# Patient Record
Sex: Female | Born: 1990 | Hispanic: No | Marital: Married | Smoking: Current every day smoker
Health system: Southern US, Community
[De-identification: ages and names within clinical notes are randomized; demographics above are authoritative.]

## PROBLEM LIST (undated history)

## (undated) DIAGNOSIS — I10 Essential (primary) hypertension: Secondary | ICD-10-CM

## (undated) DIAGNOSIS — E119 Type 2 diabetes mellitus without complications: Secondary | ICD-10-CM

## (undated) DIAGNOSIS — F988 Other specified behavioral and emotional disorders with onset usually occurring in childhood and adolescence: Secondary | ICD-10-CM

## (undated) DIAGNOSIS — J45909 Unspecified asthma, uncomplicated: Secondary | ICD-10-CM

## (undated) DIAGNOSIS — K769 Liver disease, unspecified: Secondary | ICD-10-CM

## (undated) DIAGNOSIS — F1010 Alcohol abuse, uncomplicated: Secondary | ICD-10-CM

---
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
## 2012-08-31 ENCOUNTER — Other Ambulatory Visit: Payer: Self-pay | Admitting: Family Medicine

## 2012-08-31 ENCOUNTER — Other Ambulatory Visit (HOSPITAL_COMMUNITY)
Admission: RE | Admit: 2012-08-31 | Discharge: 2012-08-31 | Disposition: A | Discharge status: Home / Self Care | Payer: BC Managed Care – PPO | Source: Ambulatory Visit | Attending: Family Medicine | Admitting: Family Medicine

## 2012-08-31 DIAGNOSIS — Z124 Encounter for screening for malignant neoplasm of cervix: Secondary | ICD-10-CM | POA: Insufficient documentation

## 2015-01-08 ENCOUNTER — Encounter (HOSPITAL_COMMUNITY): Payer: Self-pay | Admitting: Emergency Medicine

## 2015-01-08 ENCOUNTER — Emergency Department (HOSPITAL_COMMUNITY)
Admission: EM | Admit: 2015-01-08 | Discharge: 2015-01-09 | Disposition: A | Discharge status: Home / Self Care | Payer: BC Managed Care – PPO | Attending: Emergency Medicine | Admitting: Emergency Medicine

## 2015-01-08 DIAGNOSIS — J45909 Unspecified asthma, uncomplicated: Secondary | ICD-10-CM | POA: Insufficient documentation

## 2015-01-08 DIAGNOSIS — Z720 Tobacco use: Secondary | ICD-10-CM | POA: Insufficient documentation

## 2015-01-08 DIAGNOSIS — T24211A Burn of second degree of right thigh, initial encounter: Secondary | ICD-10-CM | POA: Insufficient documentation

## 2015-01-08 DIAGNOSIS — T24011A Burn of unspecified degree of right thigh, initial encounter: Secondary | ICD-10-CM | POA: Diagnosis present

## 2015-01-08 DIAGNOSIS — T2102XA Burn of unspecified degree of abdominal wall, initial encounter: Secondary | ICD-10-CM | POA: Insufficient documentation

## 2015-01-08 DIAGNOSIS — Y92009 Unspecified place in unspecified non-institutional (private) residence as the place of occurrence of the external cause: Secondary | ICD-10-CM | POA: Insufficient documentation

## 2015-01-08 DIAGNOSIS — F909 Attention-deficit hyperactivity disorder, unspecified type: Secondary | ICD-10-CM | POA: Insufficient documentation

## 2015-01-08 DIAGNOSIS — Y998 Other external cause status: Secondary | ICD-10-CM | POA: Diagnosis not present

## 2015-01-08 DIAGNOSIS — Z23 Encounter for immunization: Secondary | ICD-10-CM | POA: Diagnosis not present

## 2015-01-08 DIAGNOSIS — Z79899 Other long term (current) drug therapy: Secondary | ICD-10-CM | POA: Diagnosis not present

## 2015-01-08 DIAGNOSIS — X12XXXA Contact with other hot fluids, initial encounter: Secondary | ICD-10-CM | POA: Diagnosis not present

## 2015-01-08 DIAGNOSIS — Y9389 Activity, other specified: Secondary | ICD-10-CM | POA: Insufficient documentation

## 2015-01-08 DIAGNOSIS — T300 Burn of unspecified body region, unspecified degree: Secondary | ICD-10-CM

## 2015-01-08 DIAGNOSIS — I10 Essential (primary) hypertension: Secondary | ICD-10-CM | POA: Insufficient documentation

## 2015-01-08 HISTORY — DX: Other specified behavioral and emotional disorders with onset usually occurring in childhood and adolescence: F98.8

## 2015-01-08 HISTORY — DX: Unspecified asthma, uncomplicated: J45.909

## 2015-01-08 HISTORY — DX: Essential (primary) hypertension: I10

## 2015-01-08 MED ORDER — SILVER SULFADIAZINE 1 % EX CREA: 1.0000 "application " | TOPICAL_CREAM | Freq: Every day | CUTANEOUS | Status: DC

## 2015-01-08 MED ORDER — TETANUS-DIPHTH-ACELL PERTUSSIS 5-2.5-18.5 LF-MCG/0.5 IM SUSP
0.5000 mL | Freq: Once | INTRAMUSCULAR | Status: AC
  Administered 2015-01-09: 0.5 mL via INTRAMUSCULAR
  Filled 2015-01-08: qty 0.5

## 2015-01-08 MED ORDER — SILVER SULFADIAZINE 1 % EX CREA
TOPICAL_CREAM | Freq: Two times a day (BID) | CUTANEOUS | Status: DC
  Administered 2015-01-09: 01:00:00 via TOPICAL
  Filled 2015-01-08: qty 85

## 2015-01-08 MED ORDER — OXYCODONE-ACETAMINOPHEN 5-325 MG PO TABS
1.0000 | ORAL_TABLET | Freq: Once | ORAL | Status: AC
  Administered 2015-01-08: 1 via ORAL

## 2015-01-08 MED ORDER — OXYCODONE-ACETAMINOPHEN 5-325 MG PO TABS: 2.0000 | ORAL_TABLET | ORAL | Status: DC | PRN

## 2015-01-08 MED ORDER — OXYCODONE-ACETAMINOPHEN 5-325 MG PO TABS
ORAL_TABLET | ORAL | Status: AC
  Filled 2015-01-08: qty 1

## 2015-01-08 MED ORDER — KETOROLAC TROMETHAMINE 60 MG/2ML IM SOLN
60.0000 mg | Freq: Once | INTRAMUSCULAR | Status: AC
  Administered 2015-01-09: 60 mg via INTRAMUSCULAR
  Filled 2015-01-08: qty 2

## 2015-01-08 NOTE — Discharge Instructions (Signed)
Burn Care Your skin is a natural barrier to infection. It is the largest organ of your body. Burns damage this natural protection. To help prevent infection, it is very important to follow your caregiver's instructions in the care of your burn. Burns are classified as:  First degree. There is only redness of the skin (erythema). No scarring is expected.  Second degree. There is blistering of the skin. Scarring may occur with deeper burns.  Third degree. All layers of the skin are injured, and scarring is expected. HOME CARE INSTRUCTIONS   Wash your hands well before changing your bandage.  Change your bandage as often as directed by your caregiver.  Remove the old bandage. If the bandage sticks, you may soak it off with cool, clean water.  Cleanse the burn thoroughly but gently with mild soap and water.  Pat the area dry with a clean, dry cloth.  Apply a thin layer of antibacterial cream to the burn.  Apply a clean bandage as instructed by your caregiver.  Keep the bandage as clean and dry as possible.  Elevate the affected area for the first 24 hours, then as instructed by your caregiver.  Only take over-the-counter or prescription medicines for pain, discomfort, or fever as directed by your caregiver. SEEK IMMEDIATE MEDICAL CARE IF:   You develop excessive pain.  You develop redness, tenderness, swelling, or red streaks near the burn.  The burned area develops yellowish-white fluid (pus) or a bad smell.  You have a fever. MAKE SURE YOU:   Understand these instructions.  Will watch your condition.  Will get help right away if you are not doing well or get worse. Document Released: 06/29/2005 Document Revised: 09/21/2011 Document Reviewed: 11/19/2010 [employer] Patient Information 2015 [employer]. This information is not intended to replace advice given to you by your health care provider. Make sure you discuss any questions you have with your health care  provider.  
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
## 2015-01-09 NOTE — ED Notes (Signed)
Pt verbalized understanding of silvadene use and pain medication use and has no further questions. Pt in NAD upon d/c.

## 2015-12-05 ENCOUNTER — Emergency Department (HOSPITAL_COMMUNITY): Payer: BC Managed Care – PPO

## 2015-12-05 ENCOUNTER — Inpatient Hospital Stay (HOSPITAL_COMMUNITY)
Admission: EM | Admit: 2015-12-05 | Discharge: 2015-12-13 | DRG: 433 | Disposition: A | Discharge status: Home / Self Care | Payer: BC Managed Care – PPO | Attending: Internal Medicine | Admitting: Internal Medicine

## 2015-12-05 ENCOUNTER — Encounter (HOSPITAL_COMMUNITY): Payer: Self-pay | Admitting: Emergency Medicine

## 2015-12-05 DIAGNOSIS — K7031 Alcoholic cirrhosis of liver with ascites: Secondary | ICD-10-CM | POA: Diagnosis not present

## 2015-12-05 DIAGNOSIS — R1011 Right upper quadrant pain: Secondary | ICD-10-CM | POA: Diagnosis not present

## 2015-12-05 DIAGNOSIS — R188 Other ascites: Secondary | ICD-10-CM | POA: Diagnosis present

## 2015-12-05 DIAGNOSIS — I10 Essential (primary) hypertension: Secondary | ICD-10-CM | POA: Diagnosis present

## 2015-12-05 DIAGNOSIS — F10229 Alcohol dependence with intoxication, unspecified: Secondary | ICD-10-CM | POA: Diagnosis present

## 2015-12-05 DIAGNOSIS — R05 Cough: Secondary | ICD-10-CM

## 2015-12-05 DIAGNOSIS — K7290 Hepatic failure, unspecified without coma: Secondary | ICD-10-CM | POA: Diagnosis present

## 2015-12-05 DIAGNOSIS — K7011 Alcoholic hepatitis with ascites: Secondary | ICD-10-CM | POA: Diagnosis present

## 2015-12-05 DIAGNOSIS — E871 Hypo-osmolality and hyponatremia: Secondary | ICD-10-CM | POA: Diagnosis present

## 2015-12-05 DIAGNOSIS — K766 Portal hypertension: Secondary | ICD-10-CM | POA: Diagnosis present

## 2015-12-05 DIAGNOSIS — Y908 Blood alcohol level of 240 mg/100 ml or more: Secondary | ICD-10-CM | POA: Diagnosis present

## 2015-12-05 DIAGNOSIS — R059 Cough, unspecified: Secondary | ICD-10-CM

## 2015-12-05 DIAGNOSIS — R000 Tachycardia, unspecified: Secondary | ICD-10-CM | POA: Diagnosis present

## 2015-12-05 DIAGNOSIS — F10929 Alcohol use, unspecified with intoxication, unspecified: Secondary | ICD-10-CM

## 2015-12-05 DIAGNOSIS — E876 Hypokalemia: Secondary | ICD-10-CM | POA: Diagnosis present

## 2015-12-05 DIAGNOSIS — F1010 Alcohol abuse, uncomplicated: Secondary | ICD-10-CM | POA: Insufficient documentation

## 2015-12-05 DIAGNOSIS — K7200 Acute and subacute hepatic failure without coma: Secondary | ICD-10-CM

## 2015-12-05 DIAGNOSIS — D689 Coagulation defect, unspecified: Secondary | ICD-10-CM | POA: Diagnosis present

## 2015-12-05 DIAGNOSIS — D684 Acquired coagulation factor deficiency: Secondary | ICD-10-CM | POA: Diagnosis present

## 2015-12-05 DIAGNOSIS — F10239 Alcohol dependence with withdrawal, unspecified: Secondary | ICD-10-CM | POA: Diagnosis present

## 2015-12-05 DIAGNOSIS — F909 Attention-deficit hyperactivity disorder, unspecified type: Secondary | ICD-10-CM | POA: Diagnosis present

## 2015-12-05 DIAGNOSIS — D72829 Elevated white blood cell count, unspecified: Secondary | ICD-10-CM | POA: Diagnosis present

## 2015-12-05 LAB — URINALYSIS, ROUTINE W REFLEX MICROSCOPIC
Glucose, UA: NEGATIVE mg/dL
Hgb urine dipstick: NEGATIVE
KETONES UR: NEGATIVE mg/dL
Leukocytes, UA: NEGATIVE
NITRITE: POSITIVE — AB
PH: 6.5 (ref 5.0–8.0)
Protein, ur: NEGATIVE mg/dL
Specific Gravity, Urine: 1.014 (ref 1.005–1.030)

## 2015-12-05 LAB — URINE MICROSCOPIC-ADD ON

## 2015-12-05 LAB — I-STAT BETA HCG BLOOD, ED (MC, WL, AP ONLY)

## 2015-12-05 LAB — CBC
HCT: 32.7 % — ABNORMAL LOW (ref 36.0–46.0)
Hemoglobin: 10.9 g/dL — ABNORMAL LOW (ref 12.0–15.0)
MCH: 26.8 pg (ref 26.0–34.0)
MCHC: 33.3 g/dL (ref 30.0–36.0)
MCV: 80.5 fL (ref 78.0–100.0)
PLATELETS: 346 10*3/uL (ref 150–400)
RBC: 4.06 MIL/uL (ref 3.87–5.11)
RDW: 17.7 % — AB (ref 11.5–15.5)
WBC: 24.8 10*3/uL — AB (ref 4.0–10.5)

## 2015-12-05 LAB — HEPATIC FUNCTION PANEL
ALK PHOS: 300 U/L — AB (ref 38–126)
ALT: 11 U/L — AB (ref 14–54)
AST: 162 U/L — ABNORMAL HIGH (ref 15–41)
Albumin: 3.4 g/dL — ABNORMAL LOW (ref 3.5–5.0)
BILIRUBIN INDIRECT: 1 mg/dL — AB (ref 0.3–0.9)
Bilirubin, Direct: 1.3 mg/dL — ABNORMAL HIGH (ref 0.1–0.5)
TOTAL PROTEIN: 7.9 g/dL (ref 6.5–8.1)
Total Bilirubin: 2.3 mg/dL — ABNORMAL HIGH (ref 0.3–1.2)

## 2015-12-05 LAB — BASIC METABOLIC PANEL
Anion gap: 14 (ref 5–15)
CO2: 24 mmol/L (ref 22–32)
Calcium: 7.9 mg/dL — ABNORMAL LOW (ref 8.9–10.3)
Chloride: 100 mmol/L — ABNORMAL LOW (ref 101–111)
Creatinine, Ser: 0.39 mg/dL — ABNORMAL LOW (ref 0.44–1.00)
GFR calc Af Amer: >60 mL/min (ref >60)
GLUCOSE: 147 mg/dL — AB (ref 65–99)
POTASSIUM: 3.3 mmol/L — AB (ref 3.5–5.1)
Sodium: 138 mmol/L (ref 135–145)

## 2015-12-05 LAB — ETHANOL: Alcohol, Ethyl (B): 303 mg/dL (ref <5)

## 2015-12-05 LAB — LIPASE, BLOOD: Lipase: 60 U/L — ABNORMAL HIGH (ref 11–51)

## 2015-12-05 LAB — PROTIME-INR
INR: 1.51 — ABNORMAL HIGH (ref 0.00–1.49)
Prothrombin Time: 18.3 seconds — ABNORMAL HIGH (ref 11.6–15.2)

## 2015-12-05 MED ORDER — SODIUM CHLORIDE 0.9 % IV BOLUS (SEPSIS): 500.0000 mL | Freq: Once | INTRAVENOUS | Status: DC

## 2015-12-05 MED ORDER — SODIUM CHLORIDE 0.9 % IV BOLUS (SEPSIS): 1000.0000 mL | Freq: Once | INTRAVENOUS | Status: DC

## 2015-12-05 MED ORDER — IOPAMIDOL (ISOVUE-300) INJECTION 61%
100.0000 mL | Freq: Once | INTRAVENOUS | Status: AC | PRN
  Administered 2015-12-05: 100 mL via INTRAVENOUS

## 2015-12-05 NOTE — ED Notes (Signed)
Pt presents with very distended abdomen x 2.5 weeks. States she is a heavy drinker. SOB. Alert and oriented.

## 2015-12-05 NOTE — ED Notes (Signed)
Pt in CT.

## 2015-12-05 NOTE — ED Notes (Signed)
PA at bedside.

## 2015-12-05 NOTE — ED Provider Notes (Signed)
CSN: [id number]     Arrival date & time 12/05/15  2005 History   First MD Initiated Contact with Patient 12/05/15 2036     Chief Complaint  Patient presents with  . Abdominal Pain    Angela Villegas is a 25 y.o. female with a history of alcohol abuse who presents to the ED complaining of abdominal pain and distention for the past 2.5 weeks. Patient reports she began having abdominal pain and distention worsen her epigastric and right upper quadrant area over the past 2 and half weeks. She reports her abdomen has been getting progressively larger. She's not been evaluated for this complaint previously. She reports she drinks 10 drinks of vodka a day for a long period of time. She reports nausea but no vomiting. She last vomited 4 days ago. No previous abdominal surgeries. She reports having frequent nose bleeds recently.  No history of liver problems. She denies fevers, diarrhea, vomiting, urinary symptoms, chest pain, shortness of breath, hematemesis, hematuria or hemoptysis. or rashes.    Patient is a 25 y.o. female presenting with abdominal pain. The history is provided by the patient. No language interpreter was used.  Abdominal Pain Associated symptoms: nausea   Associated symptoms: no chest pain, no chills, no cough, no diarrhea, no dysuria, no fever, no hematuria, no shortness of breath, no sore throat, no vaginal bleeding, no vaginal discharge and no vomiting     Past Medical History  Diagnosis Date  . ADD (attention deficit disorder)   . Asthma   . Hypertension    History reviewed. No pertinent past surgical history. History reviewed. No pertinent family history. Social History  Substance Use Topics  . Smoking status: Current Every Day Smoker  . Smokeless tobacco: None  . Alcohol Use: Yes   OB History    No data available     Review of Systems  Constitutional: Negative for fever and chills.  HENT: Positive for nosebleeds. Negative for congestion and sore throat.   Eyes:  Negative for visual disturbance.  Respiratory: Negative for cough and shortness of breath.   Cardiovascular: Negative for chest pain.  Gastrointestinal: Positive for nausea, abdominal pain and abdominal distention. Negative for vomiting, diarrhea, blood in stool and anal bleeding.  Genitourinary: Negative for dysuria, urgency, hematuria, vaginal bleeding, vaginal discharge and difficulty urinating.  Musculoskeletal: Negative for back pain and neck pain.  Skin: Negative for rash.  Neurological: Negative for headaches.      Allergies  Vicodin  Home Medications   Prior to Admission medications   Medication Sig Start Date End Date Taking? Authorizing Provider  amLODipine (NORVASC) 5 MG tablet Take 5 mg by mouth daily.   Yes Historical Provider, MD  amphetamine-dextroamphetamine (ADDERALL XR) 15 MG 24 hr capsule Take 15 mg by mouth every morning.   Yes Historical Provider, MD  amphetamine-dextroamphetamine (ADDERALL) 10 MG tablet Take 10 mg by mouth every evening.  12/21/14  Yes Historical Provider, MD  fluticasone (FLONASE) 50 MCG/ACT nasal spray Place 2 sprays into both nostrils daily.   Yes Historical Provider, MD  guaifenesin (ROBITUSSIN) 100 MG/5ML syrup Take 200 mg by mouth 3 (three) times daily as needed for cough.   Yes Historical Provider, MD  propranolol (INDERAL) 10 MG tablet Take 10 mg by mouth 2 (two) times daily.   Yes Historical Provider, MD  traMADol (ULTRAM) 50 MG tablet Take 50 mg by mouth every 6 (six) hours as needed for moderate pain or severe pain.   Yes Historical Provider, MD  ibuprofen (ADVIL,MOTRIN) 200 MG tablet Take 400 mg by mouth every 6 (six) hours as needed for headache or mild pain.    Historical Provider, MD  oxyCODONE-acetaminophen (PERCOCET) 5-325 MG per tablet Take 2 tablets by mouth every 4 (four) hours as needed. Patient not taking: Reported on 12/05/2015 01/08/15   Gilda Crease, MD  silver sulfADIAZINE (SILVADENE) 1 % cream Apply 1 application  topically daily. Patient not taking: Reported on 12/05/2015 01/08/15   Gilda Crease, MD   BP 112/90 mmHg  Pulse 132  Temp(Src) 98.1 F (36.7 C) (Oral)  Resp 25  SpO2 92%  LMP 10/23/2015 (Approximate) Physical Exam  Constitutional: She is oriented to person, place, and time. She appears well-developed and well-nourished. No distress.  HENT:  Head: Normocephalic and atraumatic.  Mouth/Throat: Oropharynx is clear and moist.  Eyes: Conjunctivae are normal. Pupils are equal, round, and reactive to light. Right eye exhibits no discharge. Left eye exhibits no discharge.  Neck: Neck supple.  Cardiovascular: Regular rhythm, normal heart sounds and intact distal pulses.  Exam reveals no gallop and no friction rub.   No murmur heard. Tachycardic at 120  Pulmonary/Chest: Effort normal and breath sounds normal. No respiratory distress. She has no wheezes. She has no rales.  Lungs clear to auscultation bilaterally.  Abdominal: Soft. She exhibits distension. There is tenderness.  Abdomen is distended. Evidence of ascites and tympany to percussion. Mild tenderness to epigastrium and RUQ.   Musculoskeletal: She exhibits no edema.  Lymphadenopathy:    She has no cervical adenopathy.  Neurological: She is alert and oriented to person, place, and time. Coordination normal.  Skin: Skin is warm and dry. No rash noted. She is not diaphoretic. No erythema. No pallor.  Psychiatric: She has a normal mood and affect. Her behavior is normal.  Nursing note and vitals reviewed.   ED Course  Procedures (including critical care time) Labs Review Labs Reviewed  LIPASE, BLOOD - Abnormal; Notable for the following:    Lipase 60 (*)    All other components within normal limits  CBC - Abnormal; Notable for the following:    WBC 24.8 (*)    Hemoglobin 10.9 (*)    HCT 32.7 (*)    RDW 17.7 (*)    All other components within normal limits  URINALYSIS, ROUTINE W REFLEX MICROSCOPIC (NOT AT [facility]) -  Abnormal; Notable for the following:    Color, Urine AMBER (*)    APPearance TURBID (*)    Bilirubin Urine MODERATE (*)    Nitrite POSITIVE (*)    All other components within normal limits  ETHANOL - Abnormal; Notable for the following:    Alcohol, Ethyl (B) 303 (*)    All other components within normal limits  PROTIME-INR - Abnormal; Notable for the following:    Prothrombin Time 18.3 (*)    INR 1.51 (*)    All other components within normal limits  HEPATIC FUNCTION PANEL - Abnormal; Notable for the following:    Albumin 3.4 (*)    AST 162 (*)    ALT 11 (*)    Alkaline Phosphatase 300 (*)    Total Bilirubin 2.3 (*)    Bilirubin, Direct 1.3 (*)    Indirect Bilirubin 1.0 (*)    All other components within normal limits  BASIC METABOLIC PANEL - Abnormal; Notable for the following:    Potassium 3.3 (*)    Chloride 100 (*)    Glucose, Bld 147 (*)    BUN <5 (*)  Creatinine, Ser 0.39 (*)    Calcium 7.9 (*)    All other components within normal limits  URINE MICROSCOPIC-ADD ON - Abnormal; Notable for the following:    Squamous Epithelial / LPF TOO NUMEROUS TO COUNT (*)    Bacteria, UA MANY (*)    All other components within normal limits  CULTURE, BLOOD (ROUTINE X 2)  CULTURE, BLOOD (ROUTINE X 2)  I-STAT BETA HCG BLOOD, ED ([facility], [facility], [facility] ONLY)    Imaging Review Ct Abdomen Pelvis W Contrast  12/05/2015  CLINICAL DATA:  Abdominal distention for 2 weeks. EXAM: CT ABDOMEN AND PELVIS WITH CONTRAST TECHNIQUE: Multidetector CT imaging of the abdomen and pelvis was performed using the standard protocol following bolus administration of intravenous contrast. CONTRAST:  100mL ISOVUE-300 IOPAMIDOL (ISOVUE-300) INJECTION 61% COMPARISON:  None. FINDINGS: Lower chest: Linear atelectasis in the right greater than left lower lobe. No pleural effusion. Liver: Markedly enlarged and heterogeneous. There is diffusely decreased hepatic density. More confluent subcapsular hypodensity in the anterior right  anterior and superior anterior left lobes with mild associated volume loss. More confluent hypodensity in the caudate. Rounded fluid density structures adjacent the gallbladder fossa may be loculated fluid or more confluent hepatic hypodensity. There is blood flow in the portal veins. Hepatobiliary: Sludge in the gallbladder without calcified stone. No biliary dilatation. Pancreas: No ductal dilatation or inflammation. Spleen: Normal in size. Adrenal glands: No nodule. Kidneys: Symmetric renal enhancement.  No hydronephrosis. Stomach/Bowel: Stomach physiologically distended. There is mild small bowel thickening involving left upper quadrant bowel loops. More distal small bowel loops are decompressed. Small volume of stool throughout the colon without colonic wall thickening. The appendix is normal. Vascular/Lymphatic: No retroperitoneal adenopathy. Abdominal aorta is normal in caliber. Reproductive: Uterus and ovaries are normal for age. Bladder: Physiologically distended without wall thickening. Other: Large volume of intra-abdominal ascites.  No free air. Musculoskeletal: There are no acute or suspicious osseous abnormalities. IMPRESSION: 1. Abnormal appearance of the liver. There is hepatomegaly, heterogeneous parenchyma and diffusely decreased density. More confluent regions of hypodensity in the anterior right and superior anterior left lobes with mild associated volume loss. Overall findings consistent with advanced liver disease, and possible hepatic failure. The more confluent regions suspicious for hepatocellular necrosis. 2. Large volume intra-abdominal ascites. These results were called by telephone at the time of interpretation on 12/05/2015 at 11:34 pm to Dr. Estell HarpinZammit , who verbally acknowledged these results. Electronically Signed   By: Rubye OaksMelanie  Ehinger M.D.   On: 12/05/2015 23:36   I have personally reviewed and evaluated these images and lab results as part of my medical decision-making.   EKG  Interpretation None      Filed Vitals:   12/05/15 2232 12/05/15 2234 12/06/15 0012 12/06/15 0036  BP:  133/104 143/104 112/90  Pulse: 132  127 132  Temp:      TempSrc:      Resp:    25  SpO2: 93%   92%     MDM   Meds given in ED:  Medications  iopamidol (ISOVUE-300) 61 % injection 100 mL (100 mLs Intravenous Contrast Given 12/05/15 2305)  piperacillin-tazobactam (ZOSYN) IVPB 3.375 g (0 g Intravenous Stopped 12/06/15 0045)    New Prescriptions   No medications on file    Final diagnoses:  Acute liver failure without hepatic coma  Acute alcohol intoxication, with unspecified complication (HCC)  Ascites   This is a 25 y.o. female with a history of alcohol abuse who presents to the ED complaining of  abdominal pain and distention for the past 2.5 weeks. Patient reports she began having abdominal pain and distention worsen her epigastric and right upper quadrant area over the past 2 and half weeks. She reports her abdomen has been getting progressively larger. She's not been evaluated for this complaint previously. She reports she drinks 10 drinks of vodka a day for a long period of time. She reports nausea but no vomiting. She last vomited 4 days ago. No previous abdominal surgeries. She reports having frequent nose bleeds recently.  No history of liver problems.  On exam the patient is nontoxic. She has massive abdominal distention with ascites. She is acutely intoxicated with an alcohol level of 303. She has a negative pregnancy test. She has a leukocytosis with a white count of 24,000. She has a nitrate positive urine with negative leukocytes. Urine sent for culture. Her INR is elevated at 1.51. Hepatic function panel shows a total bilirubin of 2.3. Alkaline phosphatase is elevated at 300. CT abdomen and pelvis reveals abnormal appearance of the liver. Hepatomegaly. Large volume ascites. Findings consistent with hepatic failure. Will start on Zosyn. Blood cultures were able to be  obtained before antibiotics were started.  I consulted for admission with Dr. Toniann Fail who accepted the patient for admission.   This patient was discussed with Dr. Estell Harpin who agrees with assessment and plan.    Everlene Farrier, PA-C 12/06/15 0157  Bethann Berkshire, MD 12/07/15 [phone]

## 2015-12-06 ENCOUNTER — Inpatient Hospital Stay (HOSPITAL_COMMUNITY): Payer: BC Managed Care – PPO

## 2015-12-06 ENCOUNTER — Encounter (HOSPITAL_COMMUNITY): Payer: Self-pay | Admitting: Internal Medicine

## 2015-12-06 DIAGNOSIS — K7010 Alcoholic hepatitis without ascites: Secondary | ICD-10-CM | POA: Insufficient documentation

## 2015-12-06 DIAGNOSIS — R188 Other ascites: Secondary | ICD-10-CM | POA: Diagnosis not present

## 2015-12-06 DIAGNOSIS — D72829 Elevated white blood cell count, unspecified: Secondary | ICD-10-CM | POA: Diagnosis present

## 2015-12-06 DIAGNOSIS — D689 Coagulation defect, unspecified: Secondary | ICD-10-CM | POA: Diagnosis not present

## 2015-12-06 DIAGNOSIS — R7989 Other specified abnormal findings of blood chemistry: Secondary | ICD-10-CM | POA: Diagnosis not present

## 2015-12-06 DIAGNOSIS — F10929 Alcohol use, unspecified with intoxication, unspecified: Secondary | ICD-10-CM | POA: Insufficient documentation

## 2015-12-06 DIAGNOSIS — F10239 Alcohol dependence with withdrawal, unspecified: Secondary | ICD-10-CM | POA: Diagnosis present

## 2015-12-06 DIAGNOSIS — F10229 Alcohol dependence with intoxication, unspecified: Secondary | ICD-10-CM | POA: Diagnosis present

## 2015-12-06 DIAGNOSIS — D684 Acquired coagulation factor deficiency: Secondary | ICD-10-CM | POA: Diagnosis present

## 2015-12-06 DIAGNOSIS — K7011 Alcoholic hepatitis with ascites: Secondary | ICD-10-CM | POA: Diagnosis not present

## 2015-12-06 DIAGNOSIS — K766 Portal hypertension: Secondary | ICD-10-CM | POA: Diagnosis present

## 2015-12-06 DIAGNOSIS — Y908 Blood alcohol level of 240 mg/100 ml or more: Secondary | ICD-10-CM | POA: Diagnosis present

## 2015-12-06 DIAGNOSIS — K7031 Alcoholic cirrhosis of liver with ascites: Secondary | ICD-10-CM | POA: Diagnosis present

## 2015-12-06 DIAGNOSIS — K7200 Acute and subacute hepatic failure without coma: Secondary | ICD-10-CM | POA: Diagnosis not present

## 2015-12-06 DIAGNOSIS — F10129 Alcohol abuse with intoxication, unspecified: Secondary | ICD-10-CM | POA: Diagnosis not present

## 2015-12-06 DIAGNOSIS — I10 Essential (primary) hypertension: Secondary | ICD-10-CM | POA: Diagnosis present

## 2015-12-06 DIAGNOSIS — R000 Tachycardia, unspecified: Secondary | ICD-10-CM | POA: Diagnosis present

## 2015-12-06 DIAGNOSIS — R1011 Right upper quadrant pain: Secondary | ICD-10-CM | POA: Diagnosis present

## 2015-12-06 DIAGNOSIS — F1010 Alcohol abuse, uncomplicated: Secondary | ICD-10-CM | POA: Insufficient documentation

## 2015-12-06 DIAGNOSIS — K7290 Hepatic failure, unspecified without coma: Secondary | ICD-10-CM | POA: Diagnosis present

## 2015-12-06 DIAGNOSIS — E871 Hypo-osmolality and hyponatremia: Secondary | ICD-10-CM | POA: Diagnosis present

## 2015-12-06 DIAGNOSIS — F909 Attention-deficit hyperactivity disorder, unspecified type: Secondary | ICD-10-CM | POA: Diagnosis present

## 2015-12-06 DIAGNOSIS — E876 Hypokalemia: Secondary | ICD-10-CM | POA: Diagnosis present

## 2015-12-06 LAB — CBC
HCT: 30.5 % — ABNORMAL LOW (ref 36.0–46.0)
Hemoglobin: 10.1 g/dL — ABNORMAL LOW (ref 12.0–15.0)
MCH: 26.9 pg (ref 26.0–34.0)
MCHC: 33.1 g/dL (ref 30.0–36.0)
MCV: 81.1 fL (ref 78.0–100.0)
PLATELETS: 337 10*3/uL (ref 150–400)
RBC: 3.76 MIL/uL — AB (ref 3.87–5.11)
RDW: 18.3 % — AB (ref 11.5–15.5)
WBC: 20.3 10*3/uL — AB (ref 4.0–10.5)

## 2015-12-06 LAB — RAPID URINE DRUG SCREEN, HOSP PERFORMED
AMPHETAMINES: NOT DETECTED
BENZODIAZEPINES: POSITIVE — AB
Barbiturates: NOT DETECTED
Cocaine: NOT DETECTED
OPIATES: POSITIVE — AB
TETRAHYDROCANNABINOL: NOT DETECTED

## 2015-12-06 LAB — BASIC METABOLIC PANEL
Anion gap: 11 (ref 5–15)
CALCIUM: 7.6 mg/dL — AB (ref 8.9–10.3)
CHLORIDE: 101 mmol/L (ref 101–111)
CO2: 25 mmol/L (ref 22–32)
CREATININE: 0.31 mg/dL — AB (ref 0.44–1.00)
GFR calc Af Amer: >60 mL/min (ref >60)
GFR calc non Af Amer: >60 mL/min (ref >60)
Glucose, Bld: 108 mg/dL — ABNORMAL HIGH (ref 65–99)
Potassium: 3.7 mmol/L (ref 3.5–5.1)
SODIUM: 137 mmol/L (ref 135–145)

## 2015-12-06 LAB — HEPATIC FUNCTION PANEL
ALT: 11 U/L — AB (ref 14–54)
AST: 137 U/L — AB (ref 15–41)
Albumin: 3.1 g/dL — ABNORMAL LOW (ref 3.5–5.0)
Alkaline Phosphatase: 256 U/L — ABNORMAL HIGH (ref 38–126)
BILIRUBIN DIRECT: 1.4 mg/dL — AB (ref 0.1–0.5)
BILIRUBIN INDIRECT: 1.2 mg/dL — AB (ref 0.3–0.9)
TOTAL PROTEIN: 7.1 g/dL (ref 6.5–8.1)
Total Bilirubin: 2.6 mg/dL — ABNORMAL HIGH (ref 0.3–1.2)

## 2015-12-06 LAB — GRAM STAIN

## 2015-12-06 LAB — GLUCOSE, SEROUS FLUID: Glucose, Fluid: 113 mg/dL

## 2015-12-06 LAB — BODY FLUID CELL COUNT WITH DIFFERENTIAL
Eos, Fluid: 0 %
Lymphs, Fluid: 55 %
Monocyte-Macrophage-Serous Fluid: 39 % — ABNORMAL LOW (ref 50–90)
NEUTROPHIL FLUID: 6 % (ref 0–25)
WBC FLUID: 236 uL (ref 0–1000)

## 2015-12-06 LAB — ALBUMIN, FLUID (OTHER): Albumin, Fluid: 2.2 g/dL

## 2015-12-06 LAB — PROTEIN, BODY FLUID: Total protein, fluid: 4.6 g/dL

## 2015-12-06 LAB — LACTATE DEHYDROGENASE, PLEURAL OR PERITONEAL FLUID: LD, Fluid: 141 U/L — ABNORMAL HIGH (ref 3–23)

## 2015-12-06 LAB — MRSA PCR SCREENING: MRSA by PCR: POSITIVE — AB

## 2015-12-06 MED ORDER — ONDANSETRON HCL 4 MG PO TABS
4.0000 mg | ORAL_TABLET | Freq: Four times a day (QID) | ORAL | Status: DC | PRN
  Administered 2015-12-10: 4 mg via ORAL
  Filled 2015-12-06: qty 1

## 2015-12-06 MED ORDER — ADULT MULTIVITAMIN W/MINERALS CH
1.0000 | ORAL_TABLET | Freq: Every day | ORAL | Status: DC
  Administered 2015-12-06 – 2015-12-13 (×8): 1 via ORAL
  Filled 2015-12-06 (×8): qty 1

## 2015-12-06 MED ORDER — LORAZEPAM 2 MG/ML IJ SOLN
0.0000 mg | Freq: Four times a day (QID) | INTRAMUSCULAR | Status: AC
  Administered 2015-12-06 – 2015-12-07 (×3): 2 mg via INTRAVENOUS
  Filled 2015-12-06 (×3): qty 1

## 2015-12-06 MED ORDER — MORPHINE SULFATE (PF) 2 MG/ML IV SOLN
2.0000 mg | INTRAVENOUS | Status: DC | PRN
  Administered 2015-12-06 – 2015-12-08 (×6): 2 mg via INTRAVENOUS
  Filled 2015-12-06 (×6): qty 1

## 2015-12-06 MED ORDER — LORAZEPAM 1 MG PO TABS
1.0000 mg | ORAL_TABLET | Freq: Four times a day (QID) | ORAL | Status: AC | PRN
  Administered 2015-12-07: 1 mg via ORAL
  Filled 2015-12-06: qty 1

## 2015-12-06 MED ORDER — PROPRANOLOL HCL 10 MG PO TABS: 10.0000 mg | ORAL_TABLET | Freq: Two times a day (BID) | ORAL | Status: DC

## 2015-12-06 MED ORDER — SODIUM CHLORIDE 0.9% FLUSH
3.0000 mL | Freq: Two times a day (BID) | INTRAVENOUS | Status: DC
  Administered 2015-12-06 – 2015-12-12 (×13): 3 mL via INTRAVENOUS

## 2015-12-06 MED ORDER — FLUTICASONE PROPIONATE 50 MCG/ACT NA SUSP
2.0000 | Freq: Every day | NASAL | Status: DC
Start: 2015-12-06 — End: 2015-12-13
  Filled 2015-12-06: qty 16

## 2015-12-06 MED ORDER — MUPIROCIN 2 % EX OINT
1.0000 "application " | TOPICAL_OINTMENT | Freq: Two times a day (BID) | CUTANEOUS | Status: AC
  Administered 2015-12-06 – 2015-12-10 (×10): 1 via NASAL
  Filled 2015-12-06 (×3): qty 22

## 2015-12-06 MED ORDER — LORAZEPAM 2 MG/ML IJ SOLN
0.0000 mg | Freq: Two times a day (BID) | INTRAMUSCULAR | Status: AC
  Administered 2015-12-08 – 2015-12-10 (×4): 1 mg via INTRAVENOUS
  Filled 2015-12-06 (×4): qty 1

## 2015-12-06 MED ORDER — AMLODIPINE BESYLATE 5 MG PO TABS: 5.0000 mg | ORAL_TABLET | Freq: Every day | ORAL | Status: DC

## 2015-12-06 MED ORDER — DEXTROSE 5 % IV SOLN
2.0000 g | INTRAVENOUS | Status: DC
  Administered 2015-12-06 – 2015-12-08 (×3): 2 g via INTRAVENOUS
  Filled 2015-12-06 (×5): qty 2

## 2015-12-06 MED ORDER — VITAMIN B-1 100 MG PO TABS
100.0000 mg | ORAL_TABLET | Freq: Every day | ORAL | Status: DC
  Administered 2015-12-06 – 2015-12-13 (×8): 100 mg via ORAL
  Filled 2015-12-06 (×8): qty 1

## 2015-12-06 MED ORDER — VITAMINS A & D EX OINT
TOPICAL_OINTMENT | CUTANEOUS | Status: AC
  Administered 2015-12-06: 1
  Filled 2015-12-06: qty 5

## 2015-12-06 MED ORDER — PROPRANOLOL HCL 10 MG PO TABS
10.0000 mg | ORAL_TABLET | Freq: Two times a day (BID) | ORAL | Status: DC
  Administered 2015-12-06 – 2015-12-13 (×14): 10 mg via ORAL
  Filled 2015-12-06 (×16): qty 1

## 2015-12-06 MED ORDER — LORAZEPAM 2 MG/ML IJ SOLN
1.0000 mg | Freq: Four times a day (QID) | INTRAMUSCULAR | Status: AC | PRN
Start: 2015-12-06 — End: 2015-12-09
  Administered 2015-12-06 – 2015-12-08 (×3): 1 mg via INTRAVENOUS
  Filled 2015-12-06 (×3): qty 1

## 2015-12-06 MED ORDER — AMPHETAMINE-DEXTROAMPHETAMINE 10 MG PO TABS: 10.0000 mg | ORAL_TABLET | Freq: Every evening | ORAL | Status: DC

## 2015-12-06 MED ORDER — THIAMINE HCL 100 MG/ML IJ SOLN: 100.0000 mg | Freq: Every day | INTRAMUSCULAR | Status: DC

## 2015-12-06 MED ORDER — FOLIC ACID 1 MG PO TABS
1.0000 mg | ORAL_TABLET | Freq: Every day | ORAL | Status: DC
  Administered 2015-12-06 – 2015-12-13 (×8): 1 mg via ORAL
  Filled 2015-12-06 (×9): qty 1

## 2015-12-06 MED ORDER — AMPHETAMINE-DEXTROAMPHET ER 5 MG PO CP24
15.0000 mg | ORAL_CAPSULE | Freq: Every day | ORAL | Status: DC
  Filled 2015-12-06 (×2): qty 3

## 2015-12-06 MED ORDER — PNEUMOCOCCAL VAC POLYVALENT 25 MCG/0.5ML IJ INJ
0.5000 mL | INJECTION | INTRAMUSCULAR | Status: AC
  Administered 2015-12-07: 0.5 mL via INTRAMUSCULAR
  Filled 2015-12-06 (×2): qty 0.5

## 2015-12-06 MED ORDER — CHLORHEXIDINE GLUCONATE CLOTH 2 % EX PADS
6.0000 | MEDICATED_PAD | Freq: Every day | CUTANEOUS | Status: AC
  Administered 2015-12-07 – 2015-12-08 (×2): 6 via TOPICAL

## 2015-12-06 MED ORDER — PIPERACILLIN-TAZOBACTAM 3.375 G IVPB
3.3750 g | Freq: Three times a day (TID) | INTRAVENOUS | Status: DC
  Administered 2015-12-06: 3.375 g via INTRAVENOUS
  Filled 2015-12-06: qty 50

## 2015-12-06 MED ORDER — LORAZEPAM 2 MG/ML IJ SOLN
2.0000 mg | Freq: Once | INTRAMUSCULAR | Status: AC
  Administered 2015-12-06: 2 mg via INTRAVENOUS
  Filled 2015-12-06: qty 1

## 2015-12-06 MED ORDER — VITAMINS A & D EX OINT
TOPICAL_OINTMENT | CUTANEOUS | Status: DC | PRN
  Administered 2015-12-09: 03:00:00 via TOPICAL
  Filled 2015-12-06: qty 5

## 2015-12-06 MED ORDER — ONDANSETRON HCL 4 MG/2ML IJ SOLN
4.0000 mg | Freq: Four times a day (QID) | INTRAMUSCULAR | Status: DC | PRN
Start: 2015-12-06 — End: 2015-12-13
  Administered 2015-12-07 – 2015-12-11 (×8): 4 mg via INTRAVENOUS
  Filled 2015-12-06 (×8): qty 2

## 2015-12-06 MED ORDER — PIPERACILLIN-TAZOBACTAM 3.375 G IVPB 30 MIN
3.3750 g | Freq: Once | INTRAVENOUS | Status: AC
  Administered 2015-12-06: 3.375 g via INTRAVENOUS
  Filled 2015-12-06: qty 50

## 2015-12-06 NOTE — ED Notes (Signed)
Pt ambulated to restroom. 

## 2015-12-06 NOTE — Progress Notes (Signed)
1. Pt is complaining of a localized pain in her RUQ abdomen. Describes as achy and rates it a 6/10. Also complains of back pain (chronic), aching, and 6/10. No pain med currently ordered. MD notified.  2. Temperature: 100. 8 F. MD notified.

## 2015-12-06 NOTE — Progress Notes (Signed)
[facility] PROGRESS NOTE    Progress Note  Angela Villegas  MVH:[id number] DOB: 16-Jan-1991 DOA: 12/05/2015 PCP: Frederich ChickWEBB, CAROL D, MD     Brief Narrative:   Angela Furnacereeta Mcbrien is an 25 y.o. female past medical history a lot all views that comes in for 2 weeks of increased abdominal pain and distention with right upper quadrant tenderness. She started having nausea and vomiting prior to admission CT scan of the abdomen and pelvis was done that showed possible hepatic necrosis was large volume ascites  Assessment/Plan:   Ascites/leukocytosis: Probably due from portal hypertension due to cirrhosis. She was started empirically on antibiotics for SBP, change abx to rocephin. ultrasound-guided paracentesis was ordered for cell count and Gram stain and cultures. Her MELD score 14. Blood cultures and ascites fluid cultures are pending. Her leukocytosis has improved. DC Lovenox please SCDs.  Hepatic failure (HCC)/ Alcoholic hepatitis/Coagulopathy (HCC) PT and INR is 18.3 and 1.5.  Alcoholic cirrhosis (HCC) Continue to monitor with CIWA. Her last score was 9, her last drink was about 18 hours prior to admission, her alcohol level was 300. Continue thiamine and folate. Acute hepatitis panel, UDS and pregnancy test are pending.  Hypokalemia: It was repleted orally now resolved. ADHD: Continue Adderall. DVT prophylaxis: SCDs Family Communication:none Disposition Plan/Barrier to D/C: home in 3-4 days Code Status:     Code Status Orders        Start     Ordered   12/06/15 0307  Full code   Continuous     12/06/15 0307    Code Status History    Date Active Date Inactive Code Status Order ID Comments User Context   This patient has a current code status but no historical code status.        IV Access:    Peripheral IV   Procedures and diagnostic studies:   Ct Abdomen Pelvis W Contrast  12/05/2015  CLINICAL DATA:  Abdominal distention for 2 weeks. EXAM: CT ABDOMEN AND  PELVIS WITH CONTRAST TECHNIQUE: Multidetector CT imaging of the abdomen and pelvis was performed using the standard protocol following bolus administration of intravenous contrast. CONTRAST:  100mL ISOVUE-300 IOPAMIDOL (ISOVUE-300) INJECTION 61% COMPARISON:  None. FINDINGS: Lower chest: Linear atelectasis in the right greater than left lower lobe. No pleural effusion. Liver: Markedly enlarged and heterogeneous. There is diffusely decreased hepatic density. More confluent subcapsular hypodensity in the anterior right anterior and superior anterior left lobes with mild associated volume loss. More confluent hypodensity in the caudate. Rounded fluid density structures adjacent the gallbladder fossa may be loculated fluid or more confluent hepatic hypodensity. There is blood flow in the portal veins. Hepatobiliary: Sludge in the gallbladder without calcified stone. No biliary dilatation. Pancreas: No ductal dilatation or inflammation. Spleen: Normal in size. Adrenal glands: No nodule. Kidneys: Symmetric renal enhancement.  No hydronephrosis. Stomach/Bowel: Stomach physiologically distended. There is mild small bowel thickening involving left upper quadrant bowel loops. More distal small bowel loops are decompressed. Small volume of stool throughout the colon without colonic wall thickening. The appendix is normal. Vascular/Lymphatic: No retroperitoneal adenopathy. Abdominal aorta is normal in caliber. Reproductive: Uterus and ovaries are normal for age. Bladder: Physiologically distended without wall thickening. Other: Large volume of intra-abdominal ascites.  No free air. Musculoskeletal: There are no acute or suspicious osseous abnormalities. IMPRESSION: 1. Abnormal appearance of the liver. There is hepatomegaly, heterogeneous parenchyma and diffusely decreased density. More confluent regions of hypodensity in the anterior right and superior anterior left lobes with mild associated volume  loss. Overall findings  consistent with advanced liver disease, and possible hepatic failure. The more confluent regions suspicious for hepatocellular necrosis. 2. Large volume intra-abdominal ascites. These results were called by telephone at the time of interpretation on 12/05/2015 at 11:34 pm to Dr. Estell Harpin , who verbally acknowledged these results. Electronically Signed   By: Rubye Oaks M.D.   On: 12/05/2015 23:36   Dg Chest Port 1 View  12/06/2015  CLINICAL DATA:  Cough and shortness of breath. Abdominal distension. EXAM: PORTABLE CHEST 1 VIEW COMPARISON:  None. FINDINGS: Lung volumes are low. The cardiomediastinal contours are normal. Pulmonary vasculature is normal. No consolidation, pleural effusion, or pneumothorax. No acute osseous abnormalities are seen. IMPRESSION: Low lung volumes without acute process. Electronically Signed   By: Rubye Oaks M.D.   On: 12/06/2015 04:53     Medical Consultants:    None.  Anti-Infectives:   Rocephin on 5.26.2017  Subjective:    Angela Villegas she still complaining of abdominal tightness, still mildly nauseated.  Objective:    Filed Vitals:   12/06/15 0350 12/06/15 0400 12/06/15 0500 12/06/15 0600  BP:  114/76 119/79 123/73  Pulse: 133 131 131 135  Temp: 99.9 F (37.7 C)     TempSrc: Oral     Resp: Height:  (1.549 m)     Weight: 69 kg (152 lb 1.9 oz)     SpO2: 96% 97% 97% 89%    Intake/Output Summary (Last 24 hours) at 12/06/15 0746 Last data filed at 12/06/15 1610  Gross per 24 hour  Intake   15.5 ml  Output      0 ml  Net   15.5 ml   Filed Weights   12/06/15 0349 12/06/15 0350  Weight: 69 kg (152 lb 1.9 oz) 69 kg (152 lb 1.9 oz)    Exam: General exam: In no acute distress. Respiratory system: Good air movement and clear to auscultation. Cardiovascular system: S1 & S2 heard, RRR. No JVD, murmurs, rubs, gallops or clicks.  Gastrointestinal system: Abdomen is Significantly distended, soft and diffuse  tenderness Central nervous system: Alert and oriented. No focal neurological deficits. Extremities: No pedal edema. Skin: No rashes, lesions or ulcers Psychiatry: Judgement and insight appear normal. Mood & affect appropriate.    Data Reviewed:    Labs: Basic Metabolic Panel:  Recent Labs Lab 12/05/15 2057 12/06/15 0537  NA 138 137  K 3.3* 3.7  CL 100* 101  CO2 24 25  GLUCOSE 147* 108*  BUN <5* <5*  CREATININE 0.39* 0.31*  CALCIUM 7.9* 7.6*   GFR Estimated Creatinine Clearance: 95.5 mL/min (by C-G formula based on Cr of 0.31). Liver Function Tests:  Recent Labs Lab 12/05/15 2057 12/06/15 0537  AST 162* 137*  ALT 11* 11*  ALKPHOS 300* 256*  BILITOT 2.3* 2.6*  PROT 7.9 7.1  ALBUMIN 3.4* 3.1*    Recent Labs Lab 12/05/15 2057  LIPASE 60*   No results for input(s): AMMONIA in the last 168 hours. Coagulation profile  Recent Labs Lab 12/05/15 2057  INR 1.51*    CBC:  Recent Labs Lab 12/05/15 2057 12/06/15 0537  WBC 24.8* 20.3*  HGB 10.9* 10.1*  HCT 32.7* 30.5*  MCV 80.5 81.1  PLT 346 337   Cardiac Enzymes: No results for input(s): CKTOTAL, CKMB, CKMBINDEX, TROPONINI in the last 168 hours. BNP (last 3 results) No results for input(s): PROBNP in the last 8760 hours. CBG: No results for input(s): GLUCAP in the last  168 hours. D-Dimer: No results for input(s): DDIMER in the last 72 hours. Hgb A1c: No results for input(s): HGBA1C in the last 72 hours. Lipid Profile: No results for input(s): CHOL, HDL, LDLCALC, TRIG, CHOLHDL, LDLDIRECT in the last 72 hours. Thyroid function studies: No results for input(s): TSH, T4TOTAL, T3FREE, THYROIDAB in the last 72 hours.  Invalid input(s): FREET3 Anemia work up: No results for input(s): VITAMINB12, FOLATE, FERRITIN, TIBC, IRON, RETICCTPCT in the last 72 hours. Sepsis Labs:  Recent Labs Lab 12/05/15 2057 12/06/15 0537  WBC 24.8* 20.3*   Microbiology Recent Results (from the past 240 hour(s))   MRSA PCR Screening     Status: Abnormal   Collection Time: 12/06/15  4:06 AM  Result Value Ref Range Status   MRSA by PCR POSITIVE (A) NEGATIVE Final    Comment:        The GeneXpert MRSA Assay (FDA approved for NASAL specimens only), is one component of a comprehensive MRSA colonization surveillance program. It is not intended to diagnose MRSA infection nor to guide or monitor treatment for MRSA infections. RESULT CALLED TO, READ BACK BY AND VERIFIED WITH: Q MBEMENA RN @ [phone] ON 12/06/15 BY C DAVIS      Medications:   . amphetamine-dextroamphetamine  15 mg Oral QAC breakfast  . amphetamine-dextroamphetamine  10 mg Oral QPM  . Chlorhexidine Gluconate Cloth  6 each Topical Q0600  . fluticasone  2 spray Each Nare Daily  . folic acid  1 mg Oral Daily  . LORazepam  0-4 mg Intravenous Q6H   Followed by  . [START ON 12/08/2015] LORazepam  0-4 mg Intravenous Q12H  . multivitamin with minerals  1 tablet Oral Daily  . mupirocin ointment  1 application Nasal BID  . piperacillin-tazobactam (ZOSYN)  IV  3.375 g Intravenous Q8H  . [START ON 12/07/2015] pneumococcal 23 valent vaccine  0.5 mL Intramuscular Tomorrow-1000  . sodium chloride flush  3 mL Intravenous Q12H  . thiamine  100 mg Oral Daily   Or  . thiamine  100 mg Intravenous Daily   Continuous Infusions:   Time spent: 25 min   LOS: 0 days   Marinda Elk  [facility] Pager [phone]  *Please refer to [email or web address], password [email or web address] to get updated schedule on who will round on this patient, as hospitalists switch teams weekly. If 7PM-7AM, please contact night-coverage at [email or web address], password [email or web address] for any overnight needs.  12/06/2015, 7:46 AM

## 2015-12-06 NOTE — Progress Notes (Signed)
Pharmacy Antibiotic Note  Angela Villegas is a 25 y.o. female admitted on 12/05/2015 with Empiric coverage (SBP).  Pharmacy has been consulted for zosyn dosing.  Plan: Zosyn 3.375g IV q8h (4 hour infusion).  Height: 5\' 1"  (154.9 cm) Weight: 152 lb 1.9 oz (69 kg) IBW/kg (Calculated) : 47.8  Temp (24hrs), Avg:99 F (37.2 C), Min:98.1 F (36.7 C), Max:99.9 F (37.7 C)   Recent Labs Lab 12/05/15 2057  WBC 24.8*  CREATININE 0.39*    Estimated Creatinine Clearance: 95.5 mL/min (by C-G formula based on Cr of 0.39).    Allergies  Allergen Reactions  . Vicodin [Hydrocodone-Acetaminophen] Itching and Nausea Only    Antimicrobials this admission: Zosyn 12/06/2015 >>  Dose adjustments this admission: -  Microbiology results: Pending  Thank you for allowing pharmacy to be a part of this patient's care.  Aleene DavidsonGrimsley Jr, Jori Thrall Crowford 12/06/2015 5:25 AM

## 2015-12-06 NOTE — ED Notes (Signed)
PA at bedside.

## 2015-12-06 NOTE — Progress Notes (Signed)
Returned from US s/p paracentesis.  Site looks dry and intact; no blood or drainage noted from site located @ [facility] Abd. bandaid in place.     12/06/15 1500  Vitals  BP 117/74 mmHg  MAP (mmHg) 87  Pulse Rate (!) 110  ECG Heart Rate (!) 111  Resp 18  Oxygen Therapy  SpO2 96 %  O2 Device Nasal Cannula  O2 Flow Rate (L/min) 2 L/min  Pain Assessment  Pain Assessment 0-10  Pain Score 5  Pain Type Chronic pain  Pain Location Back  Pain Intervention(s) Medication (See eMAR)

## 2015-12-06 NOTE — ED Notes (Signed)
4th floor stated that they would not approve bed until hospitalist saw pt

## 2015-12-06 NOTE — Procedures (Signed)
Ultrasound-guided diagnostic and therapeutic paracentesis performed yielding 4.8 liters of golden yellow  fluid. No immediate complications. A portion of the fluid was sent to the lab for preordered studies.

## 2015-12-06 NOTE — ED Notes (Signed)
Antibiotic paused when blood cultures were ordered; about 20mL of saline had entered IV catheter at this time

## 2015-12-06 NOTE — H&P (Signed)
History and Physical    Angela Villegas WUJ:[id number] DOB: 02-28-1991 DOA: 12/05/2015  PCP: Frederich Chick, MD  Patient coming from: Home.  Chief Complaint: Abdominal pain and distention.  HPI: Angela Villegas is a 25 y.o. female with medical history significant of alcoholism, ADHD presents to the ER because of increasing abdominal pain in the right upper quadrant with abdominal distention. Patient states she drinks Vodka every day for last 4 years and notices that over the last 2-3 weeks patient has been having increasing abdominal distention and increasing pain in the right upper quadrant over the last 1 week. Patient has been having diarrhea alternating with constipation and had tried over-the-counter laxatives. Patient had 2 episodes of nausea vomiting yesterday but no blood in it. In the ER patient was found to have started abdomen. CT abdomen and pelvis shows ascites with abnormalities in the liver concerning for focal areas of necrosis. LFTs are mildly elevated. Patient has been admitted for further management of ascites probably from developing cirrhosis. Patient also hasn't had some cough. Denies any shortness of breath or chest pain. Patient's lab work show in addition to the elevated LFTs and mildly elevated INR leukocytosis. Patient before my exam became tachycardic and tremulous and I have to give 2 mg of IV Ativan for alcohol withdrawal.  ED Course: Was started on antibiotics after blood cultures were obtained.  Review of Systems: As per HPI otherwise 10 point review of systems negative.    Past Medical History  Diagnosis Date  . ADD (attention deficit disorder)   . Asthma   . Hypertension     History reviewed. No pertinent past surgical history.   reports that she has been smoking.  She does not have any smokeless tobacco history on file. She reports that she drinks alcohol. She reports that she does not use illicit drugs.  Allergies  Allergen Reactions  . Vicodin  [Hydrocodone-Acetaminophen] Itching and Nausea Only    Family History  Problem Relation Age of Onset  . GER disease Mother     Prior to Admission medications   Medication Sig Start Date End Date Taking? Authorizing Provider  amLODipine (NORVASC) 5 MG tablet Take 5 mg by mouth daily.   Yes Historical Provider, MD  amphetamine-dextroamphetamine (ADDERALL XR) 15 MG 24 hr capsule Take 15 mg by mouth every morning.   Yes Historical Provider, MD  amphetamine-dextroamphetamine (ADDERALL) 10 MG tablet Take 10 mg by mouth every evening.  12/21/14  Yes Historical Provider, MD  fluticasone (FLONASE) 50 MCG/ACT nasal spray Place 2 sprays into both nostrils daily.   Yes Historical Provider, MD  guaifenesin (ROBITUSSIN) 100 MG/5ML syrup Take 200 mg by mouth 3 (three) times daily as needed for cough.   Yes Historical Provider, MD  propranolol (INDERAL) 10 MG tablet Take 10 mg by mouth 2 (two) times daily.   Yes Historical Provider, MD  traMADol (ULTRAM) 50 MG tablet Take 50 mg by mouth every 6 (six) hours as needed for moderate pain or severe pain.   Yes Historical Provider, MD  ibuprofen (ADVIL,MOTRIN) 200 MG tablet Take 400 mg by mouth every 6 (six) hours as needed for headache or mild pain.    Historical Provider, MD  oxyCODONE-acetaminophen (PERCOCET) 5-325 MG per tablet Take 2 tablets by mouth every 4 (four) hours as needed. Patient not taking: Reported on 12/05/2015 01/08/15   Gilda Crease, MD  silver sulfADIAZINE (SILVADENE) 1 % cream Apply 1 application topically daily. Patient not taking: Reported on 12/05/2015 01/08/15  Gilda Creasehristopher J Pollina, MD    Physical Exam: Filed Vitals:   12/06/15 0036 12/06/15 0100 12/06/15 0130 12/06/15 0154  BP: 112/90 116/82 127/89 127/89  Pulse: 132  132 129  Temp:      TempSrc:      Resp: 25  21 21   SpO2: 92%  94% 91%      Constitutional: Not in distress. Filed Vitals:   12/06/15 0036 12/06/15 0100 12/06/15 0130 12/06/15 0154  BP: 112/90 116/82  127/89 127/89  Pulse: 132  132 129  Temp:      TempSrc:      Resp: 25  21 21   SpO2: 92%  94% 91%   Eyes: Anicteric no pallor. ENMT: No discharge from the ears eyes nose and mouth. Neck: No mass felt. No JVD appreciated. Respiratory: No rhonchi or crepitations. Cardiovascular: S1 and S2 heard. Abdomen: Distended nontender bowel sounds present no guarding or rigidity. Musculoskeletal: No edema. Skin: No rash. Neurologic: Alert awake oriented to time place and person. Moves all extremities. Psychiatric: Appears normal.   Labs on Admission: I have personally reviewed following labs and imaging studies  CBC:  Recent Labs Lab 12/05/15 2057  WBC 24.8*  HGB 10.9*  HCT 32.7*  MCV 80.5  PLT 346   Basic Metabolic Panel:  Recent Labs Lab 12/05/15 2057  NA 138  K 3.3*  CL 100*  CO2 24  GLUCOSE 147*  BUN <5*  CREATININE 0.39*  CALCIUM 7.9*   GFR: CrCl cannot be calculated (Unknown ideal weight.). Liver Function Tests:  Recent Labs Lab 12/05/15 2057  AST 162*  ALT 11*  ALKPHOS 300*  BILITOT 2.3*  PROT 7.9  ALBUMIN 3.4*    Recent Labs Lab 12/05/15 2057  LIPASE 60*   No results for input(s): AMMONIA in the last 168 hours. Coagulation Profile:  Recent Labs Lab 12/05/15 2057  INR 1.51*   Cardiac Enzymes: No results for input(s): CKTOTAL, CKMB, CKMBINDEX, TROPONINI in the last 168 hours. BNP (last 3 results) No results for input(s): PROBNP in the last 8760 hours. HbA1C: No results for input(s): HGBA1C in the last 72 hours. CBG: No results for input(s): GLUCAP in the last 168 hours. Lipid Profile: No results for input(s): CHOL, HDL, LDLCALC, TRIG, CHOLHDL, LDLDIRECT in the last 72 hours. Thyroid Function Tests: No results for input(s): TSH, T4TOTAL, FREET4, T3FREE, THYROIDAB in the last 72 hours. Anemia Panel: No results for input(s): VITAMINB12, FOLATE, FERRITIN, TIBC, IRON, RETICCTPCT in the last 72 hours. Urine analysis:    Component Value  Date/Time   COLORURINE AMBER* 12/05/2015 2100   APPEARANCEUR TURBID* 12/05/2015 2100   LABSPEC 1.014 12/05/2015 2100   PHURINE 6.5 12/05/2015 2100   GLUCOSEU NEGATIVE 12/05/2015 2100   HGBUR NEGATIVE 12/05/2015 2100   BILIRUBINUR MODERATE* 12/05/2015 2100   KETONESUR NEGATIVE 12/05/2015 2100   PROTEINUR NEGATIVE 12/05/2015 2100   NITRITE POSITIVE* 12/05/2015 2100   LEUKOCYTESUR NEGATIVE 12/05/2015 2100   Sepsis Labs: @LABRCNTIP (procalcitonin:4,lacticidven:4) )No results found for this or any previous visit (from the past 240 hour(s)).   Radiological Exams on Admission: Ct Abdomen Pelvis W Contrast  12/05/2015  CLINICAL DATA:  Abdominal distention for 2 weeks. EXAM: CT ABDOMEN AND PELVIS WITH CONTRAST TECHNIQUE: Multidetector CT imaging of the abdomen and pelvis was performed using the standard protocol following bolus administration of intravenous contrast. CONTRAST:  100mL ISOVUE-300 IOPAMIDOL (ISOVUE-300) INJECTION 61% COMPARISON:  None. FINDINGS: Lower chest: Linear atelectasis in the right greater than left lower lobe. No pleural effusion. Liver: Markedly enlarged  and heterogeneous. There is diffusely decreased hepatic density. More confluent subcapsular hypodensity in the anterior right anterior and superior anterior left lobes with mild associated volume loss. More confluent hypodensity in the caudate. Rounded fluid density structures adjacent the gallbladder fossa may be loculated fluid or more confluent hepatic hypodensity. There is blood flow in the portal veins. Hepatobiliary: Sludge in the gallbladder without calcified stone. No biliary dilatation. Pancreas: No ductal dilatation or inflammation. Spleen: Normal in size. Adrenal glands: No nodule. Kidneys: Symmetric renal enhancement.  No hydronephrosis. Stomach/Bowel: Stomach physiologically distended. There is mild small bowel thickening involving left upper quadrant bowel loops. More distal small bowel loops are decompressed. Small  volume of stool throughout the colon without colonic wall thickening. The appendix is normal. Vascular/Lymphatic: No retroperitoneal adenopathy. Abdominal aorta is normal in caliber. Reproductive: Uterus and ovaries are normal for age. Bladder: Physiologically distended without wall thickening. Other: Large volume of intra-abdominal ascites.  No free air. Musculoskeletal: There are no acute or suspicious osseous abnormalities. IMPRESSION: 1. Abnormal appearance of the liver. There is hepatomegaly, heterogeneous parenchyma and diffusely decreased density. More confluent regions of hypodensity in the anterior right and superior anterior left lobes with mild associated volume loss. Overall findings consistent with advanced liver disease, and possible hepatic failure. The more confluent regions suspicious for hepatocellular necrosis. 2. Large volume intra-abdominal ascites. These results were called by telephone at the time of interpretation on 12/05/2015 at 11:34 pm to Dr. Estell Harpin , who verbally acknowledged these results. Electronically Signed   By: Rubye Oaks M.D.   On: 12/05/2015 23:36     Assessment/Plan Principal Problem:   Ascites Active Problems:   Hepatic failure (HCC)   Alcoholic hepatitis   Coagulopathy (HCC)    #1. Ascites probably from portal hypertension secondary to cirrhosis - I have placed patient on empiric antibiotics for SBP coverage. I have ordered ultrasound guided paracentesis and check labs including cell count, Gram stain cultures, albumin and cytology. Patient's MELD score is 14. Closely follow LFTs and INR. Check acute hepatitis panel. Will a GI consult in a.m. #2. Alcoholic hepatitis - discriminant factor is 31. Closely follow LFTs and INR.  #3. Alcohol abuse with withdrawal  - patient has been placed on CIWA protocol with IV Ativan. Low threshold to start Precedex. Closely monitor in stepdown. Advised to quit alcohol.  #4. Mildly elevated lipase - repeat lipase in  a.m. #5. History of ADHD on Adderall. #6. Mild coagulopathy secondary to cirrhosis - follow INR.   DVT Prohylaxis - SCDs. Code Status: Full code.  Family Communication: Husband at the bedside.  Disposition Plan: Home.  Consults called: None.  Admission status: Inpatient. Stepdown. Likely stay 2-3 days.    Eduard Clos MD [facility] Pager [phone].  If 7PM-7AM, please contact night-coverage [email or web address] Password [email or web address]  12/06/2015, 3:08 AM

## 2015-12-06 NOTE — Care Management Note (Signed)
Case Management Note  Patient Details  Name: Angela Villegas MRN: [id number] Date of Birth: 1990/09/05  Subjective/Objective:             sepsis       Action/Plan:Date:  Dec 06, 2015 Chart reviewed for concurrent status and case management needs. Will continue to follow patient for changes and needs: Expected discharge date: [id number] Marcelle SmilingRhonda Davis, BSN, [place]3, [place]   [phone]   Expected Discharge Date:                  Expected Discharge Plan:  Home/Self Care  In-House Referral:  NA  Discharge planning Services  CM Consult  Post Acute Care Choice:  NA Choice offered to:  NA  DME Arranged:    DME Agency:     HH Arranged:    HH Agency:     Status of Service:  In process, will continue to follow  Medicare Important Message Given:    Date Medicare IM Given:    Medicare IM give by:    Date Additional Medicare IM Given:    Additional Medicare Important Message give by:     If discussed at Long Length of Stay Meetings, dates discussed:    Additional Comments:  Golda AcreDavis, Rhonda Lynn, RN 12/06/2015, 9:54 AM

## 2015-12-07 DIAGNOSIS — K7031 Alcoholic cirrhosis of liver with ascites: Principal | ICD-10-CM

## 2015-12-07 LAB — URINE CULTURE

## 2015-12-07 LAB — HEPATIC FUNCTION PANEL
ALT: 8 U/L — ABNORMAL LOW (ref 14–54)
AST: 105 U/L — AB (ref 15–41)
Albumin: 2.8 g/dL — ABNORMAL LOW (ref 3.5–5.0)
Alkaline Phosphatase: 232 U/L — ABNORMAL HIGH (ref 38–126)
BILIRUBIN DIRECT: 2.1 mg/dL — AB (ref 0.1–0.5)
BILIRUBIN INDIRECT: 1.3 mg/dL — AB (ref 0.3–0.9)
Total Bilirubin: 3.4 mg/dL — ABNORMAL HIGH (ref 0.3–1.2)
Total Protein: 6.6 g/dL (ref 6.5–8.1)

## 2015-12-07 LAB — CBC
HCT: 29.8 % — ABNORMAL LOW (ref 36.0–46.0)
Hemoglobin: 9.8 g/dL — ABNORMAL LOW (ref 12.0–15.0)
MCH: 26.8 pg (ref 26.0–34.0)
MCHC: 32.9 g/dL (ref 30.0–36.0)
MCV: 81.6 fL (ref 78.0–100.0)
PLATELETS: 274 10*3/uL (ref 150–400)
RBC: 3.65 MIL/uL — ABNORMAL LOW (ref 3.87–5.11)
RDW: 17.5 % — AB (ref 11.5–15.5)
WBC: 22.9 10*3/uL — ABNORMAL HIGH (ref 4.0–10.5)

## 2015-12-07 LAB — BASIC METABOLIC PANEL
Anion gap: 8 (ref 5–15)
BUN: 6 mg/dL (ref 6–20)
CALCIUM: 7.2 mg/dL — AB (ref 8.9–10.3)
CO2: 27 mmol/L (ref 22–32)
CREATININE: 0.34 mg/dL — AB (ref 0.44–1.00)
Chloride: 97 mmol/L — ABNORMAL LOW (ref 101–111)
GFR calc non Af Amer: >60 mL/min (ref >60)
GLUCOSE: 112 mg/dL — AB (ref 65–99)
Potassium: 3.7 mmol/L (ref 3.5–5.1)
Sodium: 132 mmol/L — ABNORMAL LOW (ref 135–145)

## 2015-12-07 LAB — SEDIMENTATION RATE: SED RATE: 17 mm/h (ref 0–22)

## 2015-12-07 LAB — FERRITIN: FERRITIN: 85 ng/mL (ref 11–307)

## 2015-12-07 LAB — HEPATITIS PANEL, ACUTE
HEP A IGM: NEGATIVE
HEP B C IGM: NEGATIVE
Hepatitis B Surface Ag: NEGATIVE

## 2015-12-07 LAB — PROTIME-INR
INR: 1.69 — AB (ref 0.00–1.49)
PROTHROMBIN TIME: 19.9 s — AB (ref 11.6–15.2)

## 2015-12-07 MED ORDER — SPIRONOLACTONE 100 MG PO TABS
100.0000 mg | ORAL_TABLET | Freq: Every day | ORAL | Status: DC
  Administered 2015-12-07 – 2015-12-13 (×7): 100 mg via ORAL
  Filled 2015-12-07 (×7): qty 1

## 2015-12-07 MED ORDER — ALBUMIN HUMAN 25 % IV SOLN: 25.0000 g | Freq: Once | INTRAVENOUS | Status: DC

## 2015-12-07 MED ORDER — FUROSEMIDE 40 MG PO TABS
40.0000 mg | ORAL_TABLET | Freq: Every day | ORAL | Status: DC
  Administered 2015-12-07 – 2015-12-13 (×7): 40 mg via ORAL
  Filled 2015-12-07 (×7): qty 1

## 2015-12-07 MED ORDER — ALBUMIN HUMAN 25 % IV SOLN
25.0000 g | Freq: Four times a day (QID) | INTRAVENOUS | Status: AC
  Administered 2015-12-07 (×2): 25 g via INTRAVENOUS
  Filled 2015-12-07 (×3): qty 100

## 2015-12-07 NOTE — Progress Notes (Signed)
[facility] PROGRESS NOTE    Progress Note  Angela Villegas  VHQ:[id number] DOB: [birth year] DOA: 12/05/2015 PCP: Frederich Chick, MD     Brief Narrative:   Angela Villegas is an 25 y.o. female past medical history a lot all views that comes in for 2 weeks of increased abdominal pain and distention with right upper quadrant tenderness. She started having nausea and vomiting prior to admission CT scan of the abdomen and pelvis was done that showed possible hepatic necrosis was large volume ascites  Assessment/Plan:   Ascites/leukocytosis: Status post ultrasound guided paracentesis with 4.8 L removed, with a total of 236 white blood cells. We'll continue IV Rocephin as she spiked a temperature, and continues to have leukocytosis. Gram stain of ascites fluid showed no organisms. Her MELD score 14. Blood cultures and ascites fluid cultures are pending.   Hepatic failure (HCC)/ Alcoholic hepatitis/Coagulopathy (HCC) He's had a mild elevation in her INR, bilirubin and LFTs. We'll continue to trend. Her hepatitis  was negative she denies any drug use unlikely a vascular event like ischemic hepatitis. Pregnancy test is negative,Will send for ANA, anti-smooth muscle, Ceruloplasmin,  plasmin and ferritin.  Alcoholic cirrhosis (HCC) Continue to monitor with CIWA. Her last score was 9, her last drink was about 18 hours prior to admission, her alcohol level was 300. Continue thiamine and folate. Acute hepatitis panel negative, UDS positive for BZD and opiates and pregnancy test arenehgative  Hypokalemia: It was repleted orally now resolved.  ADHD: Continue Adderall.  DVT prophylaxis: SCDs Family Communication:none Disposition Plan/Barrier to D/C: home in 3-4 days Code Status:     Code Status Orders        Start     Ordered   12/06/15 0307  Full code   Continuous     12/06/15 0307    Code Status History    Date Active Date Inactive Code Status Order ID Comments User Context   This patient has a current code status but no historical code status.        IV Access:    Peripheral IV   Procedures and diagnostic studies:   Ct Abdomen Pelvis W Contrast  12/05/2015  CLINICAL DATA:  Abdominal distention for 2 weeks. EXAM: CT ABDOMEN AND PELVIS WITH CONTRAST TECHNIQUE: Multidetector CT imaging of the abdomen and pelvis was performed using the standard protocol following bolus administration of intravenous contrast. CONTRAST:  ISOVUE-300 IOPAMIDOL (ISOVUE-300) INJECTION 61% COMPARISON:  None. FINDINGS: Lower chest: Linear atelectasis in the right greater than left lower lobe. No pleural effusion. Liver: Markedly enlarged and heterogeneous. There is diffusely decreased hepatic density. More confluent subcapsular hypodensity in the anterior right anterior and superior anterior left lobes with mild associated volume loss. More confluent hypodensity in the caudate. Rounded fluid density structures adjacent the gallbladder fossa may be loculated fluid or more confluent hepatic hypodensity. There is blood flow in the portal veins. Hepatobiliary: Sludge in the gallbladder without calcified stone. No biliary dilatation. Pancreas: No ductal dilatation or inflammation. Spleen: Normal in size. Adrenal glands: No nodule. Kidneys: Symmetric renal enhancement.  No hydronephrosis. Stomach/Bowel: Stomach physiologically distended. There is mild small bowel thickening involving left upper quadrant bowel loops. More distal small bowel loops are decompressed. Small volume of stool throughout the colon without colonic wall thickening. The appendix is normal. Vascular/Lymphatic: No retroperitoneal adenopathy. Abdominal aorta is normal in caliber. Reproductive: Uterus and ovaries are normal for age. Bladder: Physiologically distended without wall thickening. Other: Large volume of intra-abdominal ascites.  No  free air. Musculoskeletal: There are no acute or suspicious osseous abnormalities.  IMPRESSION: 1. Abnormal appearance of the liver. There is hepatomegaly, heterogeneous parenchyma and diffusely decreased density. More confluent regions of hypodensity in the anterior right and superior anterior left lobes with mild associated volume loss. Overall findings consistent with advanced liver disease, and possible hepatic failure. The more confluent regions suspicious for hepatocellular necrosis. 2. Large volume intra-abdominal ascites. These results were called by telephone at the time of interpretation on 12/05/2015 at 11:34 pm to Dr. Estell Harpin , who verbally acknowledged these results. Electronically Signed   By: Rubye Oaks M.D.   On: 12/05/2015 23:36   US Paracentesis  12/06/2015  INDICATION: Abdominal distension, ascites, hepatic failure / alcoholic hepatitis/probable cirrhosis; request made for diagnostic and therapeutic paracentesis. EXAM: ULTRASOUND GUIDED DIAGNOSTIC AND THERAPEUTIC PARACENTESIS MEDICATIONS: None. COMPLICATIONS: None immediate. PROCEDURE: Informed written consent was obtained from the patient after a discussion of the risks, benefits and alternatives to treatment. A timeout was performed prior to the initiation of the procedure. Initial ultrasound scanning demonstrates a large amount of ascites within the left lower abdominal quadrant. The left lower abdomen was prepped and draped in the usual sterile fashion. 1% lidocaine was used for local anesthesia. Following this, a Yueh catheter was introduced. An ultrasound image was saved for documentation purposes. The paracentesis was performed. The catheter was removed and a dressing was applied. The patient tolerated the procedure well without immediate post procedural complication. FINDINGS: A total of approximately 4.8 liters of golden yellow fluid was removed. Samples were sent to the laboratory as requested by the clinical team. IMPRESSION: Successful ultrasound-guided diagnostic and therapeutic paracentesis yielding 4.8  liters of peritoneal fluid. Read by: Jeananne Rama, PA-C Electronically Signed   By: Simonne Come M.D.   On: 12/06/2015 15:21   Dg Chest Port 1 View  12/06/2015  CLINICAL DATA:  Cough and shortness of breath. Abdominal distension. EXAM: PORTABLE CHEST 1 VIEW COMPARISON:  None. FINDINGS: Lung volumes are low. The cardiomediastinal contours are normal. Pulmonary vasculature is normal. No consolidation, pleural effusion, or pneumothorax. No acute osseous abnormalities are seen. IMPRESSION: Low lung volumes without acute process. Electronically Signed   By: Rubye Oaks M.D.   On: 12/06/2015 04:53     Medical Consultants:    None.  Anti-Infectives:   Rocephin on 5.26.2017  Subjective:    Angela Villegas abdominal pain resolved.  Objective:    Filed Vitals:   12/07/15 0437 12/07/15 0500 12/07/15 0605 12/07/15 0700  BP: 100/69 99/73 101/69 104/73  Pulse: 104 102 110 107  Temp:      TempSrc:      Resp: Height:      Weight:  64.2 kg (141 lb 8.6 oz)    SpO2: 98% 98% 97% 96%    Intake/Output Summary (Last 24 hours) at 12/07/15 0751 Last data filed at 12/07/15 0600  Gross per 24 hour  Intake    420 ml  Output    350 ml  Net     70 ml   Filed Weights   12/06/15 0349 12/06/15 0350 12/07/15 0500  Weight: 69 kg (152 lb 1.9 oz) 69 kg (152 lb 1.9 oz) 64.2 kg (141 lb 8.6 oz)    Exam: General exam: In no acute distress. Respiratory system: Good air movement and clear to auscultation. Cardiovascular system: S1 & S2 heard, RRR. No JVD, murmurs, rubs, gallops or clicks.  Gastrointestinal system: Abdomen less distended, non tender. Central nervous  system: Alert and oriented. No focal neurological deficits. Extremities: No pedal edema. Skin: No rashes, lesions or ulcers Psychiatry: Judgement and insight appear normal. Mood & affect appropriate.    Data Reviewed:    Labs: Basic Metabolic Panel:  Recent Labs Lab 12/05/15 2057 12/06/15 0537 12/07/15 0701  NA  138 137 132*  K 3.3* 3.7 3.7  CL 100* 101 97*  CO2 24 25 27   GLUCOSE 147* 108* 112*  BUN <5* <5* 6  CREATININE 0.39* 0.31* 0.34*  CALCIUM 7.9* 7.6* 7.2*   GFR Estimated Creatinine Clearance: 92.3 mL/min (by C-G formula based on Cr of 0.34). Liver Function Tests:  Recent Labs Lab 12/05/15 2057 12/06/15 0537 12/07/15 0333  AST 162* 137* 105*  ALT 11* 11* 8*  ALKPHOS 300* 256* 232*  BILITOT 2.3* 2.6* 3.4*  PROT 7.9 7.1 6.6  ALBUMIN 3.4* 3.1* 2.8*    Recent Labs Lab 12/05/15 2057  LIPASE 60*   No results for input(s): AMMONIA in the last 168 hours. Coagulation profile  Recent Labs Lab 12/05/15 2057 12/07/15 0333  INR 1.51* 1.69*    CBC:  Recent Labs Lab 12/05/15 2057 12/06/15 0537  WBC 24.8* 20.3*  HGB 10.9* 10.1*  HCT 32.7* 30.5*  MCV 80.5 81.1  PLT 346 337   Cardiac Enzymes: No results for input(s): CKTOTAL, CKMB, CKMBINDEX, TROPONINI in the last 168 hours. BNP (last 3 results) No results for input(s): PROBNP in the last 8760 hours. CBG: No results for input(s): GLUCAP in the last 168 hours. D-Dimer: No results for input(s): DDIMER in the last 72 hours. Hgb A1c: No results for input(s): HGBA1C in the last 72 hours. Lipid Profile: No results for input(s): CHOL, HDL, LDLCALC, TRIG, CHOLHDL, LDLDIRECT in the last 72 hours. Thyroid function studies: No results for input(s): TSH, T4TOTAL, T3FREE, THYROIDAB in the last 72 hours.  Invalid input(s): FREET3 Anemia work up: No results for input(s): VITAMINB12, FOLATE, FERRITIN, TIBC, IRON, RETICCTPCT in the last 72 hours. Sepsis Labs:  Recent Labs Lab 12/05/15 2057 12/06/15 0537  WBC 24.8* 20.3*   Microbiology Recent Results (from the past 240 hour(s))  MRSA PCR Screening     Status: Abnormal   Collection Time: 12/06/15  4:06 AM  Result Value Ref Range Status   MRSA by PCR POSITIVE (A) NEGATIVE Final    Comment:        The GeneXpert MRSA Assay (FDA approved for NASAL specimens only), is one  component of a comprehensive MRSA colonization surveillance program. It is not intended to diagnose MRSA infection nor to guide or monitor treatment for MRSA infections. RESULT CALLED TO, READ BACK BY AND VERIFIED WITHJannet Mantis: Q [facility] CenterMBEMENA RN @ [phone] ON 12/06/15 BY C DAVIS   Gram stain     Status: None   Collection Time: 12/06/15  2:20 PM  Result Value Ref Range Status   Specimen Description FLUID PERITONEAL  Final   Special Requests NONE  Final   Gram Stain FEW MONONUCLEAR CELLS NO ORGANISMS SEEN   Final   Report Status 12/06/2015 FINAL  Final     Medications:   . albumin human  25 g Intravenous Q6H  . amphetamine-dextroamphetamine  15 mg Oral QAC breakfast  . amphetamine-dextroamphetamine  10 mg Oral QPM  . cefTRIAXone (ROCEPHIN)  IV  2 g Intravenous Q24H  . Chlorhexidine Gluconate Cloth  6 each Topical Q0600  . fluticasone  2 spray Each Nare Daily  . folic acid  1 mg Oral Daily  . LORazepam  0-4  mg Intravenous Q6H   Followed by  . [START ON 12/08/2015] LORazepam  0-4 mg Intravenous Q12H  . multivitamin with minerals  1 tablet Oral Daily  . mupirocin ointment  1 application Nasal BID  . pneumococcal 23 valent vaccine  0.5 mL Intramuscular Tomorrow-1000  . propranolol  10 mg Oral BID  . sodium chloride flush  3 mL Intravenous Q12H  . thiamine  100 mg Oral Daily   Continuous Infusions:   Time spent: 25 min   LOS: 1 day   Marinda Elk  [facility] Pager [phone]  *Please refer to [email or web address], password [email or web address] to get updated schedule on who will round on this patient, as hospitalists switch teams weekly. If 7PM-7AM, please contact night-coverage at [email or web address], password [email or web address] for any overnight needs.  12/07/2015, 7:51 AM

## 2015-12-07 NOTE — Consult Note (Signed)
Referring Provider:  Dr. Mable Paris Primary Care Physician:  Frederich Chick, MD Primary Gastroenterologist:  None (unassigned)  Reason for Consultation:  Acute liver disease with ascites  HPI: Angela Villegas is a 25 y.o. female  admitted to the hospital yesterday because of abdominal discomfort and right upper quadrant pain, found to have severe ascites and enzymes compatible with alcohol-induced liver injury, and her acute hepatitis panel is negative. Further serologic tests for alternative sources of liver disease are pending.  The patient and her husband, who is at the bedside, were very helpful in providing a history of alcohol consumption. However, the history seems to change with different examiners.   The patient tells me that she drinks vodka, typically about 1 shot per hour, roughly 1-1/2 ounces each time, beginning around 12 noon each day, and totaling perhaps 15 shots on a daily basis, potentially more on the weekends. Because she spreads out the alcohol consumption during the course of the day, she does not typically become intoxicated.   She has been depressed recently and has not worked for a period of time, so she is drinking more than usual. Her husband also drinks, but is typically not home much of the time because of work and going to school, therefore he is unable to really corroborate the history of alcohol consumption provided by his wife.   The patient indicates she has been drinking heavily for about 2 years, prior to that she drank moderately for a couple of years, and prior to that she drank minimally for several years. She therefore has a total of about 7 years of alcohol intake, only the more recent 2 years quite having, by her report.     Past Medical History  Diagnosis Date  . ADD (attention deficit disorder)   . Asthma   . Hypertension     History reviewed. No pertinent past surgical history.  Prior to Admission medications   Medication Sig Start Date End  Date Taking? Authorizing Provider  amLODipine (NORVASC) 5 MG tablet Take 5 mg by mouth daily.   Yes Historical Provider, MD  amphetamine-dextroamphetamine (ADDERALL XR) 15 MG 24 hr capsule Take 15 mg by mouth every morning.   Yes Historical Provider, MD  amphetamine-dextroamphetamine (ADDERALL) 10 MG tablet Take 10 mg by mouth every evening.  12/21/14  Yes Historical Provider, MD  fluticasone (FLONASE) 50 MCG/ACT nasal spray Place 2 sprays into both nostrils daily.   Yes Historical Provider, MD  guaifenesin (ROBITUSSIN) 100 MG/5ML syrup Take 200 mg by mouth 3 (three) times daily as needed for cough.   Yes Historical Provider, MD  propranolol (INDERAL) 10 MG tablet Take 10 mg by mouth 2 (two) times daily.   Yes Historical Provider, MD  traMADol (ULTRAM) 50 MG tablet Take 50 mg by mouth every 6 (six) hours as needed for moderate pain or severe pain.   Yes Historical Provider, MD  ibuprofen (ADVIL,MOTRIN) 200 MG tablet Take 400 mg by mouth every 6 (six) hours as needed for headache or mild pain.    Historical Provider, MD  oxyCODONE-acetaminophen (PERCOCET) 5-325 MG per tablet Take 2 tablets by mouth every 4 (four) hours as needed. Patient not taking: Reported on 12/05/2015 01/08/15   Gilda Crease, MD  silver sulfADIAZINE (SILVADENE) 1 % cream Apply 1 application topically daily. Patient not taking: Reported on 12/05/2015 01/08/15   Gilda Crease, MD    Current Facility-Administered Medications  Medication Dose Route Frequency Provider Last Rate Last Dose  . albumin  human 25 % solution 25 g  25 g Intravenous Q6H Marinda Elk, MD   25 g at 12/07/15 1610  . amphetamine-dextroamphetamine (ADDERALL XR) 24 hr capsule 15 mg  15 mg Oral QAC breakfast Eduard Clos, MD   15 mg at 12/06/15 0906  . amphetamine-dextroamphetamine (ADDERALL) tablet 10 mg  10 mg Oral QPM Eduard Clos, MD   10 mg at 12/06/15 1800  . cefTRIAXone (ROCEPHIN) 2 g in dextrose 5 % 50 mL IVPB  2 g  Intravenous Q24H Marinda Elk, MD   2 g at 12/06/15 1500  . Chlorhexidine Gluconate Cloth 2 % PADS 6 each  6 each Topical Q0600 Eduard Clos, MD   6 each at 12/07/15 1000  . fluticasone (FLONASE) 50 MCG/ACT nasal spray 2 spray  2 spray Each Nare Daily Eduard Clos, MD   2 spray at 12/06/15 1000  . folic acid (FOLVITE) tablet 1 mg  1 mg Oral Daily Eduard Clos, MD   1 mg at 12/07/15 1014  . LORazepam (ATIVAN) injection 0-4 mg  0-4 mg Intravenous Q6H Eduard Clos, MD   2 mg at 12/07/15 9604   Followed by  . [START ON 12/08/2015] LORazepam (ATIVAN) injection 0-4 mg  0-4 mg Intravenous Q12H Eduard Clos, MD      . LORazepam (ATIVAN) tablet 1 mg  1 mg Oral Q6H PRN Eduard Clos, MD       Or  . LORazepam (ATIVAN) injection 1 mg  1 mg Intravenous Q6H PRN Eduard Clos, MD   1 mg at 12/06/15 0434  . morphine 2 MG/ML injection 2 mg  2 mg Intravenous Q4H PRN Marinda Elk, MD   2 mg at 12/07/15 [phone]  . multivitamin with minerals tablet 1 tablet  1 tablet Oral Daily Eduard Clos, MD   1 tablet at 12/07/15 1014  . mupirocin ointment (BACTROBAN) 2 % 1 application  1 application Nasal BID Eduard Clos, MD   1 application at 12/07/15 0957  . ondansetron (ZOFRAN) tablet 4 mg  4 mg Oral Q6H PRN Eduard Clos, MD       Or  . ondansetron [facility]) injection 4 mg  4 mg Intravenous Q6H PRN Eduard Clos, MD   4 mg at 12/07/15 0604  . propranolol (INDERAL) tablet 10 mg  10 mg Oral BID Marinda Elk, MD   10 mg at 12/07/15 1014  . sodium chloride flush (NS) 0.9 % injection 3 mL  3 mL Intravenous Q12H Eduard Clos, MD   3 mL at 12/07/15 0958  . thiamine (VITAMIN B-1) tablet 100 mg  100 mg Oral Daily Eduard Clos, MD   100 mg at 12/07/15 1013  . vitamin A & D ointment   Topical PRN Eduard Clos, MD        Allergies as of 12/05/2015 - Review Complete 12/05/2015  Allergen Reaction Noted  . Vicodin  [hydrocodone-acetaminophen] Itching and Nausea Only 12/05/2015    Family History  Problem Relation Age of Onset  . GER disease Mother     Social History   Social History  . Marital Status: Single    Spouse Name: N/A  . Number of Children: N/A  . Years of Education: N/A   Occupational History  . Not on file.   Social History Main Topics  . Smoking status: Current Every Day Smoker  . Smokeless tobacco: Not on file  . Alcohol Use:  Yes  . Drug Use: No  . Sexual Activity: Not on file   Other Topics Concern  . Not on file   Social History Narrative    Review of Systems: She admits to consume quite a bit of sodium, especially in the form of Gatorade. She has have right upper quadrant pain and pain in her back.  Physical Exam: Vital signs in last 24 hours: Temp:  [98.2 F (36.8 C)-101.6 F (38.7 C)] 98.6 F (37 C) (05/27 1000) Pulse Rate:  [92-120] 112 (05/27 0900) Resp:  [14-25] 20 (05/27 0900) BP: (96-125)/(61-88) 101/72 mmHg (05/27 0900) SpO2:  [92 %-99 %] 96 % (05/27 0900) Weight:  [64.2 kg (141 lb 8.6 oz)] 64.2 kg (141 lb 8.6 oz) (05/27 0500)   General:   Alert,  Well-developed, well-nourished, pleasant and cooperative in NAD. She does not appear tremulous or in acute withdrawal. Head:  Normocephalic and atraumatic. Eyes:  Sclera clear, no overt icterus, perhaps just a hint of icterus.   Conjunctiva pink. Neck:   No masses or thyromegaly. Lungs:  Clear throughout to auscultation.   No wheezes, crackles, or rhonchi. No evident respiratory distress. Heart:   Regular rate and rhythm; no murmurs, clicks, rubs,  or gallops. Abdomen:  Distended compatible with ascites. No tenderness. The liver is substantially enlarged, with a left lobe that is palpable in the epigastric area and the liver span about 5 fingerbreadths below the right costal margin..   Msk:   Symmetrical without gross deformities. Pulses:  Normal radial pulse is noted. Extremities:   Without  edema. Neurologic:  Alert and coherent;  grossly normal neurologically. Skin:  Intact without significant lesions or rashes. Tattoos present on left arm. Cervical Nodes:  No significant cervical adenopathy. Psych:   Alert and cooperative. Normal mood and affect. Does not come across as being overtly depressed.  Intake/Output from previous day: 05/26 0701 - 05/27 0700 In: 420 [P.O.:360; I.V.:10; IV Piggyback:50] Out: 350 [Urine:350] Intake/Output this shift: Total I/O In: 100 [IV Piggyback:100] Out: -   Lab Results:  Recent Labs  12/05/15 2057 12/06/15 0537 12/07/15 0904  WBC 24.8* 20.3* 22.9*  HGB 10.9* 10.1* 9.8*  HCT 32.7* 30.5* 29.8*  PLT 346 337 274   BMET  Recent Labs  12/05/15 2057 12/06/15 0537 12/07/15 0701  NA 138 137 132*  K 3.3* 3.7 3.7  CL 100* 101 97*  CO2 24 25 27   GLUCOSE 147* 108* 112*  BUN <5* <5* 6  CREATININE 0.39* 0.31* 0.34*  CALCIUM 7.9* 7.6* 7.2*   LFT  Recent Labs  12/07/15 0333  PROT 6.6  ALBUMIN 2.8*  AST 105*  ALT 8*  ALKPHOS 232*  BILITOT 3.4*  BILIDIR 2.1*  IBILI 1.3*   PT/INR  Recent Labs  12/05/15 2057 12/07/15 0333  LABPROT 18.3* 19.9*  INR 1.51* 1.69*    Studies/Results: Ct Abdomen Pelvis W Contrast  12/05/2015  CLINICAL DATA:  Abdominal distention for 2 weeks. EXAM: CT ABDOMEN AND PELVIS WITH CONTRAST TECHNIQUE: Multidetector CT imaging of the abdomen and pelvis was performed using the standard protocol following bolus administration of intravenous contrast. CONTRAST:  ISOVUE-300 IOPAMIDOL (ISOVUE-300) INJECTION 61% COMPARISON:  None. FINDINGS: Lower chest: Linear atelectasis in the right greater than left lower lobe. No pleural effusion. Liver: Markedly enlarged and heterogeneous. There is diffusely decreased hepatic density. More confluent subcapsular hypodensity in the anterior right anterior and superior anterior left lobes with mild associated volume loss. More confluent hypodensity in the caudate.  Rounded fluid  density structures adjacent the gallbladder fossa may be loculated fluid or more confluent hepatic hypodensity. There is blood flow in the portal veins. Hepatobiliary: Sludge in the gallbladder without calcified stone. No biliary dilatation. Pancreas: No ductal dilatation or inflammation. Spleen: Normal in size. Adrenal glands: No nodule. Kidneys: Symmetric renal enhancement.  No hydronephrosis. Stomach/Bowel: Stomach physiologically distended. There is mild small bowel thickening involving left upper quadrant bowel loops. More distal small bowel loops are decompressed. Small volume of stool throughout the colon without colonic wall thickening. The appendix is normal. Vascular/Lymphatic: No retroperitoneal adenopathy. Abdominal aorta is normal in caliber. Reproductive: Uterus and ovaries are normal for age. Bladder: Physiologically distended without wall thickening. Other: Large volume of intra-abdominal ascites.  No free air. Musculoskeletal: There are no acute or suspicious osseous abnormalities. IMPRESSION: 1. Abnormal appearance of the liver. There is hepatomegaly, heterogeneous parenchyma and diffusely decreased density. More confluent regions of hypodensity in the anterior right and superior anterior left lobes with mild associated volume loss. Overall findings consistent with advanced liver disease, and possible hepatic failure. The more confluent regions suspicious for hepatocellular necrosis. 2. Large volume intra-abdominal ascites. These results were called by telephone at the time of interpretation on 12/05/2015 at 11:34 pm to Dr. Estell HarpinZammit , who verbally acknowledged these results. Electronically Signed   By: Rubye OaksMelanie  Ehinger M.D.   On: 12/05/2015 23:36   [place] Paracentesis  12/06/2015  INDICATION: Abdominal distension, ascites, hepatic failure / alcoholic hepatitis/probable cirrhosis; request made for diagnostic and therapeutic paracentesis. EXAM: ULTRASOUND GUIDED DIAGNOSTIC AND THERAPEUTIC  PARACENTESIS MEDICATIONS: None. COMPLICATIONS: None immediate. PROCEDURE: Informed written consent was obtained from the patient after a discussion of the risks, benefits and alternatives to treatment. A timeout was performed prior to the initiation of the procedure. Initial ultrasound scanning demonstrates a large amount of ascites within the left lower abdominal quadrant. The left lower abdomen was prepped and draped in the usual sterile fashion. 1% lidocaine was used for local anesthesia. Following this, a Yueh catheter was introduced. An ultrasound image was saved for documentation purposes. The paracentesis was performed. The catheter was removed and a dressing was applied. The patient tolerated the procedure well without immediate post procedural complication. FINDINGS: A total of approximately 4.8 liters of golden yellow fluid was removed. Samples were sent to the laboratory as requested by the clinical team. IMPRESSION: Successful ultrasound-guided diagnostic and therapeutic paracentesis yielding 4.8 liters of peritoneal fluid. Read by: Jeananne RamaKevin Allred, PA-C Electronically Signed   By: Simonne ComeJohn  Watts M.D.   On: 12/06/2015 15:21   Dg Chest Port 1 View  12/06/2015  CLINICAL DATA:  Cough and shortness of breath. Abdominal distension. EXAM: PORTABLE CHEST 1 VIEW COMPARISON:  None. FINDINGS: Lung volumes are low. The cardiomediastinal contours are normal. Pulmonary vasculature is normal. No consolidation, pleural effusion, or pneumothorax. No acute osseous abnormalities are seen. IMPRESSION: Low lung volumes without acute process. Electronically Signed   By: Rubye OaksMelanie  Ehinger M.D.   On: 12/06/2015 04:53    Impression: 1. Elevated liver chemistries with OT/PT split, right upper quadrant pain, hepatomegaly, and ascites--all of which is compatible with alcohol-induced liver disease. Her history of alcohol intake seems to be sufficient to have led to alcoholic hepatitis, especially in a female patient. 2. With  normal platelet count and no radiographic evidence of cirrhosis on CT, it is my assumption that this is primarily acute alcoholic hepatitis rather than chronic liver disease. The questionable "necrosis" on CT is probably heterogeneity related to localize fatty infiltration.  Plan: 1.  Await results of blood tests for alternative sources of liver disease 2. I spent approximately 40 minutes with the patient and her husband instructing them about alcohol related liver disease, including causation, risk factors, prognosis, and management. 3. By far, the largest determinant of this patient's prognosis will be her future avoidance of alcohol. I went over different ways of accomplishing that goal. 4. Agree with sodium restricted diet, avoidance of IV fluids for management of fluid retention. We will start diuretic therapy at this time, since her renal function is normal. 5. Maddrey discriminant function is 40, which is somewhat elevated (greater than 32 is of concern), and meld score is 21, which is mildly elevated. For now, I would hold off on steroids, which are of questionable value but may be helpful in patients with the  most severe alcoholic hepatitis.(Note that the patient is not overtly encephalopathic).    LOS: 1 day   Jakeob Tullis V  12/07/2015, 11:08 AM   Pager [phone] If no answer or after 5 PM call [phone]

## 2015-12-07 NOTE — Progress Notes (Signed)
Temp this morning 101.6. MD notified via text page.    12/07/15 0800  Vitals  Temp (!) 101.6 F (38.7 C)  Temp Source Oral  BP 108/78 mmHg  MAP (mmHg) 87  Pulse Rate (!) 111  ECG Heart Rate (!) 112  Resp (!) 22  Oxygen Therapy  SpO2 95 %  O2 Device Nasal Cannula  O2 Flow Rate (L/min) 2 L/min  Pain Assessment  Pain Assessment No/denies pain  Glasgow Coma Scale  Eye Opening 4  Best Verbal Response (NON-intubated) 5  Best Motor Response 6  Glasgow Coma Scale Score 15

## 2015-12-07 NOTE — Progress Notes (Signed)
Paged and notified on call provider with [facility] of patients albumin level this am.

## 2015-12-07 NOTE — Progress Notes (Signed)
Foley catheter inserted at 11:50 AM. Pt needs a 24 hour urine copper. Receiving RN on 3 [facility] is made aware.

## 2015-12-07 NOTE — Progress Notes (Signed)
Pt transferred via WC, non-tele, RA (92% O2 Sat) to [id number]. Pt remains stable. Pt's husband at bedside.

## 2015-12-08 LAB — HEPATIC FUNCTION PANEL
ALT: 9 U/L — AB (ref 14–54)
AST: 98 U/L — AB (ref 15–41)
Albumin: 3 g/dL — ABNORMAL LOW (ref 3.5–5.0)
Alkaline Phosphatase: 213 U/L — ABNORMAL HIGH (ref 38–126)
Bilirubin, Direct: 2 mg/dL — ABNORMAL HIGH (ref 0.1–0.5)
Indirect Bilirubin: 1.5 mg/dL — ABNORMAL HIGH (ref 0.3–0.9)
TOTAL PROTEIN: 6.5 g/dL (ref 6.5–8.1)
Total Bilirubin: 3.5 mg/dL — ABNORMAL HIGH (ref 0.3–1.2)

## 2015-12-08 LAB — PROTIME-INR
INR: 1.62 — AB (ref 0.00–1.49)
Prothrombin Time: 19.3 seconds — ABNORMAL HIGH (ref 11.6–15.2)

## 2015-12-08 LAB — BASIC METABOLIC PANEL
ANION GAP: 8 (ref 5–15)
BUN: 5 mg/dL — ABNORMAL LOW (ref 6–20)
CO2: 27 mmol/L (ref 22–32)
Calcium: 7.4 mg/dL — ABNORMAL LOW (ref 8.9–10.3)
Chloride: 95 mmol/L — ABNORMAL LOW (ref 101–111)
Creatinine, Ser: 0.5 mg/dL (ref 0.44–1.00)
GLUCOSE: 127 mg/dL — AB (ref 65–99)
POTASSIUM: 3.3 mmol/L — AB (ref 3.5–5.1)
Sodium: 130 mmol/L — ABNORMAL LOW (ref 135–145)

## 2015-12-08 LAB — ALPHA-1-ANTITRYPSIN: A1 ANTITRYPSIN SER: 220 mg/dL — AB (ref 90–200)

## 2015-12-08 MED ORDER — MORPHINE SULFATE 15 MG PO TABS
15.0000 mg | ORAL_TABLET | ORAL | Status: DC | PRN
  Administered 2015-12-08 – 2015-12-13 (×18): 15 mg via ORAL
  Filled 2015-12-08 (×19): qty 1

## 2015-12-08 MED ORDER — POTASSIUM CHLORIDE CRYS ER 20 MEQ PO TBCR
40.0000 meq | EXTENDED_RELEASE_TABLET | Freq: Two times a day (BID) | ORAL | Status: AC
  Administered 2015-12-08 (×2): 40 meq via ORAL
  Filled 2015-12-08 (×2): qty 2

## 2015-12-08 MED ORDER — POTASSIUM CHLORIDE 20 MEQ/15ML (10%) PO SOLN
40.0000 meq | Freq: Once | ORAL | Status: AC
  Administered 2015-12-08: 40 meq via ORAL
  Filled 2015-12-08: qty 30

## 2015-12-08 NOTE — Progress Notes (Signed)
GASTROENTEROLOGY PROGRESS NOTE  Problem:   Acute alcoholic hepatitis  Subjective: Feels about the same. Abdomen seems to be a bit less comfortable today, more swollen.  Objective: No acute distress. Significant residual ascites. Weight from today not yet available.  LFTs show slight improvement, except for slight rise in bilirubin which is expected, initially, following alcohol cessation.  Potassium is slightly low at 3.3.  Patient is coherent, does serial threes promptly, no asterixis.  Assessment: 1. Stable alcoholic hepatitis 2. Main clinical complication is ascites. Patient is on diuretic therapy (started yesterday) and low sodium diet.  Plan: 1. I would continue to hold off on steroids, given absence of encephalopathy and declining transaminases. 2. I do not think that repeat paracentesis is needed at this time; we will monitor the patient's weights and renal function as the spironolactone has a chance to kick in over the next several days. 3. Will order 1 time dose of potassium chloride supplementation.  Florencia Reasonsobert V. Shivaay Stormont, M.D. 12/08/2015 10:02 AM  Pager [phone] If no answer or after 5 PM call [phone]

## 2015-12-08 NOTE — Progress Notes (Signed)
[facility] PROGRESS NOTE    Progress Note  Angela Villegas  EYC:[id number] DOB: 05-15-1991 DOA: 12/05/2015 PCP: Jonathon Bellows, MD     Brief Narrative:   Angela Villegas is an 25 y.o. female past medical history a lot all views that comes in for 2 weeks of increased abdominal pain and distention with right upper quadrant tenderness. She started having nausea and vomiting prior to admission CT scan of the abdomen and pelvis was done that showed possible hepatic necrosis was large volume ascites  Assessment/Plan:   Ascites/leukocytosis: Status post ultrasound guided paracentesis with 4.8 L removed, with a total of 236 white blood cells. We'll continue IV Rocephin as she spiked a temperature, her fever have now resolved. Blood cultures and ascites fluid cultures are negative, probably her leukocytosis is due to inflammatory liver reaction. She was started empirically on spironolactone and Lasix. Continue fluid restriction.  Alcoholic hepatitis/Coagulopathy ([place]) He's had a mild elevation in her INR, bilirubin, her LFTs are trending down.   Her MELD score 14, her MAddery function is 40 which is quite high. Ferritin is 85 alpha-1 antitrypsin is pending, ESR 17 Hepatitis  Panel was negative. ANA, anti-smooth muscle, Ceruloplasmin  plasmin and 24 copper are pending. We have consulted GI and appreciate assistance  Alcoholic abuse ([place]) Continue to monitor with CIWA. Her score was 10 overnight but has improved. Continue thiamine and folate.  Hypokalemia: Started spironolactone, recheck in the morning.  ADHD: Continue Adderall.  DVT prophylaxis: SCDs Family Communication:none Disposition Plan/Barrier to D/C: home in 4 days Code Status:     Code Status Orders        Start     Ordered   12/06/15 0307  Full code   Continuous     12/06/15 0307    Code Status History    Date Active Date Inactive Code Status Order ID Comments User Context   This patient has a current code  status but no historical code status.        IV Access:    Peripheral IV   Procedures and diagnostic studies:   US Paracentesis  12/06/2015  INDICATION: Abdominal distension, ascites, hepatic failure / alcoholic hepatitis/probable cirrhosis; request made for diagnostic and therapeutic paracentesis. EXAM: ULTRASOUND GUIDED DIAGNOSTIC AND THERAPEUTIC PARACENTESIS MEDICATIONS: None. COMPLICATIONS: None immediate. PROCEDURE: Informed written consent was obtained from the patient after a discussion of the risks, benefits and alternatives to treatment. A timeout was performed prior to the initiation of the procedure. Initial ultrasound scanning demonstrates a large amount of ascites within the left lower abdominal quadrant. The left lower abdomen was prepped and draped in the usual sterile fashion. 1% lidocaine was used for local anesthesia. Following this, a Yueh catheter was introduced. An ultrasound image was saved for documentation purposes. The paracentesis was performed. The catheter was removed and a dressing was applied. The patient tolerated the procedure well without immediate post procedural complication. FINDINGS: A total of approximately 4.8 liters of golden yellow fluid was removed. Samples were sent to the laboratory as requested by the clinical team. IMPRESSION: Successful ultrasound-guided diagnostic and therapeutic paracentesis yielding 4.8 liters of peritoneal fluid. Read by: Rowe Robert, PA-C Electronically Signed   By: Sandi Mariscal M.D.   On: 12/06/2015 15:21     Medical Consultants:    None.  Anti-Infectives:   Rocephin on 5.26.2017  Subjective:    Angela Villegas abdominal pain resolved.  Objective:    Filed Vitals:   12/07/15 1200 12/07/15 1329 12/07/15 2107 12/08/15 1497  BP: 95/66 98/68 102/74 102/75  Pulse: 92 98 107 104  Temp: 98.4 F (36.9 C) 98.4 F (36.9 C) 99.4 F (37.4 C) 99.7 F (37.6 C)  TempSrc: Oral Oral Oral Oral  Resp: _0 Height:  _1  (1.549 m)     Weight: 64.411 kg (142 lb)     SpO2: 95% 96% 94% 93%    Intake/Output Summary (Last 24 hours) at 12/08/15 0756 Last data filed at 12/07/15 2110  Gross per 24 hour  Intake    100 ml  Output   1050 ml  Net   -950 ml   Filed Weights   12/06/15 0350 12/07/15 0500 12/07/15 1200  Weight: 69 kg (152 lb 1.9 oz) 64.2 kg (141 lb 8.6 oz) 64.411 kg (142 lb)    Exam: General exam: In no acute distress. Respiratory system: Good air movement and clear to auscultation. Cardiovascular system: S1 & S2 heard, RRR. No JVD, murmurs, rubs, gallops or clicks.  Gastrointestinal system: Abdomen Seems to be more distended today. Central nervous system: Alert and oriented. No focal neurological deficits. Extremities: No pedal edema. Skin: No rashes, lesions or ulcers Psychiatry: Judgement and insight appear normal. Mood & affect appropriate.    Data Reviewed:    Labs: Basic Metabolic Panel:  Recent Labs Lab 12/05/15 2057 12/06/15 0537 12/07/15 0701 12/08/15 0448  NA 138 137 132* 130*  K 3.3* 3.7 3.7 3.3*  CL 100* 101 97* 95*  CO2 _2 GLUCOSE 147* 108* 112* 127*  BUN <5* <5* 6 5*  CREATININE 0.39* 0.31* 0.34* 0.50  CALCIUM 7.9* 7.6* 7.2* 7.4*   GFR Estimated Creatinine Clearance: 92.3 mL/min (by C-G formula based on Cr of 0.5). Liver Function Tests:  Recent Labs Lab 12/05/15 2057 12/06/15 0537 12/07/15 0333 12/08/15 0448  AST 162* 137* 105* 98*  ALT 11* 11* 8* 9*  ALKPHOS 300* 256* 232* 213*  BILITOT 2.3* 2.6* 3.4* 3.5*  PROT 7.9 7.1 6.6 6.5  ALBUMIN 3.4* 3.1* 2.8* 3.0*    Recent Labs Lab 12/05/15 2057  LIPASE 60*   No results for input(s): AMMONIA in the last 168 hours. Coagulation profile  Recent Labs Lab 12/05/15 2057 12/07/15 0333 12/08/15 0448  INR 1.51* 1.69* 1.62*    CBC:  Recent Labs Lab 12/05/15 2057 12/06/15 0537 12/07/15 0904  WBC 24.8* 20.3* 22.9*  HGB 10.9* 10.1* 9.8*  HCT 32.7* 30.5* 29.8*  MCV 80.5 81.1 81.6    PLT 346 337 274   Cardiac Enzymes: No results for input(s): CKTOTAL, CKMB, CKMBINDEX, TROPONINI in the last 168 hours. BNP (last 3 results) No results for input(s): PROBNP in the last 8760 hours. CBG: No results for input(s): GLUCAP in the last 168 hours. D-Dimer: No results for input(s): DDIMER in the last 72 hours. Hgb A1c: No results for input(s): HGBA1C in the last 72 hours. Lipid Profile: No results for input(s): CHOL, HDL, LDLCALC, TRIG, CHOLHDL, LDLDIRECT in the last 72 hours. Thyroid function studies: No results for input(s): TSH, T4TOTAL, T3FREE, THYROIDAB in the last 72 hours.  Invalid input(s): FREET3 Anemia work up:  Recent Labs  12/07/15 0904  FERRITIN 85   Sepsis Labs:  Recent Labs Lab 12/05/15 2057 12/06/15 0537 12/07/15 0904  WBC 24.8* 20.3* 22.9*   Microbiology Recent Results (from the past 240 hour(s))  Urine culture     Status: Abnormal   Collection Time: 12/05/15  9:00 PM  Result Value Ref Range Status   Specimen Description  URINE, CLEAN CATCH  Final   Special Requests NONE  Final   Culture MULTIPLE SPECIES PRESENT, SUGGEST RECOLLECTION (A)  Final   Report Status 12/07/2015 FINAL  Final  Culture, blood (routine x 2)     Status: None (Preliminary result)   Collection Time: 12/06/15 12:30 AM  Result Value Ref Range Status   Specimen Description BLOOD LEFT ANTECUBITAL  Final   Special Requests BOTTLES DRAWN AEROBIC AND ANAEROBIC 5ML  Final   Culture   Final    NO GROWTH 1 DAY Performed at [facility]    Report Status PENDING  Incomplete  MRSA PCR Screening     Status: Abnormal   Collection Time: 12/06/15  4:06 AM  Result Value Ref Range Status   MRSA by PCR POSITIVE (A) NEGATIVE Final    Comment:        The GeneXpert MRSA Assay (FDA approved for NASAL specimens only), is one component of a comprehensive MRSA colonization surveillance program. It is not intended to diagnose MRSA infection nor to guide or monitor treatment  for MRSA infections. RESULT CALLED TO, READ BACK BY AND VERIFIED WITH: Q MBEMENA RN @ [phone] ON 12/06/15 BY C DAVIS   Culture, blood (routine x 2)     Status: None (Preliminary result)   Collection Time: 12/06/15  6:22 AM  Result Value Ref Range Status   Specimen Description BLOOD LEFT ARM  Final   Special Requests BOTTLES DRAWN AEROBIC ONLY 5CC  Final   Culture   Final    NO GROWTH 1 DAY Performed at [facility]    Report Status PENDING  Incomplete  Culture, body fluid-bottle     Status: None (Preliminary result)   Collection Time: 12/06/15  2:20 PM  Result Value Ref Range Status   Specimen Description FLUID PERITONEAL  Final   Special Requests BOTTLES DRAWN AEROBIC AND ANAEROBIC 10CC  Final   Culture   Final    NO GROWTH < 24 HOURS Performed at [facility]    Report Status PENDING  Incomplete  Gram stain     Status: None   Collection Time: 12/06/15  2:20 PM  Result Value Ref Range Status   Specimen Description FLUID PERITONEAL  Final   Special Requests NONE  Final   Gram Stain FEW MONONUCLEAR CELLS NO ORGANISMS SEEN   Final   Report Status 12/06/2015 FINAL  Final     Medications:   . amphetamine-dextroamphetamine  15 mg Oral QAC breakfast  . amphetamine-dextroamphetamine  10 mg Oral QPM  . cefTRIAXone (ROCEPHIN)  IV  2 g Intravenous Q24H  . Chlorhexidine Gluconate Cloth  6 each Topical Q0600  . fluticasone  2 spray Each Nare Daily  . folic acid  1 mg Oral Daily  . furosemide  40 mg Oral Daily  . LORazepam  0-4 mg Intravenous Q12H  . multivitamin with minerals  1 tablet Oral Daily  . mupirocin ointment  1 application Nasal BID  . propranolol  10 mg Oral BID  . sodium chloride flush  3 mL Intravenous Q12H  . spironolactone  100 mg Oral Daily  . thiamine  100 mg Oral Daily   Continuous Infusions:   Time spent: 15 min   LOS: 2 days   Charlynne Cousins  [facility] Pager [phone]  *Please refer to [email or web address], password [email or web address] to get  updated schedule on who will round on this patient, as hospitalists switch teams weekly. If 7PM-7AM, please contact night-coverage at [email or web address],  password [email or web address] for any overnight needs.  12/08/2015, 7:56 AM

## 2015-12-09 DIAGNOSIS — K7011 Alcoholic hepatitis with ascites: Secondary | ICD-10-CM

## 2015-12-09 LAB — HEPATIC FUNCTION PANEL
ALBUMIN: 3.4 g/dL — AB (ref 3.5–5.0)
ALK PHOS: 204 U/L — AB (ref 38–126)
ALT: 13 U/L — AB (ref 14–54)
AST: 133 U/L — ABNORMAL HIGH (ref 15–41)
Bilirubin, Direct: 2.1 mg/dL — ABNORMAL HIGH (ref 0.1–0.5)
Indirect Bilirubin: 1.7 mg/dL — ABNORMAL HIGH (ref 0.3–0.9)
TOTAL PROTEIN: 7.2 g/dL (ref 6.5–8.1)
Total Bilirubin: 3.8 mg/dL — ABNORMAL HIGH (ref 0.3–1.2)

## 2015-12-09 LAB — BASIC METABOLIC PANEL
Anion gap: 7 (ref 5–15)
BUN: 5 mg/dL — AB (ref 6–20)
CHLORIDE: 98 mmol/L — AB (ref 101–111)
CO2: 27 mmol/L (ref 22–32)
Calcium: 7.9 mg/dL — ABNORMAL LOW (ref 8.9–10.3)
Creatinine, Ser: 0.43 mg/dL — ABNORMAL LOW (ref 0.44–1.00)
GFR calc Af Amer: >60 mL/min (ref >60)
GFR calc non Af Amer: >60 mL/min (ref >60)
GLUCOSE: 115 mg/dL — AB (ref 65–99)
POTASSIUM: 4.8 mmol/L (ref 3.5–5.1)
SODIUM: 132 mmol/L — AB (ref 135–145)

## 2015-12-09 LAB — CBC
HEMATOCRIT: 32.6 % — AB (ref 36.0–46.0)
HEMOGLOBIN: 10.5 g/dL — AB (ref 12.0–15.0)
MCH: 26.9 pg (ref 26.0–34.0)
MCHC: 32.2 g/dL (ref 30.0–36.0)
MCV: 83.6 fL (ref 78.0–100.0)
Platelets: 286 10*3/uL (ref 150–400)
RBC: 3.9 MIL/uL (ref 3.87–5.11)
RDW: 18.7 % — AB (ref 11.5–15.5)
WBC: 22.8 10*3/uL — AB (ref 4.0–10.5)

## 2015-12-09 LAB — PROTIME-INR
INR: 1.6 — AB (ref 0.00–1.49)
Prothrombin Time: 19.1 seconds — ABNORMAL HIGH (ref 11.6–15.2)

## 2015-12-09 LAB — CERULOPLASMIN: Ceruloplasmin: 34.1 mg/dL (ref 19.0–39.0)

## 2015-12-09 NOTE — Progress Notes (Signed)
[facility] PROGRESS NOTE    Progress Note  Angela Villegas  ZOX:[id number] DOB: 29-Mar-1991 DOA: 12/05/2015 PCP: Frederich Chick, MD     Brief Narrative:   Angela Villegas is an 25 y.o. female past medical history a lot all views that comes in for 2 weeks of increased abdominal pain and distention with right upper quadrant tenderness. She started having nausea and vomiting prior to admission CT scan of the abdomen and pelvis was done that showed possible hepatic necrosis was large volume ascites  Assessment/Plan:   Ascites/leukocytosis: Status post ultrasound guided paracentesis with 4.8 L removed, with a total of 236 white blood cells. Has remained afebrile we'll discontinue IV Rocephin. Blood cultures and ascites fluid cultures are negative, probably her leukocytosis is due to inflammatory liver reaction. Continue fluid restriction and Aldactone and  Lasix.  Alcoholic hepatitis/Coagulopathy ([facility]) He's had a mild elevation in her INR, bilirubin, her LFTs are trending down.  Her MELD score 14, her MAddery function is 40 which is quite high. ANA, anti-smooth muscle, Ceruloplasmin  plasmin and 24 copper are pending. Appreciate GI assistance.  Alcoholic abuse ([facility]) Continue to monitor with CIWA. Her score was 10 overnight but has improved. Continue thiamine and folate.  Hypokalemia: Continue spironolactone, resolved.  ADHD: Continue Adderall.  DVT prophylaxis: SCDs Family Communication:none Disposition Plan/Barrier to D/C: home in 2 days Code Status:     Code Status Orders        Start     Ordered   12/06/15 0307  Full code   Continuous     12/06/15 0307    Code Status History    Date Active Date Inactive Code Status Order ID Comments User Context   This patient has a current code status but no historical code status.        IV Access:    Peripheral IV   Procedures and diagnostic studies:   No results found.   Medical Consultants:     None.  Anti-Infectives:   Rocephin on 5.26.2017  Subjective:    Angela Villegas abdominal pain resolved.Tolerating diet.  Objective:    Filed Vitals:   12/08/15 0524 12/08/15 1328 12/08/15 2044 12/09/15 0535  BP: 102/75 93/62 99/79  95/69  Pulse: 104 94 102 95  Temp: 99.7 F (37.6 C) 97.5 F (36.4 C) 97.8 F (36.6 C) 98.9 F (37.2 C)  TempSrc: Oral Oral Oral Oral  Resp: 16 16 16 16   Height:      Weight:      SpO2: 93% 94% 100% 98%    Intake/Output Summary (Last 24 hours) at 12/09/15 0818 Last data filed at 12/08/15 1700  Gross per 24 hour  Intake    480 ml  Output      0 ml  Net    480 ml   Filed Weights   12/06/15 0350 12/07/15 0500 12/07/15 1200  Weight: 69 kg (152 lb 1.9 oz) 64.2 kg (141 lb 8.6 oz) 64.411 kg (142 lb)    Exam: General exam: In no acute distress. Respiratory system: Good air movement and clear to auscultation. Cardiovascular system: S1 & S2 heard, RRR. No JVD, murmurs, rubs, gallops or clicks.  Gastrointestinal system: Abdomen Seems to be more distended today. Central nervous system: Alert and oriented. No focal neurological deficits. Extremities: No pedal edema. Skin: No rashes, lesions or ulcers Psychiatry: Judgement and insight appear normal. Mood & affect appropriate.    Data Reviewed:    Labs: Basic Metabolic Panel:  Recent Labs Lab 12/05/15 2057  12/06/15 0537 12/07/15 0701 12/08/15 0448 12/09/15 0322  NA 138 137 132* 130* 132*  K 3.3* 3.7 3.7 3.3* 4.8  CL 100* 101 97* 95* 98*  CO2 24 25 27 27 27   GLUCOSE 147* 108* 112* 127* 115*  BUN <5* <5* 6 5* 5*  CREATININE 0.39* 0.31* 0.34* 0.50 0.43*  CALCIUM 7.9* 7.6* 7.2* 7.4* 7.9*   GFR Estimated Creatinine Clearance: 92.3 mL/min (by C-G formula based on Cr of 0.43). Liver Function Tests:  Recent Labs Lab 12/05/15 2057 12/06/15 0537 12/07/15 0333 12/08/15 0448 12/09/15 0322  AST 162* 137* 105* 98* 133*  ALT 11* 11* 8* 9* 13*  ALKPHOS 300* 256* 232* 213* 204*   BILITOT 2.3* 2.6* 3.4* 3.5* 3.8*  PROT 7.9 7.1 6.6 6.5 7.2  ALBUMIN 3.4* 3.1* 2.8* 3.0* 3.4*    Recent Labs Lab 12/05/15 2057  LIPASE 60*   No results for input(s): AMMONIA in the last 168 hours. Coagulation profile  Recent Labs Lab 12/05/15 2057 12/07/15 0333 12/08/15 0448 12/09/15 0322  INR 1.51* 1.69* 1.62* 1.60*    CBC:  Recent Labs Lab 12/05/15 2057 12/06/15 0537 12/07/15 0904 12/09/15 0322  WBC 24.8* 20.3* 22.9* 22.8*  HGB 10.9* 10.1* 9.8* 10.5*  HCT 32.7* 30.5* 29.8* 32.6*  MCV 80.5 81.1 81.6 83.6  PLT 346 337 274 286   Cardiac Enzymes: No results for input(s): CKTOTAL, CKMB, CKMBINDEX, TROPONINI in the last 168 hours. BNP (last 3 results) No results for input(s): PROBNP in the last 8760 hours. CBG: No results for input(s): GLUCAP in the last 168 hours. D-Dimer: No results for input(s): DDIMER in the last 72 hours. Hgb A1c: No results for input(s): HGBA1C in the last 72 hours. Lipid Profile: No results for input(s): CHOL, HDL, LDLCALC, TRIG, CHOLHDL, LDLDIRECT in the last 72 hours. Thyroid function studies: No results for input(s): TSH, T4TOTAL, T3FREE, THYROIDAB in the last 72 hours.  Invalid input(s): FREET3 Anemia work up:  Recent Labs  12/07/15 0904  FERRITIN 85   Sepsis Labs:  Recent Labs Lab 12/05/15 2057 12/06/15 0537 12/07/15 0904 12/09/15 0322  WBC 24.8* 20.3* 22.9* 22.8*   Microbiology Recent Results (from the past 240 hour(s))  Urine culture     Status: Abnormal   Collection Time: 12/05/15  9:00 PM  Result Value Ref Range Status   Specimen Description URINE, CLEAN CATCH  Final   Special Requests NONE  Final   Culture MULTIPLE SPECIES PRESENT, SUGGEST RECOLLECTION (A)  Final   Report Status 12/07/2015 FINAL  Final  Culture, blood (routine x 2)     Status: None (Preliminary result)   Collection Time: 12/06/15 12:30 AM  Result Value Ref Range Status   Specimen Description BLOOD LEFT ANTECUBITAL  Final   Special  Requests BOTTLES DRAWN AEROBIC AND ANAEROBIC 5ML  Final   Culture   Final    NO GROWTH 2 DAYS [facility]    Report Status PENDING  Incomplete  MRSA PCR Screening     Status: Abnormal   Collection Time: 12/06/15  4:06 AM  Result Value Ref Range Status   MRSA by PCR POSITIVE (A) NEGATIVE Final    Comment:        The GeneXpert MRSA Assay (FDA approved for NASAL specimens only), is one component of a comprehensive MRSA colonization surveillance program. It is not intended to diagnose MRSA infection nor to guide or monitor treatment for MRSA infections. RESULT CALLED TO, READ BACK BY AND VERIFIED WITH: Q MBEMENA  RN @ [phone] ON 12/06/15 BY C DAVIS   Culture, blood (routine x 2)     Status: None (Preliminary result)   Collection Time: 12/06/15  6:22 AM  Result Value Ref Range Status   Specimen Description BLOOD LEFT ARM  Final   Special Requests BOTTLES DRAWN AEROBIC ONLY 5CC  Final   Culture   Final    NO GROWTH 2 DAYS Performed at [facility]    Report Status PENDING  Incomplete  Culture, body fluid-bottle     Status: None (Preliminary result)   Collection Time: 12/06/15  2:20 PM  Result Value Ref Range Status   Specimen Description FLUID PERITONEAL  Final   Special Requests BOTTLES DRAWN AEROBIC AND ANAEROBIC 10CC  Final   Culture   Final    NO GROWTH 2 DAYS Performed at [facility]    Report Status PENDING  Incomplete  Gram stain     Status: None   Collection Time: 12/06/15  2:20 PM  Result Value Ref Range Status   Specimen Description FLUID PERITONEAL  Final   Special Requests NONE  Final   Gram Stain FEW MONONUCLEAR CELLS NO ORGANISMS SEEN   Final   Report Status 12/06/2015 FINAL  Final     Medications:   . amphetamine-dextroamphetamine  15 mg Oral QAC breakfast  . amphetamine-dextroamphetamine  10 mg Oral QPM  . cefTRIAXone (ROCEPHIN)  IV  2 g Intravenous Q24H  . Chlorhexidine Gluconate Cloth  6 each Topical Q0600  .  fluticasone  2 spray Each Nare Daily  . folic acid  1 mg Oral Daily  . furosemide  40 mg Oral Daily  . LORazepam  0-4 mg Intravenous Q12H  . multivitamin with minerals  1 tablet Oral Daily  . mupirocin ointment  1 application Nasal BID  . propranolol  10 mg Oral BID  . sodium chloride flush  3 mL Intravenous Q12H  . spironolactone  100 mg Oral Daily  . thiamine  100 mg Oral Daily   Continuous Infusions:   Time spent: 15 min   LOS: 3 days   Marinda Elk  [facility] Pager [phone]  *Please refer to [email or web address], password [email or web address] to get updated schedule on who will round on this patient, as hospitalists switch teams weekly. If 7PM-7AM, please contact night-coverage at [email or web address], password [email or web address] for any overnight needs.  12/09/2015, 8:18 AM

## 2015-12-09 NOTE — Progress Notes (Signed)
GASTROENTEROLOGY PROGRESS NOTE  Problem:   (Presumed) alcoholic hepatitis with ascites  Subjective: Vomited after pills today, but is feeling better now.  Objective: No recent daily weights recorded (ordered today).  Potassium is now corrected, at 4.8.   Renal function remains normal on diuretic therapy (Lasix 40 mg, Aldactone 100 mg every morning).  LFTs are all a little bit worse today, although that is probably not significant.  Assessment: Alcoholic hepatitis without encephalopathy.  Moderate ascites, status post large-volume paracentesis 2.  Plan: 1. Monitor weight on diuretic therapy 2. Monitor renal function on diuretic therapy 3. Monitor liver chemistries, consider institution of steroids especially if they show progressive worsening 4. Patient desirous of seeing social worker to discuss options for alcohol addiction treatment. 5. The patient will need to have close follow-up following discharge to monitor weight and renal function in response to diuretic therapy, as well as to monitor for continued alcohol abstinence and resolution of acute alcoholic hepatitis. Realistically, she will need to get established with a primary physician for this purpose, because I will not be able to see her in the office with the frequency that is required for her management. However, I would be willing to see her once or twice in the near future until she can become established with a primary physician.  Angela Villegas, M.D. 12/09/2015 10:56 AM  Pager [phone] If no answer or after 5 PM call [phone]

## 2015-12-10 DIAGNOSIS — R7989 Other specified abnormal findings of blood chemistry: Secondary | ICD-10-CM

## 2015-12-10 LAB — PROTIME-INR
INR: 1.65 — ABNORMAL HIGH (ref 0.00–1.49)
Prothrombin Time: 19.5 seconds — ABNORMAL HIGH (ref 11.6–15.2)

## 2015-12-10 LAB — HEPATIC FUNCTION PANEL
ALBUMIN: 3.1 g/dL — AB (ref 3.5–5.0)
ALK PHOS: 184 U/L — AB (ref 38–126)
ALT: 14 U/L (ref 14–54)
AST: 153 U/L — ABNORMAL HIGH (ref 15–41)
BILIRUBIN DIRECT: 2.3 mg/dL — AB (ref 0.1–0.5)
BILIRUBIN TOTAL: 3.9 mg/dL — AB (ref 0.3–1.2)
Indirect Bilirubin: 1.6 mg/dL — ABNORMAL HIGH (ref 0.3–0.9)
Total Protein: 7.1 g/dL (ref 6.5–8.1)

## 2015-12-10 LAB — BASIC METABOLIC PANEL
Anion gap: 7 (ref 5–15)
CALCIUM: 8.1 mg/dL — AB (ref 8.9–10.3)
CHLORIDE: 95 mmol/L — AB (ref 101–111)
CO2: 26 mmol/L (ref 22–32)
CREATININE: 0.51 mg/dL (ref 0.44–1.00)
GFR calc Af Amer: >60 mL/min (ref >60)
GFR calc non Af Amer: >60 mL/min (ref >60)
Glucose, Bld: 104 mg/dL — ABNORMAL HIGH (ref 65–99)
Potassium: 4.5 mmol/L (ref 3.5–5.1)
SODIUM: 128 mmol/L — AB (ref 135–145)

## 2015-12-10 LAB — ANTI-SMOOTH MUSCLE ANTIBODY, IGG: F-ACTIN AB IGG: 27 U — AB (ref 0–19)

## 2015-12-10 LAB — ANTINUCLEAR ANTIBODIES, IFA: ANTINUCLEAR ANTIBODIES, IFA: NEGATIVE

## 2015-12-10 NOTE — Progress Notes (Signed)
[facility] PROGRESS NOTE Subjective Still c/o some nausea no abd pain  Objective: Vital signs in last 24 hours: Temp:  [98.4 F (36.9 C)-99.3 F (37.4 C)] 98.6 F (37 C) (05/30 0620) Pulse Rate:  [96-101] 96 (05/30 0620) Resp:  [16] 16 (05/30 0620) BP: (99-105)/(70-76) 104/70 mmHg (05/30 0620) SpO2:  [97 %-100 %] 97 % (05/30 0620) Weight:  [62.642 kg (138 lb 1.6 oz)] 62.642 kg (138 lb 1.6 oz) (05/30 0620) Last BM Date: 12/07/15  Intake/Output from previous day:   Intake/Output this shift:    PE: General--alert no encephalopathy  Abdomen--slight ascites, nontender  Lab Results:  Recent Labs  12/09/15 0322  WBC 22.8*  HGB 10.5*  HCT 32.6*  PLT 286   BMET  Recent Labs  12/08/15 0448 12/09/15 0322 12/10/15 0351  NA 130* 132* 128*  K 3.3* 4.8 4.5  CL 95* 98* 95*  CO2 27 27 26   CREATININE 0.50 0.43* 0.51   LFT  Recent Labs  12/08/15 0448 12/09/15 0322 12/10/15 0351  PROT 6.5 7.2 7.1  AST 98* 133* 153*  ALT 9* 13* 14  ALKPHOS 213* 204* 184*  BILITOT 3.5* 3.8* 3.9*  BILIDIR 2.0* 2.1* 2.3*  IBILI 1.5* 1.7* 1.6*   PT/INR  Recent Labs  12/08/15 0448 12/09/15 0322 12/10/15 0351  LABPROT 19.3* 19.1* 19.5*  INR 1.62* 1.60* 1.65*   PANCREAS No results for input(s): LIPASE in the last 72 hours.       Studies/Results: No results found.  Medications: I have reviewed the patient's current medications.  Assessment/Plan: 1. Alcoholic Hepatitis. Liver function appears to be stabilizing. Would continue to treat the nausea symptomatically and ? Avoid adderall if possible. Strongly feel she should be considered for IP EtOH rehab Dr Matthias HughsBuccini can follow liver as OP. We will check on labs q 2-3 days please call if we are otherwise needed.   Annamaria Salah JR,Martell Mcfadyen L 12/10/2015, 11:27 AM  This note was created using voice recognition software. Minor errors may Have occurred unintentionally.  Pager: [phone] If no answer or after hours call  [phone]

## 2015-12-10 NOTE — Progress Notes (Addendum)
[facility] PROGRESS NOTE    Progress Note  Angela Villegas  ZOX:[id number] DOB: 1991/05/29 DOA: 12/05/2015 PCP: Frederich Chick, MD     Brief Narrative:   Angela Villegas is an 25 y.o. female past medical history a lot all views that comes in for 2 weeks of increased abdominal pain and distention with right upper quadrant tenderness. She started having nausea and vomiting prior to admission CT scan of the abdomen and pelvis was done that showed possible hepatic necrosis was large volume ascites  Assessment/Plan:   Ascites/leukocytosis: Status post ultrasound guided paracentesis with 4.8 L removed, with a total of 236 white blood cells. Blood cultures and ascites fluid cultures are negative, probably her leukocytosis is due to inflammatory liver reaction. Continue fluid restriction and Aldactone and  Lasix. Abdomen is increase in size today, she is complaining of nausea and vomited once, probably due to increase abdominal pressure. We'll discuss with GI to see if she will benefit from paracentesis  Alcoholic hepatitis/Coagulopathy [facility]) Her MELD score 14, her MAddery function is 40 which is quite high.LFTs have increased, and has plateaued today will probably need to recheck them in the morning. ANA, anti-smooth muscle, Ceruloplasmin  plasmin and 24 copper are pending. Appreciate GI assistance.  Alcoholic abuse (HCC) Resolved completed her Ativan protocol.  Hypokalemia: Continue spironolactone, resolved. Basic metabolic panel is pending.  ADHD: Continue Adderall.  DVT prophylaxis: SCDs Family Communication:none Disposition Plan/Barrier to D/C: home in 2 days Code Status:     Code Status Orders        Start     Ordered   12/06/15 0307  Full code   Continuous     12/06/15 0307    Code Status History    Date Active Date Inactive Code Status Order ID Comments User Context   This patient has a current code status but no historical code status.        IV Access:      Peripheral IV   Procedures and diagnostic studies:   No results found.   Medical Consultants:    None.  Anti-Infectives:   Rocephin on 5.26.2017  Subjective:    Angela Villegas abdominal pain resolved.She relates she did not tolerate her diet yesterday she was nauseated and threw up.  Objective:    Filed Vitals:   12/09/15 0535 12/09/15 1347 12/09/15 2120 12/10/15 0620  BP: 95/69 105/70 99/76 104/70  Pulse: 95 99 101 96  Temp: 98.9 F (37.2 C) 98.4 F (36.9 C) 99.3 F (37.4 C) 98.6 F (37 C)  TempSrc: Oral Oral Oral Oral  Resp: Height:      Weight:    62.642 kg (138 lb 1.6 oz)  SpO2: 98% 100% 97% 97%   No intake or output data in the 24 hours ending 12/10/15 0807 Filed Weights   12/07/15 0500 12/07/15 1200 12/10/15 0620  Weight: 64.2 kg (141 lb 8.6 oz) 64.411 kg (142 lb) 62.642 kg (138 lb 1.6 oz)    Exam: General exam: In no acute distress. Respiratory system: Good air movement and clear to auscultation. Cardiovascular system: S1 & S2 heard, RRR. No JVD, murmurs, rubs, gallops or clicks.  Gastrointestinal system: Abdomen Seems to be more distended today. Central nervous system: Alert and oriented. No focal neurological deficits. Extremities: No pedal edema. Skin: No rashes, lesions or ulcers Psychiatry: Judgement and insight appear normal. Mood & affect appropriate.    Data Reviewed:    Labs: Basic Metabolic Panel:  Recent  Labs Lab 12/05/15 2057 12/06/15 0537 12/07/15 0701 12/08/15 0448 12/09/15 0322  NA 138 137 132* 130* 132*  K 3.3* 3.7 3.7 3.3* 4.8  CL 100* 101 97* 95* 98*  CO2 24 25 27 27 27   GLUCOSE 147* 108* 112* 127* 115*  BUN <5* <5* 6 5* 5*  CREATININE 0.39* 0.31* 0.34* 0.50 0.43*  CALCIUM 7.9* 7.6* 7.2* 7.4* 7.9*   GFR Estimated Creatinine Clearance: 91.1 mL/min (by C-G formula based on Cr of 0.43). Liver Function Tests:  Recent Labs Lab 12/06/15 0537 12/07/15 0333 12/08/15 0448 12/09/15 0322  12/10/15 0351  AST 137* 105* 98* 133* 153*  ALT 11* 8* 9* 13* 14  ALKPHOS 256* 232* 213* 204* 184*  BILITOT 2.6* 3.4* 3.5* 3.8* 3.9*  PROT 7.1 6.6 6.5 7.2 7.1  ALBUMIN 3.1* 2.8* 3.0* 3.4* 3.1*    Recent Labs Lab 12/05/15 2057  LIPASE 60*   No results for input(s): AMMONIA in the last 168 hours. Coagulation profile  Recent Labs Lab 12/05/15 2057 12/07/15 0333 12/08/15 0448 12/09/15 0322 12/10/15 0351  INR 1.51* 1.69* 1.62* 1.60* 1.65*    CBC:  Recent Labs Lab 12/05/15 2057 12/06/15 0537 12/07/15 0904 12/09/15 0322  WBC 24.8* 20.3* 22.9* 22.8*  HGB 10.9* 10.1* 9.8* 10.5*  HCT 32.7* 30.5* 29.8* 32.6*  MCV 80.5 81.1 81.6 83.6  PLT 346 337 274 286   Cardiac Enzymes: No results for input(s): CKTOTAL, CKMB, CKMBINDEX, TROPONINI in the last 168 hours. BNP (last 3 results) No results for input(s): PROBNP in the last 8760 hours. CBG: No results for input(s): GLUCAP in the last 168 hours. D-Dimer: No results for input(s): DDIMER in the last 72 hours. Hgb A1c: No results for input(s): HGBA1C in the last 72 hours. Lipid Profile: No results for input(s): CHOL, HDL, LDLCALC, TRIG, CHOLHDL, LDLDIRECT in the last 72 hours. Thyroid function studies: No results for input(s): TSH, T4TOTAL, T3FREE, THYROIDAB in the last 72 hours.  Invalid input(s): FREET3 Anemia work up:  Recent Labs  12/07/15 0904  FERRITIN 85   Sepsis Labs:  Recent Labs Lab 12/05/15 2057 12/06/15 0537 12/07/15 0904 12/09/15 0322  WBC 24.8* 20.3* 22.9* 22.8*   Microbiology Recent Results (from the past 240 hour(s))  Urine culture     Status: Abnormal   Collection Time: 12/05/15  9:00 PM  Result Value Ref Range Status   Specimen Description URINE, CLEAN CATCH  Final   Special Requests NONE  Final   Culture MULTIPLE SPECIES PRESENT, SUGGEST RECOLLECTION (A)  Final   Report Status 12/07/2015 FINAL  Final  Culture, blood (routine x 2)     Status: None (Preliminary result)   Collection  Time: 12/06/15 12:30 AM  Result Value Ref Range Status   Specimen Description BLOOD LEFT ANTECUBITAL  Final   Special Requests BOTTLES DRAWN AEROBIC AND ANAEROBIC 5ML  Final   Culture   Final    NO GROWTH 3 DAYS [facility]    Report Status PENDING  Incomplete  MRSA PCR Screening     Status: Abnormal   Collection Time: 12/06/15  4:06 AM  Result Value Ref Range Status   MRSA by PCR POSITIVE (A) NEGATIVE Final    Comment:        The GeneXpert MRSA Assay (FDA approved for NASAL specimens only), is one component of a comprehensive MRSA colonization surveillance program. It is not intended to diagnose MRSA infection nor to guide or monitor treatment for MRSA infections. RESULT CALLED TO, READ  BACK BY AND VERIFIED WITH: Q MBEMENA RN @ [phone] ON 12/06/15 BY C DAVIS   Culture, blood (routine x 2)     Status: None (Preliminary result)   Collection Time: 12/06/15  6:22 AM  Result Value Ref Range Status   Specimen Description BLOOD LEFT ARM  Final   Special Requests BOTTLES DRAWN AEROBIC ONLY 5CC  Final   Culture   Final    NO GROWTH 3 DAYS Performed at [facility]    Report Status PENDING  Incomplete  Culture, body fluid-bottle     Status: None (Preliminary result)   Collection Time: 12/06/15  2:20 PM  Result Value Ref Range Status   Specimen Description FLUID PERITONEAL  Final   Special Requests BOTTLES DRAWN AEROBIC AND ANAEROBIC 10CC  Final   Culture   Final    NO GROWTH 3 DAYS Performed at [facility]    Report Status PENDING  Incomplete  Gram stain     Status: None   Collection Time: 12/06/15  2:20 PM  Result Value Ref Range Status   Specimen Description FLUID PERITONEAL  Final   Special Requests NONE  Final   Gram Stain FEW MONONUCLEAR CELLS NO ORGANISMS SEEN   Final   Report Status 12/06/2015 FINAL  Final     Medications:   . amphetamine-dextroamphetamine  15 mg Oral QAC breakfast  . amphetamine-dextroamphetamine  10 mg Oral  QPM  . Chlorhexidine Gluconate Cloth  6 each Topical Q0600  . fluticasone  2 spray Each Nare Daily  . folic acid  1 mg Oral Daily  . furosemide  40 mg Oral Daily  . multivitamin with minerals  1 tablet Oral Daily  . mupirocin ointment  1 application Nasal BID  . propranolol  10 mg Oral BID  . sodium chloride flush  3 mL Intravenous Q12H  . spironolactone  100 mg Oral Daily  . thiamine  100 mg Oral Daily   Continuous Infusions:   Time spent: 15 min   LOS: 4 days   Marinda Elk  [facility] Pager [phone]  *Please refer to [email or web address], password [email or web address] to get updated schedule on who will round on this patient, as hospitalists switch teams weekly. If 7PM-7AM, please contact night-coverage at [email or web address], password [email or web address] for any overnight needs.  12/10/2015, 8:07 AM

## 2015-12-10 NOTE — Clinical Social Work Note (Signed)
Clinical Social Work Assessment  Patient Details  Name: Angela Villegas MRN: [id number] Date of Birth: 07-May-1991  Date of referral:  12/10/15               Reason for consult:  Discharge Planning, Substance Use/ETOH Abuse                Permission sought to share information with:  Case Manager, Customer service manager, Family Supports Permission granted to share information::  Yes, Verbal Permission Granted  Name::        Agency::     Relationship::  Husband in room with patient, she is agreeable for him to be part of assessment  Contact Information:     Housing/Transportation Living arrangements for the past 2 months:  Apartment Source of Information:  Patient, Scientist, water quality, Spouse Patient Interpreter Needed:  None Criminal Activity/Legal Involvement Pertinent to Current Situation/Hospitalization:  No - Comment as needed Significant Relationships:  Other Family Members, Spouse Lives with:  Spouse Do you feel safe going back to the place where you live?  Yes Need for family participation in patient care:  Yes (Comment) (per request, husband in room)  Care giving concerns:  Patient denies any care giving concerns at this time. Patient reports she is independent with ADLs and care.  She reports she needs a new PCP due to not liking her current PCP with [facility].  LCSW will place information on DC summary for patient to call number on back of her insurance card for in-network options for PCP.   Social Worker assessment / plan:  LCSW met with patient and permission obtained for husband to remain in room while assessment complete. Patient reports she is open to resources for SA at this time. She is not interested in residential options due to not wanting to put her life on hold.  SBIRT completed for patient and filed in flowsheets. Patient endorses drinking everyday, shots of Vodka 10-12.  There is suspicion that she is using other drugs recreationally as she tested positive for benzos  and opiates. Patient adamantly denies this and reports she may have taking a pain pill one time because her stomach hurt so much, but reports she did this over 6 months ago.  Story is very inconsistent and patient becomes very uncomfortable and changes subject. Patient etoh levels; 303. She is open to CD IOP.  LCSW explained options in area and information given to patient as she is not sure if she wants a referral at this time or not. Patient encouraged to complete CD IOP as she is not working and has ability to go to sessions. She reports she will think about it. Plan will be for her to return to current living situation when medically stable. All resources given to patient.  PCP information placed in AVS for DC needs.  LCSW will sign off at this time, no other needs unless patient requests appointment for CD IOP in which LCSW can arrange and patient is aware.  Employment status:  Unemployed Forensic scientist:  Managed Care PT Recommendations:  Not assessed at this time Information / Referral to community resources:  Outpatient Substance Abuse Treatment Options  Patient/Family's Response to care:  Agreeable to plan  Patient/Family's Understanding of and Emotional Response to Diagnosis, Current Treatment, and Prognosis:  Patient minimizes her alochol use, but reports she wants to seek treatment due to multiple medical problems.  She is guarded when giving information about her SA use especially when questions about other drugs found  in system.    Emotional Assessment Appearance:  Appears stated age Attitude/Demeanor/Rapport:  Inconsistent, Guarded Affect (typically observed):  Accepting, Guarded Orientation:  Oriented to Self, Oriented to Place, Oriented to  Time, Oriented to Situation Alcohol / Substance use:  Alcohol Use Psych involvement (Current and /or in the community):  No (Comment)  Discharge Needs  Concerns to be addressed:  No discharge needs identified Readmission within  the last 30 days:  No Current discharge risk:  Substance Abuse Barriers to Discharge:  No Barriers Identified, Continued Medical Work up   Lilly C[place], LCSW 12/10/2015, 10:43 AM

## 2015-12-11 DIAGNOSIS — K7290 Hepatic failure, unspecified without coma: Secondary | ICD-10-CM

## 2015-12-11 DIAGNOSIS — F1010 Alcohol abuse, uncomplicated: Secondary | ICD-10-CM

## 2015-12-11 DIAGNOSIS — K7011 Alcoholic hepatitis with ascites: Secondary | ICD-10-CM

## 2015-12-11 DIAGNOSIS — E871 Hypo-osmolality and hyponatremia: Secondary | ICD-10-CM

## 2015-12-11 DIAGNOSIS — R188 Other ascites: Secondary | ICD-10-CM

## 2015-12-11 DIAGNOSIS — D689 Coagulation defect, unspecified: Secondary | ICD-10-CM

## 2015-12-11 LAB — HEPATIC FUNCTION PANEL
ALT: 18 U/L (ref 14–54)
AST: 141 U/L — AB (ref 15–41)
Albumin: 3.3 g/dL — ABNORMAL LOW (ref 3.5–5.0)
Alkaline Phosphatase: 184 U/L — ABNORMAL HIGH (ref 38–126)
BILIRUBIN DIRECT: 2.2 mg/dL — AB (ref 0.1–0.5)
BILIRUBIN TOTAL: 3.7 mg/dL — AB (ref 0.3–1.2)
Indirect Bilirubin: 1.5 mg/dL — ABNORMAL HIGH (ref 0.3–0.9)
Total Protein: 7.3 g/dL (ref 6.5–8.1)

## 2015-12-11 LAB — CULTURE, BLOOD (ROUTINE X 2): CULTURE: NO GROWTH

## 2015-12-11 LAB — BLOOD CULTURE ID PANEL (REFLEXED)
Acinetobacter baumannii: NOT DETECTED
CANDIDA GLABRATA: NOT DETECTED
CANDIDA KRUSEI: NOT DETECTED
CANDIDA TROPICALIS: NOT DETECTED
Candida albicans: NOT DETECTED
Candida parapsilosis: NOT DETECTED
Carbapenem resistance: NOT DETECTED
ESCHERICHIA COLI: NOT DETECTED
Enterobacter cloacae complex: NOT DETECTED
Enterobacteriaceae species: NOT DETECTED
Enterococcus species: NOT DETECTED
Haemophilus influenzae: NOT DETECTED
KLEBSIELLA OXYTOCA: NOT DETECTED
Klebsiella pneumoniae: NOT DETECTED
LISTERIA MONOCYTOGENES: NOT DETECTED
Methicillin resistance: NOT DETECTED
NEISSERIA MENINGITIDIS: NOT DETECTED
PROTEUS SPECIES: NOT DETECTED
Pseudomonas aeruginosa: NOT DETECTED
SERRATIA MARCESCENS: NOT DETECTED
STREPTOCOCCUS SPECIES: NOT DETECTED
Staphylococcus aureus (BCID): NOT DETECTED
Staphylococcus species: NOT DETECTED
Streptococcus agalactiae: NOT DETECTED
Streptococcus pneumoniae: NOT DETECTED
Streptococcus pyogenes: NOT DETECTED
Vancomycin resistance: NOT DETECTED

## 2015-12-11 LAB — CULTURE, BODY FLUID W GRAM STAIN -BOTTLE

## 2015-12-11 LAB — BASIC METABOLIC PANEL
Anion gap: 7 (ref 5–15)
BUN: <5 mg/dL — ABNORMAL LOW (ref 6–20)
CO2: 27 mmol/L (ref 22–32)
CREATININE: 0.5 mg/dL (ref 0.44–1.00)
Calcium: 8.2 mg/dL — ABNORMAL LOW (ref 8.9–10.3)
Chloride: 93 mmol/L — ABNORMAL LOW (ref 101–111)
GFR calc Af Amer: >60 mL/min (ref >60)
GFR calc non Af Amer: >60 mL/min (ref >60)
GLUCOSE: 129 mg/dL — AB (ref 65–99)
Potassium: 4 mmol/L (ref 3.5–5.1)
Sodium: 127 mmol/L — ABNORMAL LOW (ref 135–145)

## 2015-12-11 LAB — CULTURE, BODY FLUID-BOTTLE: CULTURE: NO GROWTH

## 2015-12-11 MED ORDER — SENNOSIDES-DOCUSATE SODIUM 8.6-50 MG PO TABS
2.0000 | ORAL_TABLET | Freq: Two times a day (BID) | ORAL | Status: DC
  Administered 2015-12-11: 2 via ORAL
  Filled 2015-12-11 (×2): qty 2

## 2015-12-11 MED ORDER — POLYETHYLENE GLYCOL 3350 17 G PO PACK
17.0000 g | PACK | Freq: Every day | ORAL | Status: DC
  Administered 2015-12-11: 17 g via ORAL
  Filled 2015-12-11 (×2): qty 1

## 2015-12-11 NOTE — Progress Notes (Signed)
[facility] PROGRESS NOTE    Progress Note  Angela Villegas  WUJ:[id number] DOB: Mar 04, 1991 DOA: 12/05/2015 PCP: Frederich Chick, MD     Brief Narrative:   Angela Villegas is an 25 y.o. female past medical history a lot all views that comes in for 2 weeks of increased abdominal pain and distention with right upper quadrant tenderness. She started having nausea and vomiting prior to admission CT scan of the abdomen and pelvis was done that showed possible hepatic necrosis was large volume ascites  Assessment/Plan:   Ascites/leukocytosis: Status post ultrasound guided paracentesis with 4.8 L removed, with a total of 236 white blood cells. Blood cultures and ascites fluid cultures are negative, probably her leukocytosis is due to inflammatory liver reaction. Continue fluid restriction and Aldactone and  Lasix. Abdomen is increase in size today, she is complaining of nausea and vomited once, probably due to increase abdominal pressure.  proceed with repeat paracentesis  Alcoholic hepatitis/Coagulopathy (HCC) Her MELD score 14, her MAddery function is 40 which is quite high. LFTs has plateaued on 5/30,  ANA negative, anti-smooth muscle weak positive, Ceruloplasmin wnl  and 24 copper, no deficiency of a1 antitrypsin, hepatitis panel negative Appreciate GI assistance.  Alcoholic abuse (HCC)  completed her Ativan protocol. Social worker consulted, CD IOP info provided.  Hypokalemia: Continue spironolactone, resolved. Basic metabolic panel is pending.  Hyponatremia: from hypervolemia? +/- diuretics. Continue monitor  ADHD: Continue Adderall.  DVT prophylaxis: SCDs Family Communication:patient and her husband in room Disposition Plan/Barrier to D/C: home in  A few days, if ab pain better, and able to tolerate oral intake Code Status:     Code Status Orders        Start     Ordered   12/06/15 0307  Full code   Continuous     12/06/15 0307    Code Status History    Date  Active Date Inactive Code Status Order ID Comments User Context   This patient has a current code status but no historical code status.        IV Access:    Peripheral IV   Procedures and diagnostic studies:   No results found.   Medical Consultants:    None.  Anti-Infectives:   Rocephin on 5.26.2017  Subjective:    [name] c/o abdominal pain again, needing pain meds, persistent n/v, not tolerating oral intake, no bm x3 days, no fever, no significant alcohol withdrawal symptoms, husband in room.  Objective:    Filed Vitals:   12/10/15 0620 12/10/15 1255 12/10/15 2210 12/11/15 0615  BP: 104/70 100/77 104/77 105/76  Pulse: 96 97 103 97  Temp: 98.6 F (37 C) 98.4 F (36.9 C) 98.7 F (37.1 C) 99.3 F (37.4 C)  TempSrc: Oral Oral Oral Oral  Resp: 16 16 16 16   Height:      Weight: 62.642 kg (138 lb 1.6 oz)   63.549 kg (140 lb 1.6 oz)  SpO2: 97% 99% 99% 96%    Intake/Output Summary (Last 24 hours) at 12/11/15 1009 Last data filed at 12/10/15 1900  Gross per 24 hour  Intake    360 ml  Output      0 ml  Net    360 ml   Filed Weights   12/07/15 1200 12/10/15 0620 12/11/15 0615  Weight: 64.411 kg (142 lb) 62.642 kg (138 lb 1.6 oz) 63.549 kg (140 lb 1.6 oz)    Exam: General exam: In no acute distress. Respiratory system: Good air movement  and clear to auscultation. Cardiovascular system: S1 & S2 heard, RRR. No JVD, murmurs, rubs, gallops or clicks.  Gastrointestinal system: Abdomen Seems to be more distended today. + tender, Central nervous system: Alert and oriented. No focal neurological deficits. Extremities: No pedal edema. Skin: No rashes, lesions or ulcers Psychiatry: Judgement and insight appear normal. Mood & affect appropriate.    Data Reviewed:    Labs: Basic Metabolic Panel:  Recent Labs Lab 12/07/15 0701 12/08/15 0448 12/09/15 0322 12/10/15 0351 12/11/15 0345  NA 132* 130* 132* 128* 127*  K 3.7 3.3* 4.8 4.5 4.0  CL 97* 95*  98* 95* 93*  CO2 27 27 27 26 27   GLUCOSE 112* 127* 115* 104* 129*  BUN 6 5* 5* <5* <5*  CREATININE 0.34* 0.50 0.43* 0.51 0.50  CALCIUM 7.2* 7.4* 7.9* 8.1* 8.2*   GFR Estimated Creatinine Clearance: 91.8 mL/min (by C-G formula based on Cr of 0.5). Liver Function Tests:  Recent Labs Lab 12/07/15 0333 12/08/15 0448 12/09/15 0322 12/10/15 0351 12/11/15 0345  AST 105* 98* 133* 153* 141*  ALT 8* 9* 13* 14 18  ALKPHOS 232* 213* 204* 184* 184*  BILITOT 3.4* 3.5* 3.8* 3.9* 3.7*  PROT 6.6 6.5 7.2 7.1 7.3  ALBUMIN 2.8* 3.0* 3.4* 3.1* 3.3*    Recent Labs Lab 12/05/15 2057  LIPASE 60*   No results for input(s): AMMONIA in the last 168 hours. Coagulation profile  Recent Labs Lab 12/05/15 2057 12/07/15 0333 12/08/15 0448 12/09/15 0322 12/10/15 0351  INR 1.51* 1.69* 1.62* 1.60* 1.65*    CBC:  Recent Labs Lab 12/05/15 2057 12/06/15 0537 12/07/15 0904 12/09/15 0322  WBC 24.8* 20.3* 22.9* 22.8*  HGB 10.9* 10.1* 9.8* 10.5*  HCT 32.7* 30.5* 29.8* 32.6*  MCV 80.5 81.1 81.6 83.6  PLT 346 337 274 286   Cardiac Enzymes: No results for input(s): CKTOTAL, CKMB, CKMBINDEX, TROPONINI in the last 168 hours. BNP (last 3 results) No results for input(s): PROBNP in the last 8760 hours. CBG: No results for input(s): GLUCAP in the last 168 hours. D-Dimer: No results for input(s): DDIMER in the last 72 hours. Hgb A1c: No results for input(s): HGBA1C in the last 72 hours. Lipid Profile: No results for input(s): CHOL, HDL, LDLCALC, TRIG, CHOLHDL, LDLDIRECT in the last 72 hours. Thyroid function studies: No results for input(s): TSH, T4TOTAL, T3FREE, THYROIDAB in the last 72 hours.  Invalid input(s): FREET3 Anemia work up: No results for input(s): VITAMINB12, FOLATE, FERRITIN, TIBC, IRON, RETICCTPCT in the last 72 hours. Sepsis Labs:  Recent Labs Lab 12/05/15 2057 12/06/15 0537 12/07/15 0904 12/09/15 0322  WBC 24.8* 20.3* 22.9* 22.8*   Microbiology Recent Results (from  the past 240 hour(s))  Urine culture     Status: Abnormal   Collection Time: 12/05/15  9:00 PM  Result Value Ref Range Status   Specimen Description URINE, CLEAN CATCH  Final   Special Requests NONE  Final   Culture MULTIPLE SPECIES PRESENT, SUGGEST RECOLLECTION (A)  Final   Report Status 12/07/2015 FINAL  Final  Culture, blood (routine x 2)     Status: None (Preliminary result)   Collection Time: 12/06/15 12:30 AM  Result Value Ref Range Status   Specimen Description BLOOD LEFT ANTECUBITAL  Final   Special Requests BOTTLES DRAWN AEROBIC AND ANAEROBIC 5ML  Final   Culture  Setup Time   Final    GRAM POSITIVE RODS ANAEROBIC BOTTLE ONLY CRITICAL RESULT CALLED TO, READ BACK BY AND VERIFIED WITH: MICK GLOGOVAC,PHARMD @ [phone] 12/11/15  MKELLY Performed at [facility]    Culture GRAM POSITIVE RODS  Final   Report Status PENDING  Incomplete  Blood Culture ID Panel (Reflexed)     Status: None   Collection Time: 12/06/15 12:30 AM  Result Value Ref Range Status   Enterococcus species NOT DETECTED NOT DETECTED Final   Vancomycin resistance NOT DETECTED NOT DETECTED Final   Listeria monocytogenes NOT DETECTED NOT DETECTED Final   Staphylococcus species NOT DETECTED NOT DETECTED Final   Staphylococcus aureus NOT DETECTED NOT DETECTED Final   Methicillin resistance NOT DETECTED NOT DETECTED Final   Streptococcus species NOT DETECTED NOT DETECTED Final   Streptococcus agalactiae NOT DETECTED NOT DETECTED Final   Streptococcus pneumoniae NOT DETECTED NOT DETECTED Final   Streptococcus pyogenes NOT DETECTED NOT DETECTED Final   Acinetobacter baumannii NOT DETECTED NOT DETECTED Final   Enterobacteriaceae species NOT DETECTED NOT DETECTED Final   Enterobacter cloacae complex NOT DETECTED NOT DETECTED Final   Escherichia coli NOT DETECTED NOT DETECTED Final   Klebsiella oxytoca NOT DETECTED NOT DETECTED Final   Klebsiella pneumoniae NOT DETECTED NOT DETECTED Final   Proteus species NOT  DETECTED NOT DETECTED Final   Serratia marcescens NOT DETECTED NOT DETECTED Final   Carbapenem resistance NOT DETECTED NOT DETECTED Final   Haemophilus influenzae NOT DETECTED NOT DETECTED Final   Neisseria meningitidis NOT DETECTED NOT DETECTED Final   Pseudomonas aeruginosa NOT DETECTED NOT DETECTED Final   Candida albicans NOT DETECTED NOT DETECTED Final   Candida glabrata NOT DETECTED NOT DETECTED Final   Candida krusei NOT DETECTED NOT DETECTED Final   Candida parapsilosis NOT DETECTED NOT DETECTED Final   Candida tropicalis NOT DETECTED NOT DETECTED Final    Comment: Performed at [facility]  MRSA PCR Screening     Status: Abnormal   Collection Time: 12/06/15  4:06 AM  Result Value Ref Range Status   MRSA by PCR POSITIVE (A) NEGATIVE Final    Comment:        The GeneXpert MRSA Assay (FDA approved for NASAL specimens only), is one component of a comprehensive MRSA colonization surveillance program. It is not intended to diagnose MRSA infection nor to guide or monitor treatment for MRSA infections. RESULT CALLED TO, READ BACK BY AND VERIFIED WITH: Q MBEMENA RN @ [phone] ON 12/06/15 BY C DAVIS   Culture, blood (routine x 2)     Status: None (Preliminary result)   Collection Time: 12/06/15  6:22 AM  Result Value Ref Range Status   Specimen Description BLOOD LEFT ARM  Final   Special Requests BOTTLES DRAWN AEROBIC ONLY 5CC  Final   Culture   Final    NO GROWTH 4 DAYS Performed at [facility]    Report Status PENDING  Incomplete  Culture, body fluid-bottle     Status: None (Preliminary result)   Collection Time: 12/06/15  2:20 PM  Result Value Ref Range Status   Specimen Description FLUID PERITONEAL  Final   Special Requests BOTTLES DRAWN AEROBIC AND ANAEROBIC 10CC  Final   Culture   Final    NO GROWTH 4 DAYS Performed at [facility]    Report Status PENDING  Incomplete  Gram stain     Status: None   Collection Time: 12/06/15  2:20 PM  Result  Value Ref Range Status   Specimen Description FLUID PERITONEAL  Final   Special Requests NONE  Final   Gram Stain FEW MONONUCLEAR CELLS NO ORGANISMS SEEN   Final  Report Status 12/06/2015 FINAL  Final     Medications:   . amphetamine-dextroamphetamine  15 mg Oral QAC breakfast  . amphetamine-dextroamphetamine  10 mg Oral QPM  . fluticasone  2 spray Each Nare Daily  . folic acid  1 mg Oral Daily  . furosemide  40 mg Oral Daily  . multivitamin with minerals  1 tablet Oral Daily  . propranolol  10 mg Oral BID  . sodium chloride flush  3 mL Intravenous Q12H  . spironolactone  100 mg Oral Daily  . thiamine  100 mg Oral Daily   Continuous Infusions:   Time spent: 25 min   LOS: 5 days   Undra Trembath MD PhD  [facility] Pager [phone]  *Please refer to [email or web address], password [email or web address] to get updated schedule on who will round on this patient, as hospitalists switch teams weekly. If 7PM-7AM, please contact night-coverage at [email or web address], password [email or web address] for any overnight needs.  12/11/2015, 10:09 AM

## 2015-12-11 NOTE — Plan of Care (Signed)
Problem: Bowel/Gastric: Goal: Will not experience complications related to bowel motility Outcome: Progressing BM today after miralax and senna

## 2015-12-12 ENCOUNTER — Inpatient Hospital Stay (HOSPITAL_COMMUNITY): Payer: BC Managed Care – PPO

## 2015-12-12 LAB — BODY FLUID CELL COUNT WITH DIFFERENTIAL
Lymphs, Fluid: 20 %
MONOCYTE-MACROPHAGE-SEROUS FLUID: 74 % (ref 50–90)
NEUTROPHIL FLUID: 6 % (ref 0–25)
WBC FLUID: 310 uL (ref 0–1000)

## 2015-12-12 LAB — LACTATE DEHYDROGENASE, PLEURAL OR PERITONEAL FLUID: LD FL: 94 U/L — AB (ref 3–23)

## 2015-12-12 LAB — CBC
HEMATOCRIT: 30.1 % — AB (ref 36.0–46.0)
Hemoglobin: 10 g/dL — ABNORMAL LOW (ref 12.0–15.0)
MCH: 27 pg (ref 26.0–34.0)
MCHC: 33.2 g/dL (ref 30.0–36.0)
MCV: 81.4 fL (ref 78.0–100.0)
PLATELETS: 278 10*3/uL (ref 150–400)
RBC: 3.7 MIL/uL — ABNORMAL LOW (ref 3.87–5.11)
RDW: 19.2 % — AB (ref 11.5–15.5)
WBC: 21.1 10*3/uL — AB (ref 4.0–10.5)

## 2015-12-12 LAB — HEPATIC FUNCTION PANEL
ALBUMIN: 3.4 g/dL — AB (ref 3.5–5.0)
ALT: 19 U/L (ref 14–54)
AST: 151 U/L — AB (ref 15–41)
Alkaline Phosphatase: 169 U/L — ABNORMAL HIGH (ref 38–126)
BILIRUBIN DIRECT: 2.2 mg/dL — AB (ref 0.1–0.5)
BILIRUBIN TOTAL: 3.6 mg/dL — AB (ref 0.3–1.2)
Indirect Bilirubin: 1.4 mg/dL — ABNORMAL HIGH (ref 0.3–0.9)
Total Protein: 7.2 g/dL (ref 6.5–8.1)

## 2015-12-12 LAB — BASIC METABOLIC PANEL
Anion gap: 9 (ref 5–15)
CO2: 25 mmol/L (ref 22–32)
CREATININE: 0.41 mg/dL — AB (ref 0.44–1.00)
Calcium: 8.3 mg/dL — ABNORMAL LOW (ref 8.9–10.3)
Chloride: 94 mmol/L — ABNORMAL LOW (ref 101–111)
GFR calc Af Amer: >60 mL/min (ref >60)
GLUCOSE: 128 mg/dL — AB (ref 65–99)
POTASSIUM: 3.6 mmol/L (ref 3.5–5.1)
SODIUM: 128 mmol/L — AB (ref 135–145)

## 2015-12-12 LAB — ALBUMIN, FLUID (OTHER): ALBUMIN FL: 2.1 g/dL

## 2015-12-12 LAB — PROTIME-INR
INR: 1.74 — AB (ref 0.00–1.49)
PROTHROMBIN TIME: 19.7 s — AB (ref 11.6–15.2)

## 2015-12-12 LAB — GRAM STAIN

## 2015-12-12 LAB — PROTEIN, BODY FLUID: Total protein, fluid: 4.2 g/dL

## 2015-12-12 NOTE — Progress Notes (Signed)
[facility] PROGRESS NOTE    Progress Note  Angela Villegas  HQI:[id number] DOB: 1990-08-27 DOA: 12/05/2015 PCP: Frederich Chick, MD     Brief Narrative:   Angela Villegas is an 25 y.o. female past medical history a lot all views that comes in for 2 weeks of increased abdominal pain and distention with right upper quadrant tenderness. She started having nausea and vomiting prior to admission CT scan of the abdomen and pelvis was done that showed possible hepatic necrosis was large volume ascites  Assessment/Plan:   Ascites/leukocytosis: Status post ultrasound guided paracentesis with 4.8 L removed, with a total of 236 white blood cells. Blood cultures and ascites fluid cultures are negative, probably her leukocytosis is due to inflammatory liver reaction. Continue fluid restriction and Aldactone and  Lasix. Abdomen is increase in size today, she is complaining of nausea and vomited once, probably due to increase abdominal pressure.  proceed with repeat paracentesis with 1.7 liter fluids removed, fluid gram stain no organism, wbc 310 with 6% neutrophil. Patient reported feeling better after paracentesis  Alcoholic hepatitis/Coagulopathy [facility]) Her MELD score 14, her MAddery function is 40 which is quite high. LFTs has plateaued on 5/30,  ANA negative, anti-smooth muscle weak positive, Ceruloplasmin wnl  and 24 copper, no deficiency of a1 antitrypsin, hepatitis panel negative Appreciate GI assistance.  Alcoholic abuse (HCC)  completed her Ativan protocol. Social worker consulted, CD IOP info provided.  Hypokalemia: Continue spironolactone, resolved. Basic metabolic panel is pending.  Hyponatremia: from hypervolemia? +/- diuretics. Continue monitor  ADHD: Continue Adderall.  DVT prophylaxis: SCDs Family Communication:patient and her husband in room Disposition Plan/Barrier to D/C: home in  A few days, if ab pain better, and able to tolerate oral intake Code Status:      Code Status Orders        Start     Ordered   12/06/15 0307  Full code   Continuous     12/06/15 0307    Code Status History    Date Active Date Inactive Code Status Order ID Comments User Context   This patient has a current code status but no historical code status.        IV Access:    Peripheral IV   Procedures and diagnostic studies:   US Paracentesis  12/12/2015  INDICATION: 25 year old female with alcoholic cirrhosis with recurrent abdominal ascites. Request has been made for diagnostic and therapeutic paracentesis EXAM: ULTRASOUND GUIDED DIAGNOSTIC AND THERAPEUTIC PARACENTESIS MEDICATIONS: 1% lidocaine COMPLICATIONS: None immediate. PROCEDURE: Informed written consent was obtained from the patient after a discussion of the risks, benefits and alternatives to treatment. A timeout was performed prior to the initiation of the procedure. Initial ultrasound scanning demonstrates a small amount of ascites within the left lower abdominal quadrant. The left lower abdomen was prepped and draped in the usual sterile fashion. 1% lidocaine was used for local anesthesia. Following this, a Safe-T-Centesis catheter was introduced. The paracentesis was performed. The catheter was removed and a dressing was applied. The patient tolerated the procedure well without immediate post procedural complication. FINDINGS: A total of approximately 1.7 L of amber fluid was removed. Samples were sent to the laboratory as requested by the clinical team. IMPRESSION: Successful ultrasound-guided paracentesis yielding 1.7 liters of peritoneal fluid. Read by: Barnetta Chapel, PA-C Electronically Signed   By: Corlis Leak M.D.   On: 12/12/2015 12:54     Medical Consultants:    None.  Anti-Infectives:   Rocephin on 5.26.2017  Subjective:  [name] c/o abdominal pain again, needing pain meds, persistent n/v, not tolerating oral intake, no bm x3 days, no fever, no significant alcohol withdrawal  symptoms, husband in room.  Objective:    Filed Vitals:   12/12/15 1030 12/12/15 1045 12/12/15 1055 12/12/15 1249  BP: 111/83 109/78 106/73 104/74  Pulse:    90  Temp:    98.4 F (36.9 C)  TempSrc:      Resp:    15  Height:      Weight:      SpO2:    99%   No intake or output data in the 24 hours ending 12/12/15 2005 Filed Weights   12/10/15 0620 12/11/15 0615 12/12/15 0615  Weight: 62.642 kg (138 lb 1.6 oz) 63.549 kg (140 lb 1.6 oz) 64.728 kg (142 lb 11.2 oz)    Exam: General exam: In no acute distress. Respiratory system: Good air movement and clear to auscultation. Cardiovascular system: S1 & S2 heard, RRR. No JVD, murmurs, rubs, gallops or clicks.  Gastrointestinal system: Abdomen Seems to be more distended today. + tender, Central nervous system: Alert and oriented. No focal neurological deficits. Extremities: No pedal edema. Skin: No rashes, lesions or ulcers Psychiatry: Judgement and insight appear normal. Mood & affect appropriate.    Data Reviewed:    Labs: Basic Metabolic Panel:  Recent Labs Lab 12/08/15 0448 12/09/15 0322 12/10/15 0351 12/11/15 0345 12/12/15 0353  NA 130* 132* 128* 127* 128*  K 3.3* 4.8 4.5 4.0 3.6  CL 95* 98* 95* 93* 94*  CO2 27 27 26 27 25   GLUCOSE 127* 115* 104* 129* 128*  BUN 5* 5* <5* <5* <5*  CREATININE 0.50 0.43* 0.51 0.50 0.41*  CALCIUM 7.4* 7.9* 8.1* 8.2* 8.3*   GFR Estimated Creatinine Clearance: 92.7 mL/min (by C-G formula based on Cr of 0.41). Liver Function Tests:  Recent Labs Lab 12/08/15 0448 12/09/15 0322 12/10/15 0351 12/11/15 0345 12/12/15 0353  AST 98* 133* 153* 141* 151*  ALT 9* 13* 14 18 19   ALKPHOS 213* 204* 184* 184* 169*  BILITOT 3.5* 3.8* 3.9* 3.7* 3.6*  PROT 6.5 7.2 7.1 7.3 7.2  ALBUMIN 3.0* 3.4* 3.1* 3.3* 3.4*    Recent Labs Lab 12/05/15 2057  LIPASE 60*   No results for input(s): AMMONIA in the last 168 hours. Coagulation profile  Recent Labs Lab 12/07/15 0333 12/08/15 0448  12/09/15 0322 12/10/15 0351 12/12/15 0353  INR 1.69* 1.62* 1.60* 1.65* 1.74*    CBC:  Recent Labs Lab 12/05/15 2057 12/06/15 0537 12/07/15 0904 12/09/15 0322 12/12/15 0353  WBC 24.8* 20.3* 22.9* 22.8* 21.1*  HGB 10.9* 10.1* 9.8* 10.5* 10.0*  HCT 32.7* 30.5* 29.8* 32.6* 30.1*  MCV 80.5 81.1 81.6 83.6 81.4  PLT 346 337 274 286 278   Cardiac Enzymes: No results for input(s): CKTOTAL, CKMB, CKMBINDEX, TROPONINI in the last 168 hours. BNP (last 3 results) No results for input(s): PROBNP in the last 8760 hours. CBG: No results for input(s): GLUCAP in the last 168 hours. D-Dimer: No results for input(s): DDIMER in the last 72 hours. Hgb A1c: No results for input(s): HGBA1C in the last 72 hours. Lipid Profile: No results for input(s): CHOL, HDL, LDLCALC, TRIG, CHOLHDL, LDLDIRECT in the last 72 hours. Thyroid function studies: No results for input(s): TSH, T4TOTAL, T3FREE, THYROIDAB in the last 72 hours.  Invalid input(s): FREET3 Anemia work up: No results for input(s): VITAMINB12, FOLATE, FERRITIN, TIBC, IRON, RETICCTPCT in the last 72 hours. Sepsis Labs:  Recent Labs Lab  12/06/15 0537 12/07/15 0904 12/09/15 0322 12/12/15 0353  WBC 20.3* 22.9* 22.8* 21.1*   Microbiology Recent Results (from the past 240 hour(s))  Urine culture     Status: Abnormal   Collection Time: 12/05/15  9:00 PM  Result Value Ref Range Status   Specimen Description URINE, CLEAN CATCH  Final   Special Requests NONE  Final   Culture MULTIPLE SPECIES PRESENT, SUGGEST RECOLLECTION (A)  Final   Report Status 12/07/2015 FINAL  Final  Culture, blood (routine x 2)     Status: None (Preliminary result)   Collection Time: 12/06/15 12:30 AM  Result Value Ref Range Status   Specimen Description BLOOD LEFT ANTECUBITAL  Final   Special Requests BOTTLES DRAWN AEROBIC AND ANAEROBIC  Final   Culture  Setup Time   Final    GRAM POSITIVE RODS ANAEROBIC BOTTLE ONLY CRITICAL RESULT CALLED TO, READ BACK  BY AND VERIFIED WITH: MICK GLOGOVAC,PHARMD @ 0709 12/11/15 MKELLY    Culture   Final    GRAM POSITIVE RODS CULTURE REINCUBATED FOR BETTER GROWTH Performed at [facility]    Report Status PENDING  Incomplete  Blood Culture ID Panel (Reflexed)     Status: None   Collection Time: 12/06/15 12:30 AM  Result Value Ref Range Status   Enterococcus species NOT DETECTED NOT DETECTED Final   Vancomycin resistance NOT DETECTED NOT DETECTED Final   Listeria monocytogenes NOT DETECTED NOT DETECTED Final   Staphylococcus species NOT DETECTED NOT DETECTED Final   Staphylococcus aureus NOT DETECTED NOT DETECTED Final   Methicillin resistance NOT DETECTED NOT DETECTED Final   Streptococcus species NOT DETECTED NOT DETECTED Final   Streptococcus agalactiae NOT DETECTED NOT DETECTED Final   Streptococcus pneumoniae NOT DETECTED NOT DETECTED Final   Streptococcus pyogenes NOT DETECTED NOT DETECTED Final   Acinetobacter baumannii NOT DETECTED NOT DETECTED Final   Enterobacteriaceae species NOT DETECTED NOT DETECTED Final   Enterobacter cloacae complex NOT DETECTED NOT DETECTED Final   Escherichia coli NOT DETECTED NOT DETECTED Final   Klebsiella oxytoca NOT DETECTED NOT DETECTED Final   Klebsiella pneumoniae NOT DETECTED NOT DETECTED Final   Proteus species NOT DETECTED NOT DETECTED Final   Serratia marcescens NOT DETECTED NOT DETECTED Final   Carbapenem resistance NOT DETECTED NOT DETECTED Final   Haemophilus influenzae NOT DETECTED NOT DETECTED Final   Neisseria meningitidis NOT DETECTED NOT DETECTED Final   Pseudomonas aeruginosa NOT DETECTED NOT DETECTED Final   Candida albicans NOT DETECTED NOT DETECTED Final   Candida glabrata NOT DETECTED NOT DETECTED Final   Candida krusei NOT DETECTED NOT DETECTED Final   Candida parapsilosis NOT DETECTED NOT DETECTED Final   Candida tropicalis NOT DETECTED NOT DETECTED Final    Comment: Performed at [facility]  MRSA PCR Screening      Status: Abnormal   Collection Time: 12/06/15  4:06 AM  Result Value Ref Range Status   MRSA by PCR POSITIVE (A) NEGATIVE Final    Comment:        The GeneXpert MRSA Assay (FDA approved for NASAL specimens only), is one component of a comprehensive MRSA colonization surveillance program. It is not intended to diagnose MRSA infection nor to guide or monitor treatment for MRSA infections. RESULT CALLED TO, READ BACK BY AND VERIFIED WITH: Q MBEMENA RN @ [phone] ON 12/06/15 BY C DAVIS   Culture, blood (routine x 2)     Status: None   Collection Time: 12/06/15  6:22 AM  Result Value Ref  Range Status   Specimen Description BLOOD LEFT ARM  Final   Special Requests BOTTLES DRAWN AEROBIC ONLY 5CC  Final   Culture   Final    NO GROWTH 5 DAYS Performed at [facility]    Report Status 12/11/2015 FINAL  Final  Culture, body fluid-bottle     Status: None   Collection Time: 12/06/15  2:20 PM  Result Value Ref Range Status   Specimen Description FLUID PERITONEAL  Final   Special Requests BOTTLES DRAWN AEROBIC AND ANAEROBIC 10CC  Final   Culture   Final    NO GROWTH 5 DAYS Performed at [facility]    Report Status 12/11/2015 FINAL  Final  Gram stain     Status: None   Collection Time: 12/06/15  2:20 PM  Result Value Ref Range Status   Specimen Description FLUID PERITONEAL  Final   Special Requests NONE  Final   Gram Stain FEW MONONUCLEAR CELLS NO ORGANISMS SEEN   Final   Report Status 12/06/2015 FINAL  Final  Gram stain     Status: None   Collection Time: 12/12/15 11:29 AM  Result Value Ref Range Status   Specimen Description FLUID PERITONEAL  Final   Special Requests NONE  Final   Gram Stain   Final    FEW WBC PRESENT,BOTH PMN AND MONONUCLEAR NO ORGANISMS SEEN    Report Status 12/12/2015 FINAL  Final     Medications:   . amphetamine-dextroamphetamine  15 mg Oral QAC breakfast  . amphetamine-dextroamphetamine  10 mg Oral QPM  . fluticasone  2 spray Each Nare  Daily  . folic acid  1 mg Oral Daily  . furosemide  40 mg Oral Daily  . multivitamin with minerals  1 tablet Oral Daily  . propranolol  10 mg Oral BID  . sodium chloride flush  3 mL Intravenous Q12H  . spironolactone  100 mg Oral Daily  . thiamine  100 mg Oral Daily   Continuous Infusions:   Time spent: 25 min   LOS: 6 days   Brandace Cargle MD PhD  [facility] Pager [phone]  *Please refer to [email or web address], password [email or web address] to get updated schedule on who will round on this patient, as hospitalists switch teams weekly. If 7PM-7AM, please contact night-coverage at [email or web address], password [email or web address] for any overnight needs.  12/12/2015, 8:05 PM

## 2015-12-12 NOTE — Procedures (Signed)
Ultrasound-guided diagnostic and therapeutic paracentesis performed yielding 1.7 liters of amber colored fluid. No immediate complications.  Angela Villegas E 12/12/2015

## 2015-12-13 DIAGNOSIS — K7200 Acute and subacute hepatic failure without coma: Secondary | ICD-10-CM

## 2015-12-13 LAB — COMPREHENSIVE METABOLIC PANEL
ALT: 20 U/L (ref 14–54)
AST: 126 U/L — AB (ref 15–41)
Albumin: 3.2 g/dL — ABNORMAL LOW (ref 3.5–5.0)
Alkaline Phosphatase: 158 U/L — ABNORMAL HIGH (ref 38–126)
Anion gap: 7 (ref 5–15)
BILIRUBIN TOTAL: 3.5 mg/dL — AB (ref 0.3–1.2)
CALCIUM: 8.1 mg/dL — AB (ref 8.9–10.3)
CO2: 26 mmol/L (ref 22–32)
CREATININE: 0.39 mg/dL — AB (ref 0.44–1.00)
Chloride: 94 mmol/L — ABNORMAL LOW (ref 101–111)
GFR calc Af Amer: >60 mL/min (ref >60)
Glucose, Bld: 124 mg/dL — ABNORMAL HIGH (ref 65–99)
Potassium: 3.6 mmol/L (ref 3.5–5.1)
Sodium: 127 mmol/L — ABNORMAL LOW (ref 135–145)
TOTAL PROTEIN: 7.2 g/dL (ref 6.5–8.1)

## 2015-12-13 LAB — PROTIME-INR
INR: 1.82 — ABNORMAL HIGH (ref 0.00–1.49)
Prothrombin Time: 20.4 seconds — ABNORMAL HIGH (ref 11.6–15.2)

## 2015-12-13 LAB — CULTURE, BLOOD (ROUTINE X 2)

## 2015-12-13 LAB — MAGNESIUM: MAGNESIUM: 1.8 mg/dL (ref 1.7–2.4)

## 2015-12-13 MED ORDER — OXYCODONE HCL 5 MG PO CAPS: 5.0000 mg | ORAL_CAPSULE | ORAL | Status: DC | PRN

## 2015-12-13 MED ORDER — SPIRONOLACTONE 100 MG PO TABS: 100.0000 mg | ORAL_TABLET | Freq: Every day | ORAL | Status: DC

## 2015-12-13 MED ORDER — FUROSEMIDE 40 MG PO TABS: 40.0000 mg | ORAL_TABLET | Freq: Every day | ORAL | Status: DC

## 2015-12-13 NOTE — Discharge Summary (Signed)
Discharge Summary  Angela Villegas XBM:[id number] DOB: [birth year]  PCP: Frederich ChickWEBB, CAROL D, MD  Admit date: 12/05/2015 Discharge date: 12/13/2015  Time spent: >4430mins  Recommendations for Outpatient Follow-up:  1. F/u with PMD within a week  for hospital discharge follow up, repeat cbc/bmp at follow up 2. F/u with GI for alcohol hepatitis/ascites 3. F/u with behavioral health hospital CD IOP program  Discharge Diagnoses:  Active Hospital Problems   Diagnosis Date Noted  . Ascites 12/06/2015  . Alcoholic hepatitis with ascites 12/09/2015  . Hepatic failure (HCC) 12/06/2015  . Coagulopathy (HCC) 12/06/2015  . Alcohol abuse 12/06/2015    Resolved Hospital Problems   Diagnosis Date Noted Date Resolved  No resolved problems to display.    Discharge Condition: stable  Diet recommendation: low sodium diet  Filed Weights   12/11/15 0615 12/12/15 0615 12/13/15 0637  Weight: 63.549 kg (140 lb 1.6 oz) 64.728 kg (142 lb 11.2 oz) 63.9 kg (140 lb 14 oz)    History of present illness:  Chief Complaint: Abdominal pain and distention.  HPI: Angela Villegas is a [age] y.o. female with medical history significant of alcoholism, ADHD presents to the ER because of increasing abdominal pain in the right upper quadrant with abdominal distention. Patient states she drinks Vodka every day for last 4 years and notices that over the last 2-3 weeks patient has been having increasing abdominal distention and increasing pain in the right upper quadrant over the last 1 week. Patient has been having diarrhea alternating with constipation and had tried over-the-counter laxatives. Patient had 2 episodes of nausea vomiting yesterday but no blood in it. In the ER patient was found to have started abdomen. CT abdomen and pelvis shows ascites with abnormalities in the liver concerning for focal areas of necrosis. LFTs are mildly elevated. Patient has been admitted for further management of ascites probably from developing  cirrhosis. Patient also hasn't had some cough. Denies any shortness of breath or chest pain. Patient's lab work show in addition to the elevated LFTs and mildly elevated INR leukocytosis. Patient before my exam became tachycardic and tremulous and I have to give 2 mg of IV Ativan for alcohol withdrawal.  ED Course: Was started on antibiotics after blood cultures were obtained.  Hospital Course:  Principal Problem:   Ascites Active Problems:   Hepatic failure (HCC)   Coagulopathy (HCC)   Alcohol abuse   Alcoholic hepatitis with ascites  Ascites/leukocytosis: ultrasound guided paracentesis on 5/26 with 4.8 L removed, with a total of 236 white blood cells. Blood cultures and ascites fluid cultures are negative, probably her leukocytosis is due to inflammatory liver reaction. epeat paracentesis on 6/1 with 1.7 liter fluids removed, fluid gram stain no organism, wbc 310 with 6% neutrophil. Patient reported feeling better after paracentesis S/p zosyn x2 dose and rocephin x 3 days initially on presentation She is discharged on propranolol, lasix/spironolactone  Alcoholic hepatitis/Coagulopathy (HCC) Her MELD score 14, her MAddery function is 40 which is quite high on admission LFTs has plateaued on 5/30,  ANA negative, anti-smooth muscle weak positive, Ceruloplasmin wnl and 24 copper pending, no deficiency of a1 antitrypsin, hepatitis panel negative Better, she did not need steroids treatment. Appreciate GI assistance.  Alcoholic abuse (HCC) completed her Ativan protocol. Social worker consulted, CD IOP info provided.  Hypokalemia: resolved.   Hyponatremia: from hypervolemia? +/- diuretics. Asymptomatic, repeat bmp at hospital discharge follow up  ADHD: Continue Adderall.reported she has been on this since she was 14.   Family Communication:patient and  her husband in room Disposition Plan/Barrier to D/C: home    Procedures:  Paracentesis on 5/26 and  6/1  Consultations:  GI  Discharge Exam: BP 108/69 mmHg  Pulse 90  Temp(Src) 99.4 F (37.4 C) (Oral)  Resp 18  Ht 5\' 1"  (1.549 m)  Wt 63.9 kg (140 lb 14 oz)  BMI 26.63 kg/m2  SpO2 98%  LMP 10/23/2015 (Approximate)  General: aaox3 Cardiovascular: RRR Respiratory: CTABL Ab: less distension, nontender, + bowel sounds Extremity: no edema  Discharge Instructions You were cared for by a hospitalist during your hospital stay. If you have any questions about your discharge medications or the care you received while you were in the hospital after you are discharged, you can call the unit and asked to speak with the hospitalist on call if the hospitalist that took care of you is not available. Once you are discharged, your primary care physician will handle any further medical issues. Please note that NO REFILLS for any discharge medications will be authorized once you are discharged, as it is imperative that you return to your primary care physician (or establish a relationship with a primary care physician if you do not have one) for your aftercare needs so that they can reassess your need for medications and monitor your lab values.      Discharge Instructions    Diet - low sodium heart healthy    Complete by:  As directed      Increase activity slowly    Complete by:  As directed             Medication List    STOP taking these medications        amLODipine 5 MG tablet  Commonly known as:  NORVASC     oxyCODONE-acetaminophen 5-325 MG tablet  Commonly known as:  PERCOCET     silver sulfADIAZINE 1 % cream  Commonly known as:  SILVADENE     traMADol 50 MG tablet  Commonly known as:  ULTRAM      TAKE these medications        amphetamine-dextroamphetamine 15 MG 24 hr capsule  Commonly known as:  ADDERALL XR  Take 15 mg by mouth every morning.     amphetamine-dextroamphetamine 10 MG tablet  Commonly known as:  ADDERALL  Take 10 mg by mouth every evening.      fluticasone 50 MCG/ACT nasal spray  Commonly known as:  FLONASE  Place 2 sprays into both nostrils daily.     furosemide 40 MG tablet  Commonly known as:  LASIX  Take 1 tablet (40 mg total) by mouth daily.     guaifenesin 100 MG/5ML syrup  Commonly known as:  ROBITUSSIN  Take 200 mg by mouth 3 (three) times daily as needed for cough.     ibuprofen 200 MG tablet  Commonly known as:  ADVIL,MOTRIN  Take 400 mg by mouth every 6 (six) hours as needed for headache or mild pain.     oxycodone 5 MG capsule  Commonly known as:  OXY-IR  Take 1 capsule (5 mg total) by mouth every 4 (four) hours as needed.     propranolol 10 MG tablet  Commonly known as:  INDERAL  Take 10 mg by mouth 2 (two) times daily.     spironolactone 100 MG tablet  Commonly known as:  ALDACTONE  Take 1 tablet (100 mg total) by mouth daily.       Allergies  Allergen Reactions  . Vicodin [Hydrocodone-Acetaminophen]  Itching and Nausea Only   Follow-up Information    Follow up with IF You want a new PCP.   Why:  Call number on back of your insurance ([employer] card) you will be given names of PCP in network for primiary care.      Follow up with [facility] CD IOP program On 12/12/2015.   Why:  Call and schedule inital appointment with Charmian Muff for CD IOP program.   Contact information:   [address] [place], [place] [address] [phone]      Follow up with Frederich Chick, MD In 1 week.   Specialty:  Family Medicine   Why:  hospital discharge follow up   Contact information:   [address] [place] [address] [phone]       Follow up with Florencia Reasons, MD In 2 weeks.   Specialty:  Gastroenterology   Why:  alcohol hepatitis/ascites   Contact information:   [address] [place] [address] [phone]        The results of significant diagnostics from this hospitalization (including imaging, microbiology, ancillary and laboratory) are  listed below for reference.    Significant Diagnostic Studies: Ct Abdomen Pelvis W Contrast  12/05/2015  CLINICAL DATA:  Abdominal distention for 2 weeks. EXAM: CT ABDOMEN AND PELVIS WITH CONTRAST TECHNIQUE: Multidetector CT imaging of the abdomen and pelvis was performed using the standard protocol following bolus administration of intravenous contrast. CONTRAST:  ISOVUE-300 IOPAMIDOL (ISOVUE-300) INJECTION 61% COMPARISON:  None. FINDINGS: Lower chest: Linear atelectasis in the right greater than left lower lobe. No pleural effusion. Liver: Markedly enlarged and heterogeneous. There is diffusely decreased hepatic density. More confluent subcapsular hypodensity in the anterior right anterior and superior anterior left lobes with mild associated volume loss. More confluent hypodensity in the caudate. Rounded fluid density structures adjacent the gallbladder fossa may be loculated fluid or more confluent hepatic hypodensity. There is blood flow in the portal veins. Hepatobiliary: Sludge in the gallbladder without calcified stone. No biliary dilatation. Pancreas: No ductal dilatation or inflammation. Spleen: Normal in size. Adrenal glands: No nodule. Kidneys: Symmetric renal enhancement.  No hydronephrosis. Stomach/Bowel: Stomach physiologically distended. There is mild small bowel thickening involving left upper quadrant bowel loops. More distal small bowel loops are decompressed. Small volume of stool throughout the colon without colonic wall thickening. The appendix is normal. Vascular/Lymphatic: No retroperitoneal adenopathy. Abdominal aorta is normal in caliber. Reproductive: Uterus and ovaries are normal for age. Bladder: Physiologically distended without wall thickening. Other: Large volume of intra-abdominal ascites.  No free air. Musculoskeletal: There are no acute or suspicious osseous abnormalities. IMPRESSION: 1. Abnormal appearance of the liver. There is hepatomegaly, heterogeneous parenchyma and  diffusely decreased density. More confluent regions of hypodensity in the anterior right and superior anterior left lobes with mild associated volume loss. Overall findings consistent with advanced liver disease, and possible hepatic failure. The more confluent regions suspicious for hepatocellular necrosis. 2. Large volume intra-abdominal ascites. These results were called by telephone at the time of interpretation on 12/05/2015 at 11:34 pm to Dr. Estell Harpin , who verbally acknowledged these results. Electronically Signed   By: Rubye Oaks M.D.   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 Request has been made for diagnostic and therapeutic paracentesis EXAM: ULTRASOUND GUIDED DIAGNOSTIC AND THERAPEUTIC PARACENTESIS MEDICATIONS: 1% lidocaine COMPLICATIONS: None immediate. PROCEDURE: Informed written consent was obtained  from the patient after a discussion of the risks, benefits and alternatives to treatment. A timeout was performed prior to the initiation of the procedure. Initial ultrasound scanning demonstrates a small amount of ascites within the left lower abdominal quadrant. The left lower abdomen was prepped and draped in the usual sterile fashion. 1% lidocaine was used for local anesthesia. Following this, a Safe-T-Centesis catheter was introduced. The paracentesis was performed. The catheter was removed and a dressing was applied. The patient tolerated the procedure well without immediate post procedural complication. FINDINGS: A total of approximately 1.7 L of amber fluid was removed. Samples were sent to the laboratory as requested by the clinical team. IMPRESSION: Successful ultrasound-guided paracentesis yielding 1.7 liters of peritoneal fluid. Read by: Barnetta Chapel, PA-C Electronically Signed   By: Corlis Leak M.D.   On: 12/12/2015 12:54   US Paracentesis  12/06/2015  INDICATION: Abdominal  distension, ascites, hepatic failure / alcoholic hepatitis/probable cirrhosis; request made for diagnostic and therapeutic paracentesis. EXAM: ULTRASOUND GUIDED DIAGNOSTIC AND THERAPEUTIC PARACENTESIS MEDICATIONS: None. COMPLICATIONS: None immediate. PROCEDURE: Informed written consent was obtained from the patient after a discussion of the risks, benefits and alternatives to treatment. A timeout was performed prior to the initiation of the procedure. Initial ultrasound scanning demonstrates a large amount of ascites within the left lower abdominal quadrant. The left lower abdomen was prepped and draped in the usual sterile fashion. 1% lidocaine was used for local anesthesia. Following this, a Yueh catheter was introduced. An ultrasound image was saved for documentation purposes. The paracentesis was performed. The catheter was removed and a dressing was applied. The patient tolerated the procedure well without immediate post procedural complication. FINDINGS: A total of approximately 4.8 liters of golden yellow fluid was removed. Samples were sent to the laboratory as requested by the clinical team. IMPRESSION: Successful ultrasound-guided diagnostic and therapeutic paracentesis yielding 4.8 liters of peritoneal fluid. Read by: Jeananne Rama, PA-C Electronically Signed   By: Simonne Come M.D.   On: 12/06/2015 15:21   Dg Chest Port 1 View  12/06/2015  CLINICAL DATA:  Cough and shortness of breath. Abdominal distension. EXAM: PORTABLE CHEST 1 VIEW COMPARISON:  None. FINDINGS: Lung volumes are low. The cardiomediastinal contours are normal. Pulmonary vasculature is normal. No consolidation, pleural effusion, or pneumothorax. No acute osseous abnormalities are seen. IMPRESSION: Low lung volumes without acute process. Electronically Signed   By: Rubye Oaks M.D.   On: 12/06/2015 04:53    Microbiology: Recent Results (from the past 240 hour(s))  Urine culture     Status: Abnormal   Collection Time: 12/05/15   9:00 PM  Result Value Ref Range Status   Specimen Description URINE, CLEAN CATCH  Final   Special Requests NONE  Final   Culture MULTIPLE SPECIES PRESENT, SUGGEST RECOLLECTION (A)  Final   Report Status 12/07/2015 FINAL  Final  Culture, blood (routine x 2)     Status: Abnormal   Collection Time: 12/06/15 12:30 AM  Result Value Ref Range Status   Specimen Description BLOOD LEFT ANTECUBITAL  Final   Special Requests BOTTLES DRAWN AEROBIC AND ANAEROBIC  Final   Culture  Setup Time   Final    GRAM POSITIVE RODS ANAEROBIC BOTTLE ONLY CRITICAL RESULT CALLED TO, READ BACK BY AND VERIFIED WITH: MICK GLOGOVAC,PHARMD @ 1610 12/11/15 MKELLY    Culture (A)  Final    DIPHTHEROIDS(CORYNEBACTERIUM SPECIES) Standardized susceptibility testing for this organism is not available. Performed at [facility]    Report Status 12/13/2015  FINAL  Final  Blood Culture ID Panel (Reflexed)     Status: None   Collection Time: 12/06/15 12:30 AM  Result Value Ref Range Status   Enterococcus species NOT DETECTED NOT DETECTED Final   Vancomycin resistance NOT DETECTED NOT DETECTED Final   Listeria monocytogenes NOT DETECTED NOT DETECTED Final   Staphylococcus species NOT DETECTED NOT DETECTED Final   Staphylococcus aureus NOT DETECTED NOT DETECTED Final   Methicillin resistance NOT DETECTED NOT DETECTED Final   Streptococcus species NOT DETECTED NOT DETECTED Final   Streptococcus agalactiae NOT DETECTED NOT DETECTED Final   Streptococcus pneumoniae NOT DETECTED NOT DETECTED Final   Streptococcus pyogenes NOT DETECTED NOT DETECTED Final   Acinetobacter baumannii NOT DETECTED NOT DETECTED Final   Enterobacteriaceae species NOT DETECTED NOT DETECTED Final   Enterobacter cloacae complex NOT DETECTED NOT DETECTED Final   Escherichia coli NOT DETECTED NOT DETECTED Final   Klebsiella oxytoca NOT DETECTED NOT DETECTED Final   Klebsiella pneumoniae NOT DETECTED NOT DETECTED Final   Proteus species NOT  DETECTED NOT DETECTED Final   Serratia marcescens NOT DETECTED NOT DETECTED Final   Carbapenem resistance NOT DETECTED NOT DETECTED Final   Haemophilus influenzae NOT DETECTED NOT DETECTED Final   Neisseria meningitidis NOT DETECTED NOT DETECTED Final   Pseudomonas aeruginosa NOT DETECTED NOT DETECTED Final   Candida albicans NOT DETECTED NOT DETECTED Final   Candida glabrata NOT DETECTED NOT DETECTED Final   Candida krusei NOT DETECTED NOT DETECTED Final   Candida parapsilosis NOT DETECTED NOT DETECTED Final   Candida tropicalis NOT DETECTED NOT DETECTED Final    Comment: Performed at [facility]  MRSA PCR Screening     Status: Abnormal   Collection Time: 12/06/15  4:06 AM  Result Value Ref Range Status   MRSA by PCR POSITIVE (A) NEGATIVE Final    Comment:        The GeneXpert MRSA Assay (FDA approved for NASAL specimens only), is one component of a comprehensive MRSA colonization surveillance program. It is not intended to diagnose MRSA infection nor to guide or monitor treatment for MRSA infections. RESULT CALLED TO, READ BACK BY AND VERIFIED WITH: Q MBEMENA RN @ [phone] ON 12/06/15 BY C DAVIS   Culture, blood (routine x 2)     Status: None   Collection Time: 12/06/15  6:22 AM  Result Value Ref Range Status   Specimen Description BLOOD LEFT ARM  Final   Special Requests BOTTLES DRAWN AEROBIC ONLY 5CC  Final   Culture   Final    NO GROWTH 5 DAYS Performed at [facility]    Report Status 12/11/2015 FINAL  Final  Culture, body fluid-bottle     Status: None   Collection Time: 12/06/15  2:20 PM  Result Value Ref Range Status   Specimen Description FLUID PERITONEAL  Final   Special Requests BOTTLES DRAWN AEROBIC AND ANAEROBIC 10CC  Final   Culture   Final    NO GROWTH 5 DAYS Performed at [facility]    Report Status 12/11/2015 FINAL  Final  Gram stain     Status: None   Collection Time: 12/06/15  2:20 PM  Result Value Ref Range Status   Specimen  Description FLUID PERITONEAL  Final   Special Requests NONE  Final   Gram Stain FEW MONONUCLEAR CELLS NO ORGANISMS SEEN   Final   Report Status 12/06/2015 FINAL  Final  Culture, body fluid-bottle     Status: None (Preliminary result)  Collection Time: 12/12/15 11:29 AM  Result Value Ref Range Status   Specimen Description FLUID PERITONEAL  Final   Special Requests NONE  Final   Culture   Final    NO GROWTH 1 DAY Performed at [facility]    Report Status PENDING  Incomplete  Gram stain     Status: None   Collection Time: 12/12/15 11:29 AM  Result Value Ref Range Status   Specimen Description FLUID PERITONEAL  Final   Special Requests NONE  Final   Gram Stain   Final    FEW WBC PRESENT,BOTH PMN AND MONONUCLEAR NO ORGANISMS SEEN    Report Status 12/12/2015 FINAL  Final     Labs: Basic Metabolic Panel:  Recent Labs Lab 12/09/15 0322 12/10/15 0351 12/11/15 0345 12/12/15 0353 12/13/15 0345  NA 132* 128* 127* 128* 127*  K 4.8 4.5 4.0 3.6 3.6  CL 98* 95* 93* 94* 94*  CO2 GLUCOSE 115* 104* 129* 128* 124*  BUN 5* <5* <5* <5* <5*  CREATININE 0.43* 0.51 0.50 0.41* 0.39*  CALCIUM 7.9* 8.1* 8.2* 8.3* 8.1*  MG  --   --   --   --  1.8   Liver Function Tests:  Recent Labs Lab 12/09/15 0322 12/10/15 0351 12/11/15 0345 12/12/15 0353 12/13/15 0345  AST 133* 153* 141* 151* 126*  ALT 13* ALKPHOS 204* 184* 184* 169* 158*  BILITOT 3.8* 3.9* 3.7* 3.6* 3.5*  PROT 7.2 7.1 7.3 7.2 7.2  ALBUMIN 3.4* 3.1* 3.3* 3.4* 3.2*   No results for input(s): LIPASE, AMYLASE in the last 168 hours. No results for input(s): AMMONIA in the last 168 hours. CBC:  Recent Labs Lab 12/07/15 0904 12/09/15 0322 12/12/15 0353  WBC 22.9* 22.8* 21.1*  HGB 9.8* 10.5* 10.0*  HCT 29.8* 32.6* 30.1*  MCV 81.6 83.6 81.4  PLT 274 286 278   Cardiac Enzymes: No results for input(s): CKTOTAL, CKMB, CKMBINDEX, TROPONINI in the last 168 hours. BNP: BNP (last 3  results) No results for input(s): BNP in the last 8760 hours.  ProBNP (last 3 results) No results for input(s): PROBNP in the last 8760 hours.  CBG: No results for input(s): GLUCAP in the last 168 hours.     SignedAlbertine Grates MD, PhD  [facility] 12/13/2015, 8:00 PM

## 2015-12-13 NOTE — Progress Notes (Signed)
Discharge instructions reviewed with patient. Patient verbalized understanding. Patient to be discharged via wheelchair.

## 2015-12-16 LAB — PH, BODY FLUID: pH, Body Fluid: 7.9

## 2015-12-17 LAB — CULTURE, BODY FLUID-BOTTLE: CULTURE: NO GROWTH

## 2015-12-17 LAB — CULTURE, BODY FLUID W GRAM STAIN -BOTTLE

## 2015-12-23 LAB — COPPER, URINE, 24 HOUR
COPPER, URINE (24 HR): 16 ug/L (ref 2–30)
CREATININE, URINE MG/DAY-CUURI: 0.7 g/(24.h) (ref 0.63–2.50)

## 2017-07-26 ENCOUNTER — Emergency Department (HOSPITAL_BASED_OUTPATIENT_CLINIC_OR_DEPARTMENT_OTHER): Payer: BC Managed Care – PPO

## 2017-07-26 ENCOUNTER — Encounter (HOSPITAL_BASED_OUTPATIENT_CLINIC_OR_DEPARTMENT_OTHER): Payer: Self-pay | Admitting: Emergency Medicine

## 2017-07-26 ENCOUNTER — Inpatient Hospital Stay (HOSPITAL_BASED_OUTPATIENT_CLINIC_OR_DEPARTMENT_OTHER)
Admission: EM | Admit: 2017-07-26 | Discharge: 2017-08-01 | DRG: 982 | Disposition: A | Discharge status: Home / Self Care | Payer: BC Managed Care – PPO | Attending: Internal Medicine | Admitting: Internal Medicine

## 2017-07-26 ENCOUNTER — Other Ambulatory Visit: Payer: Self-pay

## 2017-07-26 DIAGNOSIS — Y92230 Patient room in hospital as the place of occurrence of the external cause: Secondary | ICD-10-CM | POA: Diagnosis not present

## 2017-07-26 DIAGNOSIS — M6008 Infective myositis, other site: Secondary | ICD-10-CM | POA: Diagnosis present

## 2017-07-26 DIAGNOSIS — F1010 Alcohol abuse, uncomplicated: Secondary | ICD-10-CM

## 2017-07-26 DIAGNOSIS — L03221 Cellulitis of neck: Secondary | ICD-10-CM | POA: Diagnosis present

## 2017-07-26 DIAGNOSIS — I10 Essential (primary) hypertension: Secondary | ICD-10-CM | POA: Diagnosis present

## 2017-07-26 DIAGNOSIS — E873 Alkalosis: Secondary | ICD-10-CM | POA: Diagnosis present

## 2017-07-26 DIAGNOSIS — Y900 Blood alcohol level of less than 20 mg/100 ml: Secondary | ICD-10-CM | POA: Diagnosis present

## 2017-07-26 DIAGNOSIS — R740 Nonspecific elevation of levels of transaminase and lactic acid dehydrogenase [LDH]: Secondary | ICD-10-CM

## 2017-07-26 DIAGNOSIS — E669 Obesity, unspecified: Secondary | ICD-10-CM | POA: Diagnosis present

## 2017-07-26 DIAGNOSIS — J45909 Unspecified asthma, uncomplicated: Secondary | ICD-10-CM | POA: Diagnosis present

## 2017-07-26 DIAGNOSIS — E871 Hypo-osmolality and hyponatremia: Secondary | ICD-10-CM

## 2017-07-26 DIAGNOSIS — Z6830 Body mass index (BMI) 30.0-30.9, adult: Secondary | ICD-10-CM | POA: Diagnosis not present

## 2017-07-26 DIAGNOSIS — S15301A Unspecified injury of right internal jugular vein, initial encounter: Secondary | ICD-10-CM | POA: Diagnosis present

## 2017-07-26 DIAGNOSIS — L0201 Cutaneous abscess of face: Secondary | ICD-10-CM

## 2017-07-26 DIAGNOSIS — Y838 Other surgical procedures as the cause of abnormal reaction of the patient, or of later complication, without mention of misadventure at the time of the procedure: Secondary | ICD-10-CM | POA: Diagnosis not present

## 2017-07-26 DIAGNOSIS — D62 Acute posthemorrhagic anemia: Secondary | ICD-10-CM | POA: Diagnosis not present

## 2017-07-26 DIAGNOSIS — F10239 Alcohol dependence with withdrawal, unspecified: Secondary | ICD-10-CM | POA: Diagnosis present

## 2017-07-26 DIAGNOSIS — Z794 Long term (current) use of insulin: Secondary | ICD-10-CM | POA: Diagnosis not present

## 2017-07-26 DIAGNOSIS — R7401 Elevation of levels of liver transaminase levels: Secondary | ICD-10-CM

## 2017-07-26 DIAGNOSIS — D689 Coagulation defect, unspecified: Secondary | ICD-10-CM | POA: Diagnosis present

## 2017-07-26 DIAGNOSIS — I9762 Postprocedural hemorrhage of a circulatory system organ or structure following other procedure: Secondary | ICD-10-CM | POA: Diagnosis not present

## 2017-07-26 DIAGNOSIS — E1165 Type 2 diabetes mellitus with hyperglycemia: Secondary | ICD-10-CM | POA: Diagnosis present

## 2017-07-26 DIAGNOSIS — E119 Type 2 diabetes mellitus without complications: Secondary | ICD-10-CM

## 2017-07-26 DIAGNOSIS — F988 Other specified behavioral and emotional disorders with onset usually occurring in childhood and adolescence: Secondary | ICD-10-CM | POA: Diagnosis present

## 2017-07-26 DIAGNOSIS — L0211 Cutaneous abscess of neck: Secondary | ICD-10-CM | POA: Diagnosis present

## 2017-07-26 DIAGNOSIS — F17200 Nicotine dependence, unspecified, uncomplicated: Secondary | ICD-10-CM | POA: Diagnosis present

## 2017-07-26 DIAGNOSIS — E876 Hypokalemia: Secondary | ICD-10-CM | POA: Diagnosis present

## 2017-07-26 DIAGNOSIS — K7030 Alcoholic cirrhosis of liver without ascites: Secondary | ICD-10-CM | POA: Diagnosis present

## 2017-07-26 DIAGNOSIS — F909 Attention-deficit hyperactivity disorder, unspecified type: Secondary | ICD-10-CM | POA: Diagnosis present

## 2017-07-26 DIAGNOSIS — E65 Localized adiposity: Secondary | ICD-10-CM | POA: Diagnosis present

## 2017-07-26 DIAGNOSIS — D696 Thrombocytopenia, unspecified: Secondary | ICD-10-CM | POA: Diagnosis present

## 2017-07-26 DIAGNOSIS — M60009 Infective myositis, unspecified site: Secondary | ICD-10-CM | POA: Diagnosis present

## 2017-07-26 DIAGNOSIS — R739 Hyperglycemia, unspecified: Secondary | ICD-10-CM

## 2017-07-26 HISTORY — DX: Alcohol abuse, uncomplicated: F10.10

## 2017-07-26 HISTORY — DX: Liver disease, unspecified: K76.9

## 2017-07-26 LAB — COMPREHENSIVE METABOLIC PANEL
ALBUMIN: 2.8 g/dL — AB (ref 3.5–5.0)
ALT: 12 U/L — ABNORMAL LOW (ref 14–54)
ANION GAP: 18 — AB (ref 5–15)
AST: 96 U/L — AB (ref 15–41)
Alkaline Phosphatase: 243 U/L — ABNORMAL HIGH (ref 38–126)
BILIRUBIN TOTAL: 5.4 mg/dL — AB (ref 0.3–1.2)
CHLORIDE: 81 mmol/L — AB (ref 101–111)
CO2: 22 mmol/L (ref 22–32)
Calcium: 7.6 mg/dL — ABNORMAL LOW (ref 8.9–10.3)
Creatinine, Ser: 0.42 mg/dL — ABNORMAL LOW (ref 0.44–1.00)
GFR calc Af Amer: >60 mL/min (ref >60)
GFR calc non Af Amer: >60 mL/min (ref >60)
GLUCOSE: 410 mg/dL — AB (ref 65–99)
Potassium: 3.4 mmol/L — ABNORMAL LOW (ref 3.5–5.1)
SODIUM: 121 mmol/L — AB (ref 135–145)
TOTAL PROTEIN: 8.7 g/dL — AB (ref 6.5–8.1)

## 2017-07-26 LAB — URINALYSIS, ROUTINE W REFLEX MICROSCOPIC
Glucose, UA: >=500 mg/dL — AB
Ketones, ur: 15 mg/dL — AB
Leukocytes, UA: NEGATIVE
Nitrite: NEGATIVE
PH: 8.5 — AB (ref 5.0–8.0)
Protein, ur: 100 mg/dL — AB

## 2017-07-26 LAB — CBC WITH DIFFERENTIAL/PLATELET
BASOS PCT: 0 %
Basophils Absolute: 0 10*3/uL (ref 0.0–0.1)
EOS PCT: 0 %
Eosinophils Absolute: 0 10*3/uL (ref 0.0–0.7)
HEMATOCRIT: 33.6 % — AB (ref 36.0–46.0)
Hemoglobin: 12 g/dL (ref 12.0–15.0)
LYMPHS ABS: 1 10*3/uL (ref 0.7–4.0)
Lymphocytes Relative: 8 %
MCH: 28.5 pg (ref 26.0–34.0)
MCHC: 35.7 g/dL (ref 30.0–36.0)
MCV: 79.8 fL (ref 78.0–100.0)
MONOS PCT: 9 %
Monocytes Absolute: 1.1 10*3/uL — ABNORMAL HIGH (ref 0.1–1.0)
NEUTROS ABS: 10.4 10*3/uL — AB (ref 1.7–7.7)
Neutrophils Relative %: 83 %
Platelets: 101 10*3/uL — ABNORMAL LOW (ref 150–400)
RBC: 4.21 MIL/uL (ref 3.87–5.11)
RDW: 14.9 % (ref 11.5–15.5)
WBC: 12.5 10*3/uL — ABNORMAL HIGH (ref 4.0–10.5)

## 2017-07-26 LAB — I-STAT VENOUS BLOOD GAS, ED
ACID-BASE EXCESS: 3 mmol/L — AB (ref 0.0–2.0)
BICARBONATE: 25.8 mmol/L (ref 20.0–28.0)
O2 Saturation: 79 %
PH VEN: 7.504 — AB (ref 7.250–7.430)
TCO2: 27 mmol/L (ref 22–32)
pCO2, Ven: 32.9 mmHg — ABNORMAL LOW (ref 44.0–60.0)
pO2, Ven: 39 mmHg (ref 32.0–45.0)

## 2017-07-26 LAB — URINALYSIS, MICROSCOPIC (REFLEX)

## 2017-07-26 LAB — PREGNANCY, URINE: Preg Test, Ur: NEGATIVE

## 2017-07-26 MED ORDER — SODIUM CHLORIDE 0.9 % IV SOLN
INTRAVENOUS | Status: DC
  Filled 2017-07-26: qty 1

## 2017-07-26 MED ORDER — VANCOMYCIN HCL IN DEXTROSE 1-5 GM/200ML-% IV SOLN
1000.0000 mg | Freq: Two times a day (BID) | INTRAVENOUS | Status: DC
  Administered 2017-07-26 – 2017-07-29 (×6): 1000 mg via INTRAVENOUS
  Filled 2017-07-26 (×6): qty 200

## 2017-07-26 MED ORDER — PIPERACILLIN-TAZOBACTAM 3.375 G IVPB 30 MIN
3.3750 g | Freq: Once | INTRAVENOUS | Status: AC
  Administered 2017-07-26: 3.375 g via INTRAVENOUS
  Filled 2017-07-26 (×2): qty 50

## 2017-07-26 MED ORDER — SODIUM CHLORIDE 0.9 % IV SOLN
Freq: Once | INTRAVENOUS | Status: AC
  Administered 2017-07-26: 22:00:00 via INTRAVENOUS

## 2017-07-26 MED ORDER — PIPERACILLIN-TAZOBACTAM 3.375 G IVPB
3.3750 g | Freq: Three times a day (TID) | INTRAVENOUS | Status: DC
  Administered 2017-07-27 – 2017-07-29 (×6): 3.375 g via INTRAVENOUS
  Filled 2017-07-26 (×7): qty 50

## 2017-07-26 MED ORDER — SODIUM CHLORIDE 0.9 % IV SOLN: Freq: Once | INTRAVENOUS | Status: DC

## 2017-07-26 MED ORDER — IOPAMIDOL (ISOVUE-300) INJECTION 61%
100.0000 mL | Freq: Once | INTRAVENOUS | Status: AC | PRN
  Administered 2017-07-26: 75 mL via INTRAVENOUS

## 2017-07-26 MED ORDER — ONDANSETRON HCL 4 MG/2ML IJ SOLN
4.0000 mg | Freq: Once | INTRAMUSCULAR | Status: AC
  Administered 2017-07-26 – 2017-07-27 (×2): 4 mg via INTRAVENOUS
  Filled 2017-07-26: qty 2

## 2017-07-26 MED ORDER — CLINDAMYCIN PHOSPHATE 600 MG/50ML IV SOLN: 600.0000 mg | Freq: Once | INTRAVENOUS | Status: DC

## 2017-07-26 MED ORDER — MORPHINE SULFATE (PF) 4 MG/ML IV SOLN
4.0000 mg | Freq: Once | INTRAVENOUS | Status: AC
  Administered 2017-07-26: 4 mg via INTRAVENOUS
  Filled 2017-07-26: qty 1

## 2017-07-26 MED ORDER — POTASSIUM CHLORIDE 10 MEQ/100ML IV SOLN
10.0000 meq | INTRAVENOUS | Status: DC
  Filled 2017-07-26: qty 100

## 2017-07-26 MED ORDER — SODIUM CHLORIDE 0.9 % IV BOLUS (SEPSIS)
1000.0000 mL | Freq: Once | INTRAVENOUS | Status: AC
  Administered 2017-07-26: 1000 mL via INTRAVENOUS

## 2017-07-26 MED ORDER — HYDROMORPHONE HCL 1 MG/ML IJ SOLN
1.0000 mg | Freq: Once | INTRAMUSCULAR | Status: AC
  Administered 2017-07-26: 1 mg via INTRAVENOUS
  Filled 2017-07-26: qty 1

## 2017-07-26 MED ORDER — POTASSIUM CHLORIDE 10 MEQ/100ML IV SOLN
10.0000 meq | Freq: Once | INTRAVENOUS | Status: AC
  Administered 2017-07-26: 10 meq via INTRAVENOUS
  Filled 2017-07-26: qty 100

## 2017-07-26 MED ORDER — POTASSIUM CHLORIDE 10 MEQ/100ML IV SOLN
10.0000 meq | Freq: Once | INTRAVENOUS | Status: AC
  Administered 2017-07-26: 10 meq via INTRAVENOUS

## 2017-07-26 NOTE — ED Notes (Signed)
Admits to drinking a fifth of alcohol a day. Last drink was 1800 tonight. Denies drug use . ED MD informed

## 2017-07-26 NOTE — ED Provider Notes (Signed)
[facility] EMERGENCY DEPARTMENT Provider Note   CSN: [id number] Arrival date & time: 07/26/17  1807     History   Chief Complaint Chief Complaint  Patient presents with  . Abscess    HPI Angela Villegas is a 27 y.o. female who presents with right sided facial/neck swelling. PMH significant for alcohol abuse and dependence, hx of acute liver failure, anxiety/depression. She has been sick for approximately a month per her husband at bedside. She first saw her PCP due to a sinus infection and cough and was diagnosed with a sinus infection/bronchitis. She was put on a course of Levaquin and finished this three days ago. Over the past several weeks she has also had a gradually worsening area of swelling and pain over the right side of her face and neck. She thinks she has eczema and she compulsively scratches behind her ears. She saw her PCP again today for the swelling but were told to come to the ED. Her congestion and cough have improved but she still has fever, chills, generalized weakness, N/V. She continues to drink alcohol daily and heavily. Last drink was at [facility].  HPI  Past Medical History:  Diagnosis Date  . ADD (attention deficit disorder)   . Asthma   . Hypertension     Patient Active Problem List   Diagnosis Date Noted  . Alcoholic hepatitis with ascites 12/09/2015  . Hepatic failure (HCC) 12/06/2015  . Ascites 12/06/2015  . Alcoholic hepatitis 12/06/2015  . Coagulopathy (HCC) 12/06/2015  . Alcohol abuse 12/06/2015  . Acute alcohol intoxication (HCC)     History reviewed. No pertinent surgical history.  OB History    No data available       Home Medications    Prior to Admission medications   Medication Sig Start Date End Date Taking? Authorizing Provider  amphetamine-dextroamphetamine (ADDERALL XR) 15 MG 24 hr capsule Take 15 mg by mouth every morning.    [provider]  amphetamine-dextroamphetamine (ADDERALL) 10 MG tablet Take  10 mg by mouth every evening.  12/21/14   [provider]  fluticasone (FLONASE) 50 MCG/ACT nasal spray Place 2 sprays into both nostrils daily.    [provider]  furosemide (LASIX) 40 MG tablet Take 1 tablet (40 mg total) by mouth daily. 12/13/15   Albertine Grates, MD  guaifenesin (ROBITUSSIN) 100 MG/5ML syrup Take 200 mg by mouth 3 (three) times daily as needed for cough.    [provider]  ibuprofen (ADVIL,MOTRIN) 200 MG tablet Take 400 mg by mouth every 6 (six) hours as needed for headache or mild pain.    [provider]  oxycodone (OXY-IR) 5 MG capsule Take 1 capsule (5 mg total) by mouth every 4 (four) hours as needed. 12/13/15   Albertine Grates, MD  propranolol (INDERAL) 10 MG tablet Take 10 mg by mouth 2 (two) times daily.    [provider]  spironolactone (ALDACTONE) 100 MG tablet Take 1 tablet (100 mg total) by mouth daily. 12/13/15   Albertine Grates, MD    Family History Family History  Problem Relation Age of Onset  . GER disease Mother     Social History Social History   Tobacco Use  . Smoking status: Current Every Day Smoker  . Smokeless tobacco: Never Used  Substance Use Topics  . Alcohol use: Yes  . Drug use: No     Allergies   Vicodin [hydrocodone-acetaminophen]   Review of Systems Review of Systems  Constitutional: Positive for  chills, fatigue and fever.  HENT: Positive for congestion (resolved) and facial swelling.   Respiratory: Positive for cough (resolved). Negative for shortness of breath.   Gastrointestinal: Positive for nausea and vomiting.  All other systems reviewed and are negative.    Physical Exam Updated Vital Signs BP (!) 150/103 (BP Location: Left Arm)   Pulse 93   Temp 98.3 F (36.8 C) (Oral)   Resp 20   Ht 5\' 1"  (1.549 m)   Wt 72.1 kg (159 lb)   LMP 07/20/2017   SpO2 100%   BMI 30.04 kg/m   Physical Exam  Constitutional: She is oriented to person, place, and time. She appears well-developed and  well-nourished. No distress.  HENT:  Head: Normocephalic and atraumatic.  Large area of swelling and induration extending from posterior right ear to right lower jaw. Associated redness and warmth. There are excoriations and bleeding behind bilateral ears. See photos for details  Eyes: Conjunctivae are normal. Pupils are equal, round, and reactive to light. Right eye exhibits no discharge. Left eye exhibits no discharge. No scleral icterus.  Neck: Normal range of motion.  Cardiovascular: Normal rate and regular rhythm. Exam reveals no gallop and no friction rub.  No murmur heard. Pulmonary/Chest: Effort normal and breath sounds normal. No stridor. No respiratory distress. She has no wheezes. She has no rales. She exhibits no tenderness.  Abdominal: She exhibits no distension.  Neurological: She is alert and oriented to person, place, and time.  Skin: Skin is warm and dry.  Psychiatric: She has a normal mood and affect. Her behavior is normal.  Nursing note and vitals reviewed.           ED Treatments / Results  Labs (all labs ordered are listed, but only abnormal results are displayed) Labs Reviewed  COMPREHENSIVE METABOLIC PANEL - Abnormal; Notable for the following components:      Result Value   Sodium 121 (*)    Potassium 3.4 (*)    Chloride 81 (*)    Glucose, Bld 410 (*)    BUN <5 (*)    Creatinine, Ser 0.42 (*)    Calcium 7.6 (*)    Total Protein 8.7 (*)    Albumin 2.8 (*)    AST 96 (*)    ALT 12 (*)    Alkaline Phosphatase 243 (*)    Total Bilirubin 5.4 (*)    Anion gap 18 (*)    All other components within normal limits  CBC WITH DIFFERENTIAL/PLATELET - Abnormal; Notable for the following components:   WBC 12.5 (*)    HCT 33.6 (*)    Platelets 101 (*)    Neutro Abs 10.4 (*)    Monocytes Absolute 1.1 (*)    All other components within normal limits  URINALYSIS, ROUTINE W REFLEX MICROSCOPIC - Abnormal; Notable for the following components:   Specific  Gravity, Urine <1.005 (*)    pH 8.5 (*)    Glucose, UA >=500 (*)    Hgb urine dipstick LARGE (*)    Bilirubin Urine MODERATE (*)    Ketones, ur 15 (*)    Protein, ur 100 (*)    All other components within normal limits  URINALYSIS, MICROSCOPIC (REFLEX) - Abnormal; Notable for the following components:   Bacteria, UA FEW (*)    Squamous Epithelial / LPF 0-5 (*)    All other components within normal limits  PREGNANCY, URINE  BLOOD GAS, VENOUS    EKG  EKG Interpretation None  Radiology Ct Soft Tissue Neck W Contrast  Result Date: 07/26/2017 CLINICAL DATA:  27 y/o F; large boil on right-sided neck just below mandible for 2 weeks with fever, nausea, and vomiting. EXAM: CT NECK WITH CONTRAST TECHNIQUE: Multidetector CT imaging of the neck was performed using the standard protocol following the bolus administration of intravenous contrast. CONTRAST:  75mL ISOVUE-300 IOPAMIDOL (ISOVUE-300) INJECTION 61% COMPARISON:  None. FINDINGS: Pharynx and larynx: Normal. No mass or swelling. Salivary glands: Mild edema of the parotid gland. Other salivary glands are normal. Thyroid: Normal. Lymph nodes: Right anterior and posterior cervical chain, right suboccipital, and right submandibular lymphadenopathy. No lymph node necrosis. No enlarged left-sided cervical lymph nodes. 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 Limited intracranial: Negative. Visualized orbits: Negative. Mastoids and visualized paranasal sinuses: Clear. Skeleton: No acute or aggressive process. Upper chest: Negative. Other: Massive swelling of the right proximal sternocleidomastoid muscle with multiloculated areas of fluid attenuation spanning 6.2 x 4.7 x 5.3 cm (volume = 81 cm^3). Extensive surrounding inflammation throughout the subcutaneous fat of the right face, right anterior neck, extending across the midline below the chin. Edema in  right masticator space, right parotid space, right parapharyngeal space, and right carotid space. Thickening of right platysma muscle and right superficial parotid fascia. IMPRESSION: 1. 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 Total volume approximately 81 cc. 2. Extensive inflammation throughout right masticator space, right parotid space, right parapharyngeal space, right carotid space, and throughout subcutaneous fat of right face, anterior neck, and chin. 3. 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 4. Right cervical, submandibular, and suboccipital lymphadenopathy, likely reactive. No lymph node necrosis. Electronically Signed   By: Mitzi Hansen M.D.   On: 07/26/2017 20:02    Procedures Procedures (including critical care time)  CRITICAL CARE Performed by: Bethel Born   Total critical care time: 40 minutes  Critical care time was exclusive of separately billable procedures and treating other patients.  Critical care was necessary to treat or prevent imminent or life-threatening deterioration.  Critical care was time spent personally by me on the following activities: development of treatment plan with patient and/or surrogate as well as nursing, discussions with consultants, evaluation of patient's response to treatment, examination of patient, obtaining history from patient or surrogate, ordering and performing treatments and interventions, ordering and review of laboratory studies, ordering and review of radiographic studies, pulse oximetry and re-evaluation of patient's condition.   Medications Ordered in ED Medications  vancomycin (VANCOCIN) IVPB 1000 mg/200 mL premix (not administered)  piperacillin-tazobactam (ZOSYN) IVPB 3.375 g (3.375 g Intravenous New Bag/Given 07/26/17 2133)  piperacillin-tazobactam (ZOSYN) IVPB 3.375 g (not  administered)  potassium chloride 10 mEq in 100 mL IVPB (not administered)  potassium chloride 10 mEq in 100 mL IVPB (10 mEq Intravenous New Bag/Given 07/26/17 2138)  0.9 %  sodium chloride infusion (not administered)  sodium chloride 0.9 % bolus 1,000 mL (0 mLs Intravenous Stopped 07/26/17 2116)  morphine 4 MG/ML injection 4 mg (4 mg Intravenous Given 07/26/17 1932)  ondansetron (ZOFRAN) injection 4 mg (4 mg Intravenous Given 07/26/17 1932)  iopamidol (ISOVUE-300) 61 % injection 100 mL (75 mLs Intravenous Contrast Given 07/26/17 1936)  morphine 4 MG/ML injection 4 mg (4 mg Intravenous Given 07/26/17 2123)  0.9 %  sodium chloride infusion ( Intravenous New Bag/Given 07/26/17 2133)  Initial Impression / Assessment and Plan / ED Course  I have reviewed the triage vital signs and the nursing notes.  Pertinent labs & imaging results that were available during my care of the patient were reviewed by me and considered in my medical decision making (see chart for details).  27 year old female who presents with large facial abscess likely from scratching. Vitals are normal. Abscess is large and will need surgical drainage. Labs are remarkable for multiple derangements. Blood glucose is 410, anion gap is 18, and she has ketones in the urine, however, pH is 7.5. Liver enzymes are still elevated. CBC is remarkable for leukocytosis of 12.5. CT confirms large loculated abscess in the facial muscles. Antibiotics, fluids, pain medicine given. I discussed with the patient and her husband regarding concerning findings. They are agreeable to admission. I spoke with Dr. Jenne PaneBates with ENT who would [facility]. Dr. Mikeal HawthorneGarba with [facility] will admit.   Final Clinical Impressions(s) / ED Diagnoses   Final diagnoses:  Facial abscess  Hyperglycemia  Hypokalemia  Elevated transaminase level  Alcohol abuse    ED Discharge Orders    None       Bethel BornGekas, Emerson Barretto Marie, PA-C 07/26/17 2148    Bethel BornGekas, Rahmon Heigl  Marie, PA-C 07/26/17 2152

## 2017-07-26 NOTE — ED Notes (Signed)
Given diet coke  

## 2017-07-26 NOTE — ED Notes (Signed)
Called [facility] ([facility]) and was advised that hospitalist had not put in request.

## 2017-07-26 NOTE — ED Notes (Signed)
Family at bedside. 

## 2017-07-26 NOTE — ED Notes (Signed)
Per rachel at [facility] insulin drip and kcl runs can run together

## 2017-07-26 NOTE — Plan of Care (Signed)
Patient stressed 27 year old female with history of alcohol abuse of multiple medical problems including previous acute hepatic failure from alcoholism. She is being admitted from [facility] with extensive facial cellulitis which has failed outpatient treatment with Levaquin. She's been on that for at least 3 days. She suspected to also have alcohol intoxication with danger for alcohol withdrawal.

## 2017-07-26 NOTE — ED Triage Notes (Signed)
Patient states that she has had an large boil to her right neck for the last 2 weeks. The patient reports that it is getting worse  - fevers, N/V

## 2017-07-26 NOTE — Progress Notes (Signed)
Pharmacy Antibiotic Note  Angela Villegas is a 27 y.o. female admitted on 07/26/2017 with cellulitis. Pharmacy has been consulted for Vancomycin and Zosyn dosing. Pt is afebrile. Elevated WBC 12.5. Scr WNL.  Plan: Vancomycin 1000 mg IV every 12 hours.  Goal trough 10-15 mcg/mL. Zosyn 3.375g IV x1 (30 minutes) Zosyn 3.375g IV q8h (4 hour infusion) F/u renal fxn, trough @ SS, clinical resolution  Height: 5\' 1"  (154.9 cm) Weight: 159 lb (72.1 kg) IBW/kg (Calculated) : 47.8  Temp (24hrs), Avg:99.1 F (37.3 C), Min:98.3 F (36.8 C), Max:99.9 F (37.7 C)  Recent Labs  Lab 07/26/17 1920  WBC 12.5*  CREATININE 0.42*    Estimated Creatinine Clearance: 95.9 mL/min (A) (by C-G formula based on SCr of 0.42 mg/dL (L)).    Allergies  Allergen Reactions  . Vicodin [Hydrocodone-Acetaminophen] Itching and Nausea Only    Antimicrobials this admission: Vanc 1/14 >>  Zosyn 1/14 >>   Microbiology results: Pending  Thank you for allowing pharmacy to be a part of this patient's care.  Kerrilynn Derenzo 07/26/2017 9:14 PM

## 2017-07-26 NOTE — ED Notes (Signed)
Given ice chips, per ED provider

## 2017-07-26 NOTE — ED Notes (Signed)
Patient transported to X-ray 

## 2017-07-26 NOTE — ED Notes (Signed)
Patient is resting comfortably. 

## 2017-07-27 ENCOUNTER — Inpatient Hospital Stay (HOSPITAL_COMMUNITY): Payer: BC Managed Care – PPO | Admitting: Certified Registered Nurse Anesthetist

## 2017-07-27 ENCOUNTER — Encounter (HOSPITAL_COMMUNITY)
Admission: EM | Disposition: A | Discharge status: Home / Self Care | Payer: Self-pay | Source: Home / Self Care | Attending: Internal Medicine

## 2017-07-27 DIAGNOSIS — K7030 Alcoholic cirrhosis of liver without ascites: Secondary | ICD-10-CM | POA: Diagnosis present

## 2017-07-27 DIAGNOSIS — E65 Localized adiposity: Secondary | ICD-10-CM | POA: Diagnosis present

## 2017-07-27 DIAGNOSIS — I10 Essential (primary) hypertension: Secondary | ICD-10-CM | POA: Diagnosis present

## 2017-07-27 DIAGNOSIS — S15301A Unspecified injury of right internal jugular vein, initial encounter: Secondary | ICD-10-CM | POA: Diagnosis present

## 2017-07-27 DIAGNOSIS — E876 Hypokalemia: Secondary | ICD-10-CM | POA: Diagnosis present

## 2017-07-27 DIAGNOSIS — J45909 Unspecified asthma, uncomplicated: Secondary | ICD-10-CM | POA: Diagnosis present

## 2017-07-27 DIAGNOSIS — D696 Thrombocytopenia, unspecified: Secondary | ICD-10-CM | POA: Diagnosis present

## 2017-07-27 DIAGNOSIS — F988 Other specified behavioral and emotional disorders with onset usually occurring in childhood and adolescence: Secondary | ICD-10-CM | POA: Diagnosis present

## 2017-07-27 DIAGNOSIS — M60009 Infective myositis, unspecified site: Secondary | ICD-10-CM | POA: Diagnosis present

## 2017-07-27 DIAGNOSIS — E119 Type 2 diabetes mellitus without complications: Secondary | ICD-10-CM

## 2017-07-27 HISTORY — PX: INCISION AND DRAINAGE ABSCESS: SHX5864

## 2017-07-27 LAB — CBC WITH DIFFERENTIAL/PLATELET
BASOS ABS: 0 10*3/uL (ref 0.0–0.1)
BASOS ABS: 0 10*3/uL (ref 0.0–0.1)
BASOS PCT: 0 %
Basophils Relative: 0 %
Eosinophils Absolute: 0 10*3/uL (ref 0.0–0.7)
Eosinophils Absolute: 0 10*3/uL (ref 0.0–0.7)
Eosinophils Relative: 0 %
Eosinophils Relative: 0 %
HEMATOCRIT: 33.4 % — AB (ref 36.0–46.0)
HEMATOCRIT: 29.6 % — AB (ref 36.0–46.0)
Hemoglobin: 11.7 g/dL — ABNORMAL LOW (ref 12.0–15.0)
Hemoglobin: 10.1 g/dL — ABNORMAL LOW (ref 12.0–15.0)
LYMPHS PCT: 6 %
Lymphocytes Relative: 4 %
Lymphs Abs: 0.7 10*3/uL (ref 0.7–4.0)
Lymphs Abs: 0.6 10*3/uL — ABNORMAL LOW (ref 0.7–4.0)
MCH: 28.5 pg (ref 26.0–34.0)
MCH: 28.8 pg (ref 26.0–34.0)
MCHC: 35 g/dL (ref 30.0–36.0)
MCHC: 34.1 g/dL (ref 30.0–36.0)
MCV: 81.3 fL (ref 78.0–100.0)
MCV: 84.3 fL (ref 78.0–100.0)
MONO ABS: 0.9 10*3/uL (ref 0.1–1.0)
MONOS PCT: 7 %
Monocytes Absolute: 0.8 10*3/uL (ref 0.1–1.0)
Monocytes Relative: 7 %
NEUTROS ABS: 12.4 10*3/uL — AB (ref 1.7–7.7)
NEUTROS PCT: 87 %
Neutro Abs: 10.2 10*3/uL — ABNORMAL HIGH (ref 1.7–7.7)
Neutrophils Relative %: 89 %
Platelets: 92 10*3/uL — ABNORMAL LOW (ref 150–400)
Platelets: 112 10*3/uL — ABNORMAL LOW (ref 150–400)
RBC: 4.11 MIL/uL (ref 3.87–5.11)
RBC: 3.51 MIL/uL — ABNORMAL LOW (ref 3.87–5.11)
RDW: 15.1 % (ref 11.5–15.5)
RDW: 15.2 % (ref 11.5–15.5)
WBC: 11.7 10*3/uL — AB (ref 4.0–10.5)
WBC: 13.9 10*3/uL — ABNORMAL HIGH (ref 4.0–10.5)

## 2017-07-27 LAB — COMPREHENSIVE METABOLIC PANEL
ALT: 11 U/L — ABNORMAL LOW (ref 14–54)
ANION GAP: 16 — AB (ref 5–15)
AST: 87 U/L — ABNORMAL HIGH (ref 15–41)
Albumin: 2.5 g/dL — ABNORMAL LOW (ref 3.5–5.0)
Alkaline Phosphatase: 213 U/L — ABNORMAL HIGH (ref 38–126)
BUN: 8 mg/dL (ref 6–20)
CALCIUM: 7 mg/dL — AB (ref 8.9–10.3)
CHLORIDE: 88 mmol/L — AB (ref 101–111)
CO2: 21 mmol/L — ABNORMAL LOW (ref 22–32)
CREATININE: 0.5 mg/dL (ref 0.44–1.00)
Glucose, Bld: 370 mg/dL — ABNORMAL HIGH (ref 65–99)
Potassium: 3.3 mmol/L — ABNORMAL LOW (ref 3.5–5.1)
Sodium: 125 mmol/L — ABNORMAL LOW (ref 135–145)
Total Bilirubin: 6.7 mg/dL — ABNORMAL HIGH (ref 0.3–1.2)
Total Protein: 7.8 g/dL (ref 6.5–8.1)

## 2017-07-27 LAB — POCT I-STAT 4, (NA,K, GLUC, HGB,HCT)
Glucose, Bld: 250 mg/dL — ABNORMAL HIGH (ref 65–99)
HCT: 35 % — ABNORMAL LOW (ref 36.0–46.0)
Hemoglobin: 11.9 g/dL — ABNORMAL LOW (ref 12.0–15.0)
Potassium: 3.5 mmol/L (ref 3.5–5.1)
Sodium: 132 mmol/L — ABNORMAL LOW (ref 135–145)

## 2017-07-27 LAB — C-REACTIVE PROTEIN: CRP: 21.2 mg/dL — ABNORMAL HIGH (ref <1.0)

## 2017-07-27 LAB — RAPID URINE DRUG SCREEN, HOSP PERFORMED
Amphetamines: NOT DETECTED
Barbiturates: NOT DETECTED
Benzodiazepines: NOT DETECTED
Cocaine: NOT DETECTED
OPIATES: POSITIVE — AB
Tetrahydrocannabinol: NOT DETECTED

## 2017-07-27 LAB — CBG MONITORING, ED
GLUCOSE-CAPILLARY: 354 mg/dL — AB (ref 65–99)
GLUCOSE-CAPILLARY: 331 mg/dL — AB (ref 65–99)
GLUCOSE-CAPILLARY: 257 mg/dL — AB (ref 65–99)
Glucose-Capillary: 277 mg/dL — ABNORMAL HIGH (ref 65–99)

## 2017-07-27 LAB — ETHANOL

## 2017-07-27 LAB — GLUCOSE, CAPILLARY
GLUCOSE-CAPILLARY: 289 mg/dL — AB (ref 65–99)
Glucose-Capillary: 247 mg/dL — ABNORMAL HIGH (ref 65–99)
Glucose-Capillary: 214 mg/dL — ABNORMAL HIGH (ref 65–99)

## 2017-07-27 LAB — APTT: aPTT: 50 seconds — ABNORMAL HIGH (ref 24–36)

## 2017-07-27 LAB — PROTIME-INR
INR: 1.43
PROTHROMBIN TIME: 17.3 s — AB (ref 11.4–15.2)

## 2017-07-27 LAB — HEMOGLOBIN A1C
HEMOGLOBIN A1C: 11.5 % — AB (ref 4.8–5.6)
MEAN PLASMA GLUCOSE: 283.35 mg/dL

## 2017-07-27 LAB — CK: CK TOTAL: 111 U/L (ref 38–234)

## 2017-07-27 LAB — I-STAT CG4 LACTIC ACID, ED: Lactic Acid, Venous: 1.46 mmol/L (ref 0.5–1.9)

## 2017-07-27 SURGERY — INCISION AND DRAINAGE, ABSCESS
Anesthesia: General | Site: Neck | Laterality: Right

## 2017-07-27 MED ORDER — LORAZEPAM 1 MG PO TABS: 0.0000 mg | ORAL_TABLET | Freq: Two times a day (BID) | ORAL | Status: DC

## 2017-07-27 MED ORDER — DEXTROSE-NACL 5-0.9 % IV SOLN
INTRAVENOUS | Status: DC
  Administered 2017-07-27: 250 mL via INTRAVENOUS

## 2017-07-27 MED ORDER — VITAMIN B-1 100 MG PO TABS
100.0000 mg | ORAL_TABLET | Freq: Every day | ORAL | Status: DC
  Administered 2017-07-28 – 2017-08-01 (×5): 100 mg via ORAL
  Filled 2017-07-27 (×5): qty 1

## 2017-07-27 MED ORDER — MIDAZOLAM HCL 5 MG/5ML IJ SOLN
INTRAMUSCULAR | Status: DC | PRN
  Administered 2017-07-27: 2 mg via INTRAVENOUS

## 2017-07-27 MED ORDER — POLYVINYL ALCOHOL 1.4 % OP SOLN: 2.0000 [drp] | OPHTHALMIC | Status: DC | PRN

## 2017-07-27 MED ORDER — ACETAMINOPHEN 650 MG RE SUPP: 650.0000 mg | Freq: Four times a day (QID) | RECTAL | Status: DC | PRN

## 2017-07-27 MED ORDER — INSULIN ASPART 100 UNIT/ML ~~LOC~~ SOLN
0.0000 [IU] | Freq: Three times a day (TID) | SUBCUTANEOUS | Status: DC
Start: 2017-07-27 — End: 2017-07-27

## 2017-07-27 MED ORDER — PROPOFOL 10 MG/ML IV BOLUS
INTRAVENOUS | Status: AC
  Filled 2017-07-27: qty 20

## 2017-07-27 MED ORDER — ALBUTEROL SULFATE HFA 108 (90 BASE) MCG/ACT IN AERS: 2.0000 | INHALATION_SPRAY | RESPIRATORY_TRACT | Status: DC | PRN

## 2017-07-27 MED ORDER — MIDAZOLAM HCL 2 MG/2ML IJ SOLN
INTRAMUSCULAR | Status: AC
  Filled 2017-07-27: qty 2

## 2017-07-27 MED ORDER — INSULIN REGULAR HUMAN 100 UNIT/ML IJ SOLN: 5.0000 [IU] | Freq: Once | INTRAMUSCULAR | Status: DC

## 2017-07-27 MED ORDER — SODIUM CHLORIDE 0.9 % IV SOLN
INTRAVENOUS | Status: DC
  Filled 2017-07-27: qty 1

## 2017-07-27 MED ORDER — LIDOCAINE-EPINEPHRINE 1 %-1:100000 IJ SOLN
INTRAMUSCULAR | Status: AC
  Filled 2017-07-27: qty 1

## 2017-07-27 MED ORDER — METOPROLOL TARTRATE 5 MG/5ML IV SOLN
5.0000 mg | Freq: Three times a day (TID) | INTRAVENOUS | Status: DC
  Administered 2017-07-27 – 2017-07-28 (×2): 5 mg via INTRAVENOUS
  Filled 2017-07-27 (×3): qty 5

## 2017-07-27 MED ORDER — POTASSIUM CHLORIDE 10 MEQ/100ML IV SOLN
10.0000 meq | INTRAVENOUS | Status: AC
  Administered 2017-07-27 (×2): 10 meq via INTRAVENOUS
  Filled 2017-07-27 (×2): qty 100

## 2017-07-27 MED ORDER — ROCURONIUM BROMIDE 10 MG/ML (PF) SYRINGE
PREFILLED_SYRINGE | INTRAVENOUS | Status: AC
  Filled 2017-07-27: qty 5

## 2017-07-27 MED ORDER — FENTANYL CITRATE (PF) 100 MCG/2ML IJ SOLN
25.0000 ug | INTRAMUSCULAR | Status: DC | PRN
  Administered 2017-07-27: 50 ug via INTRAVENOUS

## 2017-07-27 MED ORDER — ONDANSETRON HCL 4 MG/2ML IJ SOLN
INTRAMUSCULAR | Status: DC | PRN
  Administered 2017-07-27: 4 mg via INTRAVENOUS

## 2017-07-27 MED ORDER — ONDANSETRON HCL 4 MG/2ML IJ SOLN
INTRAMUSCULAR | Status: AC
  Filled 2017-07-27: qty 2

## 2017-07-27 MED ORDER — LORAZEPAM 2 MG/ML IJ SOLN
0.0000 mg | Freq: Two times a day (BID) | INTRAMUSCULAR | Status: DC
  Administered 2017-07-29: 1 mg via INTRAVENOUS
  Filled 2017-07-27: qty 1

## 2017-07-27 MED ORDER — IPRATROPIUM-ALBUTEROL 0.5-2.5 (3) MG/3ML IN SOLN: 3.0000 mL | Freq: Four times a day (QID) | RESPIRATORY_TRACT | Status: DC | PRN

## 2017-07-27 MED ORDER — INSULIN ASPART 100 UNIT/ML ~~LOC~~ SOLN
0.0000 [IU] | SUBCUTANEOUS | Status: DC
  Administered 2017-07-27: 7 [IU] via SUBCUTANEOUS
  Administered 2017-07-27 (×2): 5 [IU] via SUBCUTANEOUS
  Administered 2017-07-28 (×2): 3 [IU] via SUBCUTANEOUS
  Administered 2017-07-28: 9 [IU] via SUBCUTANEOUS
  Administered 2017-07-28: 3 [IU] via SUBCUTANEOUS
  Administered 2017-07-28: 5 [IU] via SUBCUTANEOUS
  Administered 2017-07-29: 7 [IU] via SUBCUTANEOUS
  Administered 2017-07-29: 3 [IU] via SUBCUTANEOUS
  Administered 2017-07-29 (×2): 5 [IU] via SUBCUTANEOUS
  Administered 2017-07-29: 3 [IU] via SUBCUTANEOUS
  Administered 2017-07-29: 5 [IU] via SUBCUTANEOUS
  Administered 2017-07-30: 3 [IU] via SUBCUTANEOUS
  Administered 2017-07-30 (×2): 1 [IU] via SUBCUTANEOUS
  Administered 2017-07-30 (×2): 2 [IU] via SUBCUTANEOUS
  Administered 2017-07-31: 1 [IU] via SUBCUTANEOUS
  Filled 2017-07-27 (×2): qty 1

## 2017-07-27 MED ORDER — ONDANSETRON HCL 4 MG/2ML IJ SOLN
4.0000 mg | Freq: Four times a day (QID) | INTRAMUSCULAR | Status: DC | PRN
  Administered 2017-07-27 – 2017-07-28 (×3): 4 mg via INTRAVENOUS
  Filled 2017-07-27 (×2): qty 2

## 2017-07-27 MED ORDER — ACETAMINOPHEN 500 MG PO TABS: 500.0000 mg | ORAL_TABLET | Freq: Four times a day (QID) | ORAL | Status: DC | PRN

## 2017-07-27 MED ORDER — DEXTROSE-NACL 5-0.45 % IV SOLN: INTRAVENOUS | Status: DC

## 2017-07-27 MED ORDER — INSULIN ASPART 100 UNIT/ML ~~LOC~~ SOLN
0.0000 [IU] | Freq: Every day | SUBCUTANEOUS | Status: DC
Start: 2017-07-27 — End: 2017-07-27

## 2017-07-27 MED ORDER — PROPOFOL 10 MG/ML IV BOLUS
INTRAVENOUS | Status: DC | PRN
  Administered 2017-07-27: 30 mg via INTRAVENOUS
  Administered 2017-07-27: 50 mg via INTRAVENOUS
  Administered 2017-07-27: 150 mg via INTRAVENOUS

## 2017-07-27 MED ORDER — FENTANYL CITRATE (PF) 100 MCG/2ML IJ SOLN
INTRAMUSCULAR | Status: AC
  Filled 2017-07-27: qty 2

## 2017-07-27 MED ORDER — LORAZEPAM 2 MG/ML IJ SOLN
0.0000 mg | Freq: Four times a day (QID) | INTRAMUSCULAR | Status: AC
  Administered 2017-07-27: 1 mg via INTRAVENOUS
  Administered 2017-07-27: 2 mg via INTRAVENOUS
  Administered 2017-07-27 – 2017-07-28 (×5): 1 mg via INTRAVENOUS
  Filled 2017-07-27 (×7): qty 1

## 2017-07-27 MED ORDER — SUCCINYLCHOLINE CHLORIDE 200 MG/10ML IV SOSY
PREFILLED_SYRINGE | INTRAVENOUS | Status: AC
  Filled 2017-07-27: qty 10

## 2017-07-27 MED ORDER — LIDOCAINE 2% (20 MG/ML) 5 ML SYRINGE
INTRAMUSCULAR | Status: DC | PRN
  Administered 2017-07-27: 100 mg via INTRAVENOUS

## 2017-07-27 MED ORDER — ACETAMINOPHEN 500 MG PO TABS: 500.0000 mg | ORAL_TABLET | Freq: Three times a day (TID) | ORAL | Status: DC | PRN

## 2017-07-27 MED ORDER — ONDANSETRON HCL 4 MG/2ML IJ SOLN
4.0000 mg | Freq: Once | INTRAMUSCULAR | Status: DC
  Filled 2017-07-27: qty 2

## 2017-07-27 MED ORDER — SODIUM CHLORIDE 0.9 % IV BOLUS (SEPSIS)
1000.0000 mL | Freq: Once | INTRAVENOUS | Status: AC
  Administered 2017-07-27: 1000 mL via INTRAVENOUS

## 2017-07-27 MED ORDER — LORAZEPAM 2 MG/ML IJ SOLN
1.0000 mg | Freq: Once | INTRAMUSCULAR | Status: AC
  Administered 2017-07-27: 1 mg via INTRAVENOUS
  Filled 2017-07-27: qty 1

## 2017-07-27 MED ORDER — SODIUM CHLORIDE 0.9 % IV SOLN
Freq: Once | INTRAVENOUS | Status: AC
  Administered 2017-07-27: 07:00:00 via INTRAVENOUS

## 2017-07-27 MED ORDER — LIDOCAINE 2% (20 MG/ML) 5 ML SYRINGE
INTRAMUSCULAR | Status: AC
  Filled 2017-07-27: qty 5

## 2017-07-27 MED ORDER — IBUPROFEN 400 MG PO TABS
600.0000 mg | ORAL_TABLET | Freq: Once | ORAL | Status: AC
  Administered 2017-07-27: 600 mg via ORAL
  Filled 2017-07-27: qty 1

## 2017-07-27 MED ORDER — LORAZEPAM 1 MG PO TABS: 0.0000 mg | ORAL_TABLET | Freq: Four times a day (QID) | ORAL | Status: AC

## 2017-07-27 MED ORDER — MIDAZOLAM HCL 2 MG/2ML IJ SOLN
2.0000 mg | Freq: Once | INTRAMUSCULAR | Status: AC
  Administered 2017-07-27: 2 mg via INTRAVENOUS
  Filled 2017-07-27: qty 2

## 2017-07-27 MED ORDER — SERTRALINE HCL 100 MG PO TABS: 100.0000 mg | ORAL_TABLET | Freq: Every day | ORAL | Status: DC

## 2017-07-27 MED ORDER — LIDOCAINE-EPINEPHRINE 1 %-1:100000 IJ SOLN
INTRAMUSCULAR | Status: DC | PRN
  Administered 2017-07-27: 1 mL

## 2017-07-27 MED ORDER — 0.9 % SODIUM CHLORIDE (POUR BTL) OPTIME
TOPICAL | Status: DC | PRN
  Administered 2017-07-27: 1000 mL

## 2017-07-27 MED ORDER — ACETAMINOPHEN 325 MG PO TABS: 650.0000 mg | ORAL_TABLET | Freq: Four times a day (QID) | ORAL | Status: DC | PRN

## 2017-07-27 MED ORDER — METOCLOPRAMIDE HCL 5 MG/ML IJ SOLN: 10.0000 mg | Freq: Once | INTRAMUSCULAR | Status: DC

## 2017-07-27 MED ORDER — ONDANSETRON HCL 4 MG/2ML IJ SOLN
INTRAMUSCULAR | Status: AC
  Administered 2017-07-27: 4 mg via INTRAVENOUS
  Filled 2017-07-27: qty 2

## 2017-07-27 MED ORDER — POTASSIUM CHLORIDE IN NACL 20-0.9 MEQ/L-% IV SOLN
INTRAVENOUS | Status: DC
  Administered 2017-07-27 (×2): via INTRAVENOUS
  Filled 2017-07-27 (×4): qty 1000

## 2017-07-27 MED ORDER — THIAMINE HCL 100 MG/ML IJ SOLN
Freq: Once | INTRAVENOUS | Status: DC
  Filled 2017-07-27: qty 1000

## 2017-07-27 MED ORDER — FENTANYL CITRATE (PF) 250 MCG/5ML IJ SOLN
INTRAMUSCULAR | Status: AC
  Filled 2017-07-27: qty 5

## 2017-07-27 MED ORDER — THIAMINE HCL 100 MG/ML IJ SOLN
100.0000 mg | Freq: Every day | INTRAMUSCULAR | Status: DC
  Administered 2017-07-27: 100 mg via INTRAVENOUS
  Filled 2017-07-27: qty 2

## 2017-07-27 MED ORDER — LIDOCAINE 5 % EX PTCH
1.0000 | MEDICATED_PATCH | CUTANEOUS | Status: DC
  Administered 2017-07-27: 1 via TRANSDERMAL
  Filled 2017-07-27 (×3): qty 1

## 2017-07-27 MED ORDER — ONDANSETRON HCL 4 MG PO TABS: 4.0000 mg | ORAL_TABLET | Freq: Four times a day (QID) | ORAL | Status: DC | PRN

## 2017-07-27 MED ORDER — PROPRANOLOL HCL 10 MG PO TABS: 10.0000 mg | ORAL_TABLET | Freq: Two times a day (BID) | ORAL | Status: DC

## 2017-07-27 MED ORDER — SUCCINYLCHOLINE CHLORIDE 200 MG/10ML IV SOSY
PREFILLED_SYRINGE | INTRAVENOUS | Status: DC | PRN
  Administered 2017-07-27: 100 mg via INTRAVENOUS

## 2017-07-27 MED ORDER — FENTANYL CITRATE (PF) 100 MCG/2ML IJ SOLN
INTRAMUSCULAR | Status: DC | PRN
  Administered 2017-07-27: 50 ug via INTRAVENOUS
  Administered 2017-07-27: 25 ug via INTRAVENOUS
  Administered 2017-07-27: 50 ug via INTRAVENOUS
  Administered 2017-07-27: 25 ug via INTRAVENOUS

## 2017-07-27 MED ORDER — MORPHINE SULFATE (PF) 4 MG/ML IV SOLN
1.0000 mg | INTRAVENOUS | Status: DC | PRN
  Administered 2017-07-27: 2 mg via INTRAVENOUS
  Administered 2017-07-27: 1 mg via INTRAVENOUS
  Administered 2017-07-28 – 2017-07-30 (×8): 2 mg via INTRAVENOUS
  Administered 2017-07-30 – 2017-07-31 (×6): 4 mg via INTRAVENOUS
  Filled 2017-07-27 (×16): qty 1

## 2017-07-27 MED ORDER — LACTATED RINGERS IV SOLN
Freq: Once | INTRAVENOUS | Status: AC
  Administered 2017-07-27: 15:00:00 via INTRAVENOUS

## 2017-07-27 SURGICAL SUPPLY — 28 items
BLADE SURG 15 STRL LF DISP TIS (BLADE) ×1 IMPLANT
BLADE SURG 15 STRL SS (BLADE) ×2
BNDG GAUZE ELAST 4 BULKY (GAUZE/BANDAGES/DRESSINGS) ×3 IMPLANT
CLEANER TIP ELECTROSURG 2X2 (MISCELLANEOUS) IMPLANT
COVER SURGICAL LIGHT HANDLE (MISCELLANEOUS) ×3 IMPLANT
DERMABOND ADVANCED (GAUZE/BANDAGES/DRESSINGS) ×2
DERMABOND ADVANCED .7 DNX12 (GAUZE/BANDAGES/DRESSINGS) ×1 IMPLANT
DRAIN PENROSE 1/4X12 LTX STRL (WOUND CARE) ×3 IMPLANT
DRAPE HALF SHEET 40X57 (DRAPES) IMPLANT
ELECT COATED BLADE 2.86 ST (ELECTRODE) ×3 IMPLANT
ELECT REM PT RETURN 9FT ADLT (ELECTROSURGICAL)
ELECTRODE REM PT RTRN 9FT ADLT (ELECTROSURGICAL) IMPLANT
GAUZE SPONGE 4X4 16PLY XRAY LF (GAUZE/BANDAGES/DRESSINGS) ×3 IMPLANT
GLOVE BIOGEL M 7.0 STRL (GLOVE) ×3 IMPLANT
GOWN STRL REUS W/ TWL LRG LVL3 (GOWN DISPOSABLE) ×2 IMPLANT
GOWN STRL REUS W/TWL LRG LVL3 (GOWN DISPOSABLE) ×4
KIT BASIN OR (CUSTOM PROCEDURE TRAY) ×3 IMPLANT
KIT ROOM TURNOVER OR (KITS) ×3 IMPLANT
NEEDLE HYPO 25GX1X1/2 BEV (NEEDLE) ×3 IMPLANT
NS IRRIG 1000ML POUR BTL (IV SOLUTION) ×3 IMPLANT
PAD ABD 8X10 STRL (GAUZE/BANDAGES/DRESSINGS) ×3 IMPLANT
PAD ARMBOARD 7.5X6 YLW CONV (MISCELLANEOUS) ×6 IMPLANT
PENCIL BUTTON HOLSTER BLD 10FT (ELECTRODE) IMPLANT
SUT ETHILON 3 0 PS 1 (SUTURE) ×3 IMPLANT
SUT VICRYL 4-0 PS2 18IN ABS (SUTURE) IMPLANT
SYR CONTROL 10ML LL (SYRINGE) ×3 IMPLANT
TOWEL OR 17X24 6PK STRL BLUE (TOWEL DISPOSABLE) ×3 IMPLANT
TRAY ENT MC OR (CUSTOM PROCEDURE TRAY) ×3 IMPLANT

## 2017-07-27 NOTE — ED Notes (Signed)
Patient is resting comfortably. 

## 2017-07-27 NOTE — ED Provider Notes (Signed)
Patient screen, by me in lieu of hospitalist admission, which is pending.  Currently patient is fairly comfortable, vocalizing well, without stridor.  She required nasal cannula oxygen to be placed, overnight.  She has had progressive swelling of her neck, cause not clear.  Reassuring labs from earlier today with mild persistent hyperglycemia.  Case discussed with hospitalist service who will see the patient, and discuss possible surgical options with ENT prior to feeding patient.   Mancel BaleWentz, Tarina Volk, MD 07/27/17 [phone]

## 2017-07-27 NOTE — ED Notes (Signed)
Requested something to help her rest. ED provider informed

## 2017-07-27 NOTE — ED Notes (Signed)
Right forearm IV infiltrated and painful. Attention brought to [facility] upon arrival. [facility] RN removed.

## 2017-07-27 NOTE — Progress Notes (Signed)
Dr. Sampson GoonFitzgerald, E notified that patient is in sinus tach rate 130 and as high as 150. Collect I- Stat 4 and he will be over to see patient.

## 2017-07-27 NOTE — Consult Note (Signed)
ENT CONSULT:  Reason for Consult: Left Neck Abscess Referring Physician:  [facility]  Angela Villegas is an 27 y.o. female.   HPI: Patient admitted via emergency department for progressive right neck abscess, with new onset elevated blood sugars and pancreatic dysfunction.  Patient has a history of chronic alcohol consumption.  She and her husband report a progressive history over the last 3 weeks of sore throat, respiratory tract symptoms and gradually enlarging swelling in the right superior neck.  The patient was treated with a 10-day course of Levaquin by her primary care physician.  Spite antibiotic therapy she continued to have progressive symptoms of swelling.  She presents to the [facility] and CT scan shows large soft tissue swelling adjacent to the right sternocleidomastoid muscle consistent with an abscess.  Past Medical History:  Diagnosis Date  . ADD (attention deficit disorder)   . Alcohol abuse   . Asthma   . Hypertension   . Liver disease, unspecified     History reviewed. No pertinent surgical history.  Family History  Problem Relation Age of Onset  . GER disease Mother     Social History:  reports that she has been smoking.  she has never used smokeless tobacco. She reports that she drinks alcohol. She reports that she does not use drugs.  Allergies:  Allergies  Allergen Reactions  . Vicodin [Hydrocodone-Acetaminophen] Itching and Nausea Only    Medications: I have reviewed the patient's current medications.  Results for orders placed or performed during the hospital encounter of 07/26/17 (from the past 48 hour(s))  Comprehensive metabolic panel     Status: Abnormal   Collection Time: 07/26/17  7:20 PM  Result Value Ref Range   Sodium 121 (L) 135 - 145 mmol/L   Potassium 3.4 (L) 3.5 - 5.1 mmol/L   Chloride 81 (L) 101 - 111 mmol/L   CO2 22 22 - 32 mmol/L   Glucose, Bld 410 (H) 65 - 99 mg/dL   BUN <5 (L) 6 - 20 mg/dL   Creatinine, Ser 0.42 (L)  0.44 - 1.00 mg/dL   Calcium 7.6 (L) 8.9 - 10.3 mg/dL   Total Protein 8.7 (H) 6.5 - 8.1 g/dL   Albumin 2.8 (L) 3.5 - 5.0 g/dL   AST 96 (H) 15 - 41 U/L   ALT 12 (L) 14 - 54 U/L   Alkaline Phosphatase 243 (H) 38 - 126 U/L   Total Bilirubin 5.4 (H) 0.3 - 1.2 mg/dL   GFR calc non Af Amer >60 >60 mL/min   GFR calc Af Amer >60 >60 mL/min    Comment: (NOTE) The eGFR has been calculated using the CKD EPI equation. This calculation has not been validated in all clinical situations. eGFR's persistently <60 mL/min signify possible Chronic Kidney Disease.    Anion gap 18 (H) 5 - 15  CBC with Differential     Status: Abnormal   Collection Time: 07/26/17  7:20 PM  Result Value Ref Range   WBC 12.5 (H) 4.0 - 10.5 K/uL   RBC 4.21 3.87 - 5.11 MIL/uL   Hemoglobin 12.0 12.0 - 15.0 g/dL   HCT 33.6 (L) 36.0 - 46.0 %   MCV 79.8 78.0 - 100.0 fL   MCH 28.5 26.0 - 34.0 pg   MCHC 35.7 30.0 - 36.0 g/dL   RDW 14.9 11.5 - 15.5 %   Platelets 101 (L) 150 - 400 K/uL    Comment: PLATELET COUNT CONFIRMED BY SMEAR   Neutrophils Relative %  83 %   Lymphocytes Relative 8 %   Monocytes Relative 9 %   Eosinophils Relative 0 %   Basophils Relative 0 %   Neutro Abs 10.4 (H) 1.7 - 7.7 K/uL   Lymphs Abs 1.0 0.7 - 4.0 K/uL   Monocytes Absolute 1.1 (H) 0.1 - 1.0 K/uL   Eosinophils Absolute 0.0 0.0 - 0.7 K/uL   Basophils Absolute 0.0 0.0 - 0.1 K/uL   Smear Review MORPHOLOGY UNREMARKABLE   Pregnancy, urine     Status: None   Collection Time: 07/26/17  8:00 PM  Result Value Ref Range   Preg Test, Ur NEGATIVE NEGATIVE    Comment:        THE SENSITIVITY OF THIS METHODOLOGY IS >20 mIU/mL.   Urinalysis, Routine w reflex microscopic     Status: Abnormal   Collection Time: 07/26/17  8:00 PM  Result Value Ref Range   Color, Urine YELLOW YELLOW   APPearance CLEAR CLEAR   Specific Gravity, Urine <1.005 (L) 1.005 - 1.030   pH 8.5 (H) 5.0 - 8.0   Glucose, UA >=500 (A) NEGATIVE mg/dL   Hgb urine dipstick LARGE (A)  NEGATIVE   Bilirubin Urine MODERATE (A) NEGATIVE   Ketones, ur 15 (A) NEGATIVE mg/dL   Protein, ur 100 (A) NEGATIVE mg/dL   Nitrite NEGATIVE NEGATIVE   Leukocytes, UA NEGATIVE NEGATIVE  Urinalysis, Microscopic (reflex)     Status: Abnormal   Collection Time: 07/26/17  8:00 PM  Result Value Ref Range   RBC / HPF 6-30 0 - 5 RBC/hpf   WBC, UA 0-5 0 - 5 WBC/hpf   Bacteria, UA FEW (A) NONE SEEN   Squamous Epithelial / LPF 0-5 (A) NONE SEEN   Mucus PRESENT   Urine rapid drug screen (hosp performed)     Status: Abnormal   Collection Time: 07/26/17  8:00 PM  Result Value Ref Range   Opiates POSITIVE (A) NONE DETECTED   Cocaine NONE DETECTED NONE DETECTED   Benzodiazepines NONE DETECTED NONE DETECTED   Amphetamines NONE DETECTED NONE DETECTED   Tetrahydrocannabinol NONE DETECTED NONE DETECTED   Barbiturates NONE DETECTED NONE DETECTED    Comment: (NOTE) DRUG SCREEN FOR MEDICAL PURPOSES ONLY.  IF CONFIRMATION IS NEEDED FOR ANY PURPOSE, NOTIFY LAB WITHIN 5 DAYS. LOWEST DETECTABLE LIMITS FOR URINE DRUG SCREEN Drug Class                     Cutoff (ng/mL) Amphetamine and metabolites    1000 Barbiturate and metabolites    200 Benzodiazepine                 433 Tricyclics and metabolites     300 Opiates and metabolites        300 Cocaine and metabolites        300 THC                            50   I-Stat venous blood gas, ED     Status: Abnormal   Collection Time: 07/26/17  9:20 PM  Result Value Ref Range   pH, Ven 7.504 (H) 7.250 - 7.430   pCO2, Ven 32.9 (L) 44.0 - 60.0 mmHg   pO2, Ven 39.0 32.0 - 45.0 mmHg   Bicarbonate 25.8 20.0 - 28.0 mmol/L   TCO2 27 22 - 32 mmol/L   O2 Saturation 79.0 %   Acid-Base Excess 3.0 (H) 0.0 - 2.0 mmol/L  Patient temperature 98.6 F    Collection site IV START    Drawn by Nurse    Sample type VENOUS    Comment NOTIFIED PHYSICIAN   POC CBG, ED     Status: Abnormal   Collection Time: 07/27/17  5:17 AM  Result Value Ref Range    Glucose-Capillary 354 (H) 65 - 99 mg/dL  Comprehensive metabolic panel     Status: Abnormal   Collection Time: 07/27/17  7:10 AM  Result Value Ref Range   Sodium 125 (L) 135 - 145 mmol/L   Potassium 3.3 (L) 3.5 - 5.1 mmol/L   Chloride 88 (L) 101 - 111 mmol/L   CO2 21 (L) 22 - 32 mmol/L   Glucose, Bld 370 (H) 65 - 99 mg/dL   BUN 8 6 - 20 mg/dL   Creatinine, Ser 0.50 0.44 - 1.00 mg/dL   Calcium 7.0 (L) 8.9 - 10.3 mg/dL   Total Protein 7.8 6.5 - 8.1 g/dL   Albumin 2.5 (L) 3.5 - 5.0 g/dL   AST 87 (H) 15 - 41 U/L   ALT 11 (L) 14 - 54 U/L   Alkaline Phosphatase 213 (H) 38 - 126 U/L   Total Bilirubin 6.7 (H) 0.3 - 1.2 mg/dL   GFR calc non Af Amer >60 >60 mL/min   GFR calc Af Amer >60 >60 mL/min    Comment: (NOTE) The eGFR has been calculated using the CKD EPI equation. This calculation has not been validated in all clinical situations. eGFR's persistently <60 mL/min signify possible Chronic Kidney Disease.    Anion gap 16 (H) 5 - 15  CBG monitoring, ED     Status: Abnormal   Collection Time: 07/27/17  7:47 AM  Result Value Ref Range   Glucose-Capillary 331 (H) 65 - 99 mg/dL  Hemoglobin A1c     Status: Abnormal   Collection Time: 07/27/17  8:02 AM  Result Value Ref Range   Hgb A1c MFr Bld 11.5 (H) 4.8 - 5.6 %    Comment: (NOTE) Pre diabetes:          5.7%-6.4% Diabetes:              >6.4% Glycemic control for   <7.0% adults with diabetes    Mean Plasma Glucose 283.35 mg/dL    Comment: Performed at [facility] [address], [place], [place] [address]  Ethanol     Status: None   Collection Time: 07/27/17  8:02 AM  Result Value Ref Range   Alcohol, Ethyl (B) <10 <10 mg/dL    Comment:        LOWEST DETECTABLE LIMIT FOR SERUM ALCOHOL IS 10 mg/dL FOR MEDICAL PURPOSES ONLY   CBC with Differential/Platelet     Status: Abnormal   Collection Time: 07/27/17  8:02 AM  Result Value Ref Range   WBC 11.7 (H) 4.0 - 10.5 K/uL   RBC 4.11 3.87 - 5.11 MIL/uL   Hemoglobin 11.7  (L) 12.0 - 15.0 g/dL   HCT 33.4 (L) 36.0 - 46.0 %   MCV 81.3 78.0 - 100.0 fL   MCH 28.5 26.0 - 34.0 pg   MCHC 35.0 30.0 - 36.0 g/dL   RDW 15.1 11.5 - 15.5 %   Platelets 92 (L) 150 - 400 K/uL    Comment: PLATELET COUNT CONFIRMED BY SMEAR REPEATED TO VERIFY SPECIMEN CHECKED FOR CLOTS    Neutrophils Relative % 87 %   Lymphocytes Relative 6 %   Monocytes Relative 7 %  Eosinophils Relative 0 %   Basophils Relative 0 %   Neutro Abs 10.2 (H) 1.7 - 7.7 K/uL   Lymphs Abs 0.7 0.7 - 4.0 K/uL   Monocytes Absolute 0.8 0.1 - 1.0 K/uL   Eosinophils Absolute 0.0 0.0 - 0.7 K/uL   Basophils Absolute 0.0 0.0 - 0.1 K/uL   WBC Morphology TOXIC GRANULATION     Comment: VACUOLATED NEUTROPHILS INCREASED BANDS (>20% BANDS)   C-reactive protein     Status: Abnormal   Collection Time: 07/27/17  8:02 AM  Result Value Ref Range   CRP 21.2 (H) <1.0 mg/dL    Comment: Performed at [facility] [address], [place], [place] [address]  CK     Status: None   Collection Time: 07/27/17  8:02 AM  Result Value Ref Range   Total CK 111 38 - 234 U/L  I-Stat CG4 Lactic Acid, ED     Status: None   Collection Time: 07/27/17  8:15 AM  Result Value Ref Range   Lactic Acid, Venous 1.46 0.5 - 1.9 mmol/L  Protime-INR     Status: Abnormal   Collection Time: 07/27/17  9:58 AM  Result Value Ref Range   Prothrombin Time 17.3 (H) 11.4 - 15.2 seconds   INR 1.43   APTT     Status: Abnormal   Collection Time: 07/27/17  9:58 AM  Result Value Ref Range   aPTT 50 (H) 24 - 36 seconds    Comment:        IF BASELINE aPTT IS ELEVATED, SUGGEST PATIENT RISK ASSESSMENT BE USED TO DETERMINE APPROPRIATE ANTICOAGULANT THERAPY.   CBG monitoring, ED     Status: Abnormal   Collection Time: 07/27/17 11:57 AM  Result Value Ref Range   Glucose-Capillary 277 (H) 65 - 99 mg/dL  CBG monitoring, ED     Status: Abnormal   Collection Time: 07/27/17  2:03 PM  Result Value Ref Range   Glucose-Capillary 257 (H) 65 - 99 mg/dL   Glucose, capillary     Status: Abnormal   Collection Time: 07/27/17  2:19 PM  Result Value Ref Range   Glucose-Capillary 247 (H) 65 - 99 mg/dL    Ct Soft Tissue Neck W Contrast  Result Date: 07/26/2017 CLINICAL DATA:  27 y/o F; large boil on right-sided neck just below mandible for 2 weeks with fever, nausea, and vomiting. EXAM: CT NECK WITH CONTRAST TECHNIQUE: Multidetector CT imaging of the neck was performed using the standard protocol following the bolus administration of intravenous contrast. CONTRAST:  26m ISOVUE-300 IOPAMIDOL (ISOVUE-300) INJECTION 61% COMPARISON:  None. FINDINGS: Pharynx and larynx: Normal. No mass or swelling. Salivary glands: Mild edema of the parotid gland. Other salivary glands are normal. Thyroid: Normal. Lymph nodes: Right anterior and posterior cervical chain, right suboccipital, and right submandibular lymphadenopathy. No lymph node necrosis. No enlarged left-sided cervical lymph nodes. 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 Limited intracranial: Negative. Visualized orbits: Negative. Mastoids and visualized paranasal sinuses: Clear. Skeleton: No acute or aggressive process. Upper chest: Negative. Other: Massive swelling of the right proximal sternocleidomastoid muscle with multiloculated areas of fluid attenuation spanning 6.2 x 4.7 x 5.3 cm (volume = 81 cm^3). Extensive surrounding inflammation throughout the subcutaneous fat of the right face, right anterior neck, extending across the midline below the chin. Edema in right masticator space, right parotid space, right parapharyngeal space, and right carotid space. Thickening of right platysma  muscle and right superficial parotid fascia. IMPRESSION: 1. 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 Total volume approximately 81 cc. 2. Extensive  inflammation throughout right masticator space, right parotid space, right parapharyngeal space, right carotid space, and throughout subcutaneous fat of right face, anterior neck, and chin. 3. 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 4. Right cervical, submandibular, and suboccipital lymphadenopathy, likely reactive. No lymph node necrosis. Electronically Signed   By: Kristine Garbe M.D.   On: 07/26/2017 20:02    ROS:ROS 12 systems reviewed and negative except as stated in HPI   Blood pressure (!) 157/97, pulse (!) 126, temperature 98.5 F (36.9 C), temperature source Oral, resp. rate 20, height _0  (1.549 m), weight 72.1 kg (159 lb), last menstrual period 07/20/2017, SpO2 98 %.  PHYSICAL EXAM: General appearance -patient alert in mild distress with pain and swelling of the right lateral neck. Nose - normal and patent, no erythema, discharge or polyps Mouth - mucous membranes moist, pharynx normal without lesions Neck -erythematous swelling in the right superior neck, tender to palpation.  Normal facial nerve function.  Studies Reviewed: CT scan of the neck as outlined above.  Assessment/Plan: The patient presents with increasing symptoms of swelling and pain in the right lateral neck.  CT scan consistent with a large abscess deep and surrounding the right sternal cleidomastoid muscle.  Given her history and presentation I recommended incision and drainage of the abscess under general anesthesia.  Risks and benefits were discussed in detail with the patient and her husband and they understand and agree with our plan.  Postoperatively the patient will be admitted to the hospital, medical service for additional management of infection and medical concerns.  We will follow the patient over her recovery.  [place], Arshan Jabs 07/27/2017, 2:56 PM

## 2017-07-27 NOTE — Anesthesia Preprocedure Evaluation (Addendum)
Anesthesia Evaluation  Patient identified by MRN, date of birth, ID band Patient awake    Reviewed: Allergy & Precautions, H&P , NPO status , Patient's Chart, lab work & pertinent test results, reviewed documented beta blocker date and time   Airway Mallampati: III  TM Distance: >3 FB Neck ROM: Full    Dental no notable dental hx. (+) Teeth Intact, Dental Advisory Given   Pulmonary asthma , Current Smoker,    Pulmonary exam normal breath sounds clear to auscultation       Cardiovascular hypertension, Pt. on medications and Pt. on home beta blockers  Rhythm:Regular Rate:Normal     Neuro/Psych PSYCHIATRIC DISORDERS Depression negative neurological ROS     GI/Hepatic negative GI ROS, (+)   ascites  substance abuse  alcohol use,   Endo/Other  diabetes, Insulin Dependent  Renal/GU negative Renal ROS  negative genitourinary   Musculoskeletal   Abdominal   Peds  Hematology negative hematology ROS (+)   Anesthesia Other Findings   Reproductive/Obstetrics negative OB ROS                            Anesthesia Physical Anesthesia Plan  ASA: III  Anesthesia Plan: General   Post-op Pain Management:    Induction: Intravenous, Rapid sequence and Cricoid pressure planned  PONV Risk Score and Plan: 3 and Ondansetron and Midazolam  Airway Management Planned: Oral ETT and Video Laryngoscope Planned  Additional Equipment:   Intra-op Plan:   Post-operative Plan: Extubation in OR  Informed Consent: I have reviewed the patients History and Physical, chart, labs and discussed the procedure including the risks, benefits and alternatives for the proposed anesthesia with the patient or authorized representative who has indicated his/her understanding and acceptance.   Dental advisory given  Plan Discussed with: CRNA  Anesthesia Plan Comments:        Anesthesia Quick Evaluation

## 2017-07-27 NOTE — ED Notes (Signed)
ED MD informed of BP 144/109

## 2017-07-27 NOTE — ED Notes (Signed)
Placed a external cath patient is resting with family at bedside and call bell in reach 

## 2017-07-27 NOTE — Progress Notes (Signed)
Craige CottaKirby, NP, notified STAT CBC ordered by Dr. Annalee GentaShoemaker for excess drainage from penrose drain with platelet count this am of 92. Will follow up once lab results.

## 2017-07-27 NOTE — ED Notes (Signed)
CBG 257  

## 2017-07-27 NOTE — Transfer of Care (Signed)
Immediate Anesthesia Transfer of Care Note  Patient: Angela Villegas  Procedure(s) Performed: INCISION AND DRAINAGE ABSCESS (Right Neck)  Patient Location: PACU  Anesthesia Type:General  Level of Consciousness: awake, alert  and oriented  Airway & Oxygen Therapy: Patient Spontanous Breathing and Patient connected to face mask oxygen  Post-op Assessment: Report given to RN, Post -op Vital signs reviewed and stable and Patient moving all extremities X 4  Post vital signs: Reviewed and stable  Last Vitals:  Vitals:   07/27/17 1452 07/27/17 1623  BP: (!) 157/97 (!) 151/103  Pulse: (!) 126 (!) 138  Resp:  10  Temp:  36.5 C  SpO2:  95%    Last Pain:  Vitals:   07/27/17 1623  TempSrc:   PainSc: (P) Asleep         Complications: No apparent anesthesia complications

## 2017-07-27 NOTE — ED Notes (Signed)
CBG 277  

## 2017-07-27 NOTE — ED Notes (Signed)
Pt placed on O2 at 2L per [place] for sats decreasing to 93% while sleeping.

## 2017-07-27 NOTE — ED Notes (Signed)
CBG is 331

## 2017-07-27 NOTE — ED Notes (Signed)
Got patient on the monitor patient is resting with call bell in reach  ?

## 2017-07-27 NOTE — Progress Notes (Signed)
Pt admitted to 5 west room 22 @ 1727 from PACU. Pt A&O x 4. Skin intact except where charted. Has Right sided neck incision from I&D on neck abscess. Some drainage noted on bandage. Will change and monitor. Pt asking for water, currently NPO, will ask MD for diet or ice chips. Husband at bedside. All questions/concerns answered and addressed. Will continue to monitor.

## 2017-07-27 NOTE — Progress Notes (Signed)
CSW made aware of consult for pt. CSW went to speak with pt at bedside, however RN informed CSW that pt has been taken the OR at this time. CSW will continue to follow for further needs.     Claude MangesKierra S. Kimberlynn Lumbra, MSW, LCSW-A Emergency Department Clinical Social Worker [phone]

## 2017-07-27 NOTE — H&P (Signed)
History and Physical    Angela Villegas ZOX:[id number] DOB: 05/03/1991 DOA: 07/26/2017  **Will admit patient based on the expectation that the patient will need hospitalization/ hospital care that crosses at least 2 midnights  PCP: Shirlean Mylar, MD   Attending physician: Ophelia Charter  Patient coming from/Resides with: Private residence/husband  Chief Complaint: Neck swelling, sleepiness, fevers and chills  HPI: Angela Villegas is a 27 y.o. female with medical history significant for central obesity, hypertension, asthma, ADD, and alcoholic cirrhosis.  History obtained primarily from patient's mother at the bedside about 2 weeks ago patient had cough and shortness of breath and was seen by PCP who prescribed Levaquin for presumed sinus infection versus acute bronchitis.  At that time she did not have any fevers or chills and seemed to improve from those symptoms.  Of note at the time of initial presentation she did have some swelling at the right jawline just below the ear.  Over the past week patient has had significant increase in size of this area and has become quite painful and discolored in appearance.  She is also been more sleepy than usual and has had poor appetite and poor oral intake.  She has developed fevers and chills some shortness of breath.  She did not have any difficulty managing oral secretions.  She presented to [facility] ED.  At time of presentation she was afebrile but later spiked a temperature to 100.5 F.  She was otherwise hemodynamically stable and not hypoxemic.  She had a white count of 12,500 neutrophils 83% and absolute neutrophils 10.1%.  She was found to have significant induration and swelling of the right face and neck with these areas being very tender to palpation.  CT soft tissue of neck revealed massive swelling of the right proximal sternocleidomastoid muscle with multiloculated areas of fluid likely representing pyomyositis with associated extensive inflammation  throughout the right masticator space, right parotid space, right parapharyngeal space, right carotid space and throughout the subcutaneous fat of the right face, anterior neck and chin.  The largest area of fluid collection measures 6.2 x 4.7 x 5.3 cm with an estimated volume of 81 cc.  She was also found to have severe stenosis of the upper internal jugular vein for mass-effect due to inflammation.  She was started on empiric broad-spectrum antibiotics and given analgesics and IV fluids.  EDP contacted ENT/Dr. Jenne Pane who determined there was no acute ENT surgical emergency given patient inability to protect airway and ability to control oral secretions.  ENT recommended internal medicine admission and request was placed for stepdown bed at [facility].  In addition to the above findings patient was also found to be severely hyperglycemic with initial presenting glucose of 410 noting no prior diagnosis of diabetes.  Her anion gap was 18 with a CO2 of 22.  She had associated pseudohyponatremia with a sodium of 121.  She was given IV and subcutaneous insulin.  It was felt she had new onset diabetes possibly from the pancreatic dysfunction from ongoing alcoholism as well as alcoholic ketoacidosis.  Patient admitted to drinking 1/5 of gin daily.  Several hours after the patient was opted for admission to this facility it was determined there were no stepdown beds available at [facility] or at [facility] therefore EDP Dr. Clarene Duke at [facility] determined that patient was appropriate for ED to ED transfer in order to initiate therapies that cannot be provided at that particular freestanding ED as well as to be near surgical intervention  if she were to have any airway compromise secondary to her significant neck infection with abscess.  [name] has subsequently arrived to [facility] ED bed C 27.  ED Course:  Vital Signs: BP (!) 143/94   Pulse 92   Temp 98.5 F (36.9 C) (Oral)   Resp 20   Ht 5\' 1"  (1.549 m)    Wt 72.1 kg (159 lb)   LMP 07/20/2017   SpO2 100%   BMI 30.04 kg/m  CT soft tissue neck: As above Lab data: Sodium 125, potassium 3.3, chloride 88, CO2 21, glucose 370, BUN 8, creatinine 0.5, calcium 7, anion gap 16, alkaline phosphatase 213, albumin 2.5, AST 87, ALT 11, total bilirubin 6.7, CK 111, lactic acid 1.46, white count 11,700 neutrophils 87%, absolute neutrophils 10.2%, hemoglobin 11.7, platelets 92,000, PT 17.3, INR 1.43, PTT 50 (these are follow-up labs from when she initially presented yesterday evening prior to midnight), urinalysis with moderate bilirubin, greater than 500 of glucose, large amount of hemoglobin, ketones 15, pH 8.5, protein 100, specific gravity <1.005, few bacteria, 6-30 RBCs, 0-5 WBCs, urine pregnancy negative, ETOH <10, UDS negative except for opiates noting this was obtained after patient received opiates in ER. Medications and treatments: Saline bolus times 2 L, morphine IV 4 mg x 2 doses, Zofran 4 mg IV x 3 doses, vancomycin 1 g IV x1, Zosyn 3.375 g IV x1, potassium IV 10 mEq x3, Dilaudid 1 mg IV x1, Ativan 1 mg IV x1, ibuprofen 600 mg p.o. x1, thiamine 100 mg IV x1, Ativan 2 mg IV x1, insulin 5 units subcu x1, insulin 7 units subcu x1,  Review of Systems:  In addition to the HPI above,  No Headache, changes with Vision or hearing, new weakness, tingling, numbness in any extremity, dizziness, dysarthria or word finding difficulty, gait disturbance or imbalance, tremors or seizure activity No problems with new indigestion/reflux, choking or coughing while eating, abdominal pain with or after eating No Chest pain, Cough or Shortness of Breath, palpitations, orthopnea or DOE No Abdominal pain, melena,hematochezia, dark tarry stools, constipation No dysuria, malodorous urine, hematuria or flank pain No new skin rashes, lesions, No new joint pains, aches, swelling or redness No recent unintentional weight gain or loss No polyuria, polydypsia or polyphagia   Past  Medical History:  Diagnosis Date  . ADD (attention deficit disorder)   . Alcohol abuse   . Asthma   . Hypertension   . Liver disease, unspecified     History reviewed. No pertinent surgical history.  Social History   Socioeconomic History  . Marital status: Married    Spouse name: Not on file  . Number of children: Not on file  . Years of education: Not on file  . Highest education level: Not on file  Social Needs  . Financial resource strain: Not on file  . Food insecurity - worry: Not on file  . Food insecurity - inability: Not on file  . Transportation needs - medical: Not on file  . Transportation needs - non-medical: Not on file  Occupational History  . Not on file  Tobacco Use  . Smoking status: Current Every Day Smoker  . Smokeless tobacco: Never Used  Substance and Sexual Activity  . Alcohol use: Yes    Comment: 1 fifth of liquor daily or more   . Drug use: No  . Sexual activity: Not on file  Other Topics Concern  . Not on file  Social History Narrative  . Not on file  Mobility: Independent Work history: Not obtained   Allergies  Allergen Reactions  . Vicodin [Hydrocodone-Acetaminophen] Itching and Nausea Only    Family History  Problem Relation Age of Onset  . GER disease Mother    Family history reviewed; denies family history of diabetes  Prior to Admission medications   Medication Sig Start Date End Date Taking? Authorizing Provider  amphetamine-dextroamphetamine (ADDERALL XR) 15 MG 24 hr capsule Take 15 mg by mouth every morning.   Yes [provider]  amphetamine-dextroamphetamine (ADDERALL) 10 MG tablet Take 10 mg by mouth every evening.  12/21/14  Yes [provider]  fluticasone (FLONASE) 50 MCG/ACT nasal spray Place 2 sprays into both nostrils daily as needed for allergies.    Yes [provider]  guaifenesin (ROBITUSSIN) 100 MG/5ML syrup Take 200 mg by mouth 3 (three) times daily as needed for cough.   Yes  [provider]  Hypromellose (ARTIFICIAL TEARS OP) Apply to eye.   Yes [provider]  ibuprofen (ADVIL,MOTRIN) 200 MG tablet Take 400 mg by mouth every 6 (six) hours as needed for headache or mild pain.   Yes [provider]  olopatadine (PATANOL) 0.1 % ophthalmic solution Place 1 drop into the right eye daily. 06/08/17  Yes [provider]  PROAIR HFA 108 (90 Base) MCG/ACT inhaler Inhale 2 puffs into the lungs every 4 (four) hours as needed for wheezing. 07/05/17  Yes [provider]  propranolol (INDERAL) 10 MG tablet Take 10 mg by mouth 2 (two) times daily.   Yes [provider]  Pseudoephedrine-APAP-DM (DAYQUIL MULTI-SYMPTOM COLD/FLU PO) Take 30 mLs by mouth daily as needed (cough).   Yes [provider]  sertraline (ZOLOFT) 100 MG tablet Take 100 mg by mouth daily.   Yes [provider]  furosemide (LASIX) 40 MG tablet Take 1 tablet (40 mg total) by mouth daily. Patient not taking: Reported on 07/27/2017 12/13/15   Albertine Grates, MD  oxycodone (OXY-IR) 5 MG capsule Take 1 capsule (5 mg total) by mouth every 4 (four) hours as needed. Patient not taking: Reported on 07/27/2017 12/13/15   Albertine Grates, MD  spironolactone (ALDACTONE) 100 MG tablet Take 1 tablet (100 mg total) by mouth daily. Patient not taking: Reported on 07/27/2017 12/13/15   Albertine Grates, MD    Physical Exam: Vitals:   07/27/17 1115 07/27/17 1130 07/27/17 1145 07/27/17 1215  BP:  (!) 144/112  (!) 143/94  Pulse: 99 (!) 112 86 92  Resp: 19 (!) 26 (!) 24 20  Temp:      TempSrc:      SpO2: 98% 99% 98% 100%  Weight:      Height:          Constitutional: Somewhat lethargic but in no acute physiologic distress, anxious, uncomfortable contrary to ongoing neck pain Eyes: PERRL, lids and conjunctivae normal-bilateral scleral icterus. ENMT: Mucous membranes are dry.  Difficult to completely visualize posterior pharynx given inability to open mouth fully secondary to neck  pain and swelling..Abnormal dentition with multiple posterior upper and lower dental caries.  Fresh blood oozing noted around gumline of upper teeth.  Patient is not drooling and able to manage oral secretions.  No stridor. Neck: Abnormal (primarily on the right), with marked large induration with associated skin ecchymosis right neck at jawline with estimated size of induration 10 cm x 4 cm, unable to determine if has thyromegaly.  Extensive edema extending into the right face and down the neck towards the chest.  Left  neck normal without similar findings. Respiratory: clear to auscultation bilaterally, no wheezing, no crackles. Normal respiratory effort. No accessory muscle use.  Cardiovascular: Regular rate and rhythm, no murmurs / rubs / gallops. No extremity edema. 2+ pedal pulses. No carotid bruits.  Abdomen: no tenderness, no masses palpated.  Mild hepatomegaly with liver dull percussed 1-2 fingerbreadths below the rib cage on right.  No definitive splenomegaly appreciated on external exam, bowel sounds positive.  Notable for central obesity. Musculoskeletal: no clubbing / cyanosis. No joint deformity upper and lower extremities. Good ROM, no contractures. Normal muscle tone.  Skin: no rashes, lesions, ulcers. No induration Neurologic: CN 2-12 grossly intact. Sensation intact, DTR normal. Strength 5/5 x all 4 extremities.  Psychiatric: Normal judgment and insight.  Sleepy but awakens easily and is able to ask questions, oriented x 3.  Mildly anxious mood.    Labs on Admission: I have personally reviewed following labs and imaging studies  CBC: Recent Labs  Lab 07/26/17 1920 07/27/17 0802  WBC 12.5* 11.7*  NEUTROABS 10.4* 10.2*  HGB 12.0 11.7*  HCT 33.6* 33.4*  MCV 79.8 81.3  PLT 101* 92*   Basic Metabolic Panel: Recent Labs  Lab 07/26/17 1920 07/27/17 0710  NA 121* 125*  K 3.4* 3.3*  CL 81* 88*  CO2 22 21*  GLUCOSE 410* 370*  BUN <5* 8  CREATININE 0.42* 0.50  CALCIUM 7.6*  7.0*   GFR: Estimated Creatinine Clearance: 95.9 mL/min (by C-G formula based on SCr of 0.5 mg/dL). Liver Function Tests: Recent Labs  Lab 07/26/17 1920 07/27/17 0710  AST 96* 87*  ALT 12* 11*  ALKPHOS 243* 213*  BILITOT 5.4* 6.7*  PROT 8.7* 7.8  ALBUMIN 2.8* 2.5*   No results for input(s): LIPASE, AMYLASE in the last 168 hours. No results for input(s): AMMONIA in the last 168 hours. Coagulation Profile: Recent Labs  Lab 07/27/17 0958  INR 1.43   Cardiac Enzymes: Recent Labs  Lab 07/27/17 0802  CKTOTAL 111   BNP (last 3 results) No results for input(s): PROBNP in the last 8760 hours. HbA1C: No results for input(s): HGBA1C in the last 72 hours. CBG: Recent Labs  Lab 07/27/17 0517 07/27/17 0747 07/27/17 1157  GLUCAP 354* 331* 277*   Lipid Profile: No results for input(s): CHOL, HDL, LDLCALC, TRIG, CHOLHDL, LDLDIRECT in the last 72 hours. Thyroid Function Tests: No results for input(s): TSH, T4TOTAL, FREET4, T3FREE, THYROIDAB in the last 72 hours. Anemia Panel: No results for input(s): VITAMINB12, FOLATE, FERRITIN, TIBC, IRON, RETICCTPCT in the last 72 hours. Urine analysis:    Component Value Date/Time   COLORURINE YELLOW 07/26/2017 2000   APPEARANCEUR CLEAR 07/26/2017 2000   LABSPEC <1.005 (L) 07/26/2017 2000   PHURINE 8.5 (H) 07/26/2017 2000   GLUCOSEU >=500 (A) 07/26/2017 2000   HGBUR LARGE (A) 07/26/2017 2000   BILIRUBINUR MODERATE (A) 07/26/2017 2000   KETONESUR 15 (A) 07/26/2017 2000   PROTEINUR 100 (A) 07/26/2017 2000   NITRITE NEGATIVE 07/26/2017 2000   LEUKOCYTESUR NEGATIVE 07/26/2017 2000   Sepsis Labs: @LABRCNTIP (procalcitonin:4,lacticidven:4) )No results found for this or any previous visit (from the past 240 hour(s)).   Radiological Exams on Admission: Ct Soft Tissue Neck W Contrast  Result Date: 07/26/2017 CLINICAL DATA:  27 y/o F; large boil on right-sided neck just below mandible for 2 weeks with fever, nausea, and vomiting. EXAM: CT  NECK WITH CONTRAST TECHNIQUE: Multidetector CT imaging of the neck was performed using the standard protocol following the bolus administration of intravenous contrast.  CONTRAST:  75mL ISOVUE-300 IOPAMIDOL (ISOVUE-300) INJECTION 61% COMPARISON:  None. FINDINGS: Pharynx and larynx: Normal. No mass or swelling. Salivary glands: Mild edema of the parotid gland. Other salivary glands are normal. Thyroid: Normal. Lymph nodes: Right anterior and posterior cervical chain, right suboccipital, and right submandibular lymphadenopathy. No lymph node necrosis. No enlarged left-sided cervical lymph nodes. 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 Limited intracranial: Negative. Visualized orbits: Negative. Mastoids and visualized paranasal sinuses: Clear. Skeleton: No acute or aggressive process. Upper chest: Negative. Other: Massive swelling of the right proximal sternocleidomastoid muscle with multiloculated areas of fluid attenuation spanning 6.2 x 4.7 x 5.3 cm (volume = 81 cm^3). Extensive surrounding inflammation throughout the subcutaneous fat of the right face, right anterior neck, extending across the midline below the chin. Edema in right masticator space, right parotid space, right parapharyngeal space, and right carotid space. Thickening of right platysma muscle and right superficial parotid fascia. IMPRESSION: 1. 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 Total volume approximately 81 cc. 2. Extensive inflammation throughout right masticator space, right parotid space, right parapharyngeal space, right carotid space, and throughout subcutaneous fat of right face, anterior neck, and chin. 3. 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 4. Right cervical, submandibular, and suboccipital  lymphadenopathy, likely reactive. No lymph node necrosis. Electronically Signed   By: Mitzi HansenLance  Furusawa-Stratton M.D.   On: 07/26/2017 20:02    Assessment/Plan Principal Problem:   Cellulitis and abscess of neck/Pyomyositis of neck -Patient presents with marked swelling and pain of right neck after recent treatment with antibiotics (Levaquin) for bronchitis versus sinus infection noting patient had reported "knot" at right jawline below the ear at time of initial treatment -Patient now with multiloculated abscess collections as well as a large abscess collection and associated subcutaneous and muscle tissue inflammation causing pyomyositis as well as mass-effect causing stenosis to right internal jugular vein (see below) -ENT consulted after arrival to [facility]-Dr. Annalee GentaShoemaker to see-he anticipates likely surgical intervention for I/D of abscess is later today therefore patient to remain NPO-patient and significant other updated -Continue empiric broad-spectrum antibiotics with IV Zosyn/vancomycin -Obtain blood cultures -Lactic acid normal, CK normal -Obtain C-reactive protein-can follow trends to determine response to antibiotics -IV morphine 1-4 mg every 2 hours prn pain noting unable to utilize NSAIDs secondary to thrombocytopenia platelets less than 100,000 as well as active blood issues at gumline context of mild coagulopathy -Lidoderm patch to right neck -Warm or cold compress to right neck -RN may call if patient develops fever-total bilirubin up to 6.7 from baseline 2.2 therefore use acetaminophen cautiously  Active Problems:   Injury (inflammation/mass effect) of right internal jugular vein -Discussed with VVS on-call for 1/15 -No acute intervention needed with recommendation to utilize DVT prophylaxis level anticoagulation; VVS aware with patient with low platelets and active bleeding-discussed with attending and postoperatively consider DVT prophylactic dose i.e. low-dose IV heparin which can  be rapidly reversed -VVS also reinforced that primary treatment would be to decompress abscess/fluid collections that are causing mass-effect against the vein to minimize inflammatory effect and secondary development of thrombosis    Acute hypokalemia -Has received several small potassium boluses at previous facility -Add potassium to maintenance fluid -Follow labs    Diabetes mellitus, new onset  -No prior history of diabetes denies family history of same -Presents with CBG greater than 400 with  anion gap of 18 normal CO2 -HgbA1c -C-peptide -?  Expected secondary to central obesity vs 2/2 pancreatic dysfunction from ongoing alcoholism -Follow CBGs q 4 hrs and provide SSI -Will eventually need formal diabetes educator consult and likely will need prescription for glucometer and supplies at discharge    Alcoholic cirrhosis/Thrombocytopenia with coagulopathy -Patient admits to drinking 1/5 of gin daily -Mild coagulopathy at present with PTT 50 and PT 17.3 -Platelets 92,000 and patient with mild oozing of blood at upper gumline -Based on total bilirubin around 2.2 with current total bilirubin 6.7 -AST and ALT around baseline -On Inderal prior to admission-suspect she has underlying esophageal varices -Has been seen by [facility] in the past -Previous measure discriminate function was 40 (> 32 documented as "of concern") with a previous meld score of 21 -Discussed with patient that absolute cessation of alcohol indicated; discussed with patient and significant other that once patient medically stable and ready for discharge she needs to establish with either AA or alcohol rehab treatment center for outpatient therapies    Hypertension -Currently uncontrolled in the context of significant pain, evolving sepsis physiology and alcohol withdrawal -PCP recently discontinued Lasix and apparently is no longer on Aldactone? -No prior echocardiogram in system    Asthma -Currently stable without  wheezing or respiratory compromise -Patient not hypoxemic but is utilizing oxygen for comfort -On Proventil MDI at home -Have ordered DuoNeb every 6 hours prn    ADD (attention deficit disorder) -On Adderall at home    Central obesity-BMI 30.06 -Patient carries majority of her weight from her hips up primarily in her abdomen which is a risk factor not only for diabetes but for cardiovascular disease      DVT prophylaxis: SCD noting platelets less than 100,000 Code Status: Full Family Communication: Significant other at bedside Disposition Plan: Home Consults called: ENT/Shoemaker    ELLIS,ALLISON L. ANP-BC [facility] Pager [phone]   If 7PM-7AM, please contact night-coverage [email or web address] Password [facility]  07/27/2017, 12:26 PM

## 2017-07-27 NOTE — Progress Notes (Addendum)
Chart reviewed including labs and imaging. CBGs very high with elevated AG-no prior h/o DM documented. Pt is known alcoholic and prior admission reported daily Vodka intake. Suspect she has new onset DM in the context of pancreatic dysfunction 2/2 excessive ETOH intake. I have ordered FU CBC/CMET as well as UDS, ETOH level, c-peptide, coags and HgbA1c. EDP documented that Dr. Jenne PaneBates w/ENT consulted and rec IM admit. CT reveals massive R prox SCM muscle edema w/ fluid/abscess measuring 6.2 x 4.7 x 5.3 with extensive inflammation throughout the subcutaneous fat of the right face, right anterior neck and infra chin.  These findings concerning for pyomyositis.  There was also severe stenosis of the upper internal jugular vein from mass-effect due to inflammation.  Patient has underlying thrombocytopenia/coagulopathy from known cirrhosis with current platelet count 101,000.  [facility] transport to this facility to a stepdown bed. CIWA has been ordered.  Given hyperglycemia I also ordered carb modified diet and SSI with CBG checks AC/HS. If anion gap remains elevated may need to transition patient to insulin infusion.  Continue maintenance IV fluids at 100/hr. patient has an associated pseudohyponatremia with alkalosis noted on venous blood gas and may require additional volume resuscitation in the context of hyperglycemia.  In addition we will also check lactic acid, CK and CRP.  Spoke with Dr. Clarene DukeLittle who [facility]  Made aware [facility]  Also discussed with her labs and other orders I placed and she was in agreement.  We have decided on a plan of transferring patient ED to ED so she can arrive at this facility due to availability of ENT surgeon.  My attending physician Dr. Ophelia CharterYates is also been made aware of all of the above.  Junious SilkAllison Ismael Karge, ANP

## 2017-07-27 NOTE — ED Provider Notes (Addendum)
Medical screening examination/treatment/procedure(s) were conducted as a shared visit with non-physician practitioner(s) and myself.  I personally evaluated the patient during the encounter. Briefly, the patient is a 27 y.o. female with a history of daily alcohol use presents to the emergency department with right facial swelling.  On exam patient has right facial and neck swelling.  Patent airway with no respiratory distress.  Workup consistent with likely pyomyositis of the right sternocleidomastoid with extensive inflammation of the right facial structures.  Patient was started on IV antibiotics.  Case was discussed with Dr. Jenne PaneBates from ENT who recommended medicine admission.  Workup also notable for hyperglycemia with anion gap, but no acidosis.  This is likely secondary to chronic alcohol consumption.  Patient placed on C waw.  Admitted to medicine for further management.      Nira Connardama, Danay Mckellar Eduardo, MD 07/27/17 [id number]

## 2017-07-27 NOTE — ED Provider Notes (Addendum)
I received this patient in signout from Dr. Nicanor AlconPalumbo. She was originally seen by evening team, Dr. Eudelia Bunchardama and Terance HartKelly Gekas. [facility]. I spoke with hospitalist Revonda StandardAllison regarding patient's medical complexity and need for higher level of care. We agreed that she would benefit from ED to ED transfer in order to initiate some therapies that we cannot begin here at freestanding ED, as well as to be near surgical intervention if she has any airway compromise from for neck infection. Repeat labs this morning show Na 125, K 3.3, Cl 88, CO2 21 (previous 22), glucose 370 (previous 410), AG 16 (previous 18). She has continued to require ativan for CIWA scores. She is awake, alert, protecting airway on repeat exam, hypertensive and HR 115, normal O2 sat. Although she likely has some degree of alcoholic ketoacidosis, I believe she also has a component of new diabetes mellitus and would benefit from insulin to close her gap. I have ordered  1L IVF bolus and IV potassium repletion; hospitalist has ordered SQ insulin. Repeat lactate normal. Discussed transfer with [facility].   CRITICAL CARE Performed by: Ambrose Finlandachel Morgan Nyeli Holtmeyer   Total critical care time: 30 minutes  Critical care time was exclusive of separately billable procedures and treating other patients.  Critical care was necessary to treat or prevent imminent or life-threatening deterioration.  Critical care was time spent personally by me on the following activities: development of treatment plan with patient and/or surrogate as well as nursing, discussions with consultants, evaluation of patient's response to treatment, examination of patient, obtaining history from patient or surrogate, ordering and performing treatments and interventions, ordering and review of laboratory studies, ordering and review of radiographic studies, pulse oximetry and re-evaluation of patient's condition.    Laterrica Libman, Ambrose Finlandachel Morgan, MD 07/27/17 [phone]    Laurence SpatesLittle,  Mitra Duling Morgan, MD 07/27/17 [phone]

## 2017-07-27 NOTE — Anesthesia Procedure Notes (Signed)
Procedure Name: Intubation Date/Time: 07/27/2017 3:27 PM Performed by: Nils PyleBell, Skiler Olden T, CRNA Pre-anesthesia Checklist: Patient identified, Emergency Drugs available, Suction available and Patient being monitored Patient Re-evaluated:Patient Re-evaluated prior to induction Oxygen Delivery Method: Circle System Utilized Preoxygenation: Pre-oxygenation with 100% oxygen Induction Type: IV induction, Rapid sequence and Cricoid Pressure applied Laryngoscope Size: Glidescope and 3 Grade View: Grade I Tube type: Oral Tube size: 7.0 mm Number of attempts: 1 Airway Equipment and Method: Stylet and Oral airway Placement Confirmation: ETT inserted through vocal cords under direct vision,  positive ETCO2 and breath sounds checked- equal and bilateral Secured at: 22 cm Tube secured with: Tape Dental Injury: Teeth and Oropharynx as per pre-operative assessment

## 2017-07-27 NOTE — Progress Notes (Signed)
Dr. Annalee GentaShoemaker notified of excessive drainage noted from penrose drain to right neck. Informed of saturated dressing changes x2. First dressing change- bloody with clots. Second dressing change more serosanguinous drainage/clots. Verbal order received for STAT CBC. Will notify covering triad provider as well. Will continue to monitor.

## 2017-07-27 NOTE — Brief Op Note (Signed)
07/27/2017  4:01 PM  PATIENT:  Dorian FurnacePreeta Ramella  27 y.o. female  PRE-OPERATIVE DIAGNOSIS:  right neck abscess  POST-OPERATIVE DIAGNOSIS:  right neck abscess  PROCEDURE:  Procedure(s): INCISION AND DRAINAGE ABSCESS (Right)  SURGEON:  Surgeon(s) and Role:    Osborn Coho* Athziri Freundlich, MD - Primary  PHYSICIAN ASSISTANT:   ASSISTANTS: none   ANESTHESIA:   general  EBL:  20 mL   BLOOD ADMINISTERED:none  DRAINS: Penrose drain in the right neck incision   LOCAL MEDICATIONS USED:  LIDOCAINE  and Amount: 1 ml  SPECIMEN:  Source of Specimen:  Right neck abscess  DISPOSITION OF SPECIMEN:  Microbiology  COUNTS:  YES  TOURNIQUET:  * No tourniquets in log *  DICTATION: .Other Dictation: Dictation Number [id number]  PLAN OF CARE: Admit to inpatient   PATIENT DISPOSITION:  PACU - hemodynamically stable.   Delay start of Pharmacological VTE agent (>24hrs) due to surgical blood loss or risk of bleeding: not applicable

## 2017-07-27 NOTE — ED Notes (Signed)
Family at bedside. 

## 2017-07-28 ENCOUNTER — Inpatient Hospital Stay (HOSPITAL_COMMUNITY): Payer: BC Managed Care – PPO | Admitting: Certified Registered"

## 2017-07-28 ENCOUNTER — Encounter (HOSPITAL_COMMUNITY)
Admission: EM | Disposition: A | Discharge status: Home / Self Care | Payer: Self-pay | Source: Home / Self Care | Attending: Internal Medicine

## 2017-07-28 ENCOUNTER — Encounter (HOSPITAL_COMMUNITY): Payer: Self-pay | Admitting: Certified Registered"

## 2017-07-28 DIAGNOSIS — L0211 Cutaneous abscess of neck: Principal | ICD-10-CM

## 2017-07-28 DIAGNOSIS — L03221 Cellulitis of neck: Secondary | ICD-10-CM

## 2017-07-28 HISTORY — PX: EXCISION MASS NECK: SHX6703

## 2017-07-28 LAB — COMPREHENSIVE METABOLIC PANEL
ALK PHOS: 170 U/L — AB (ref 38–126)
ALT: 10 U/L — AB (ref 14–54)
AST: 86 U/L — ABNORMAL HIGH (ref 15–41)
Albumin: 2.1 g/dL — ABNORMAL LOW (ref 3.5–5.0)
Anion gap: 15 (ref 5–15)
BUN: 9 mg/dL (ref 6–20)
CALCIUM: 6.7 mg/dL — AB (ref 8.9–10.3)
CO2: 19 mmol/L — ABNORMAL LOW (ref 22–32)
CREATININE: 0.62 mg/dL (ref 0.44–1.00)
Chloride: 93 mmol/L — ABNORMAL LOW (ref 101–111)
Glucose, Bld: 248 mg/dL — ABNORMAL HIGH (ref 65–99)
Potassium: 3.4 mmol/L — ABNORMAL LOW (ref 3.5–5.1)
Sodium: 127 mmol/L — ABNORMAL LOW (ref 135–145)
Total Bilirubin: 6.8 mg/dL — ABNORMAL HIGH (ref 0.3–1.2)
Total Protein: 6.6 g/dL (ref 6.5–8.1)

## 2017-07-28 LAB — POCT I-STAT 4, (NA,K, GLUC, HGB,HCT)
Glucose, Bld: 238 mg/dL — ABNORMAL HIGH (ref 65–99)
HCT: 22 % — ABNORMAL LOW (ref 36.0–46.0)
Hemoglobin: 7.5 g/dL — ABNORMAL LOW (ref 12.0–15.0)
POTASSIUM: 3.4 mmol/L — AB (ref 3.5–5.1)
SODIUM: 129 mmol/L — AB (ref 135–145)

## 2017-07-28 LAB — CBC
HCT: 25.5 % — ABNORMAL LOW (ref 36.0–46.0)
HEMATOCRIT: 27.4 % — AB (ref 36.0–46.0)
HEMOGLOBIN: 9.2 g/dL — AB (ref 12.0–15.0)
Hemoglobin: 8.4 g/dL — ABNORMAL LOW (ref 12.0–15.0)
MCH: 27.9 pg (ref 26.0–34.0)
MCH: 28.8 pg (ref 26.0–34.0)
MCHC: 32.9 g/dL (ref 30.0–36.0)
MCHC: 33.6 g/dL (ref 30.0–36.0)
MCV: 84.7 fL (ref 78.0–100.0)
MCV: 85.9 fL (ref 78.0–100.0)
PLATELETS: 131 10*3/uL — AB (ref 150–400)
Platelets: 146 10*3/uL — ABNORMAL LOW (ref 150–400)
RBC: 3.01 MIL/uL — AB (ref 3.87–5.11)
RBC: 3.19 MIL/uL — AB (ref 3.87–5.11)
RDW: 15.4 % (ref 11.5–15.5)
RDW: 14.9 % (ref 11.5–15.5)
WBC: 13.6 10*3/uL — AB (ref 4.0–10.5)
WBC: 11.3 10*3/uL — AB (ref 4.0–10.5)

## 2017-07-28 LAB — ABO/RH: ABO/RH(D): AB POS

## 2017-07-28 LAB — GLUCOSE, CAPILLARY
GLUCOSE-CAPILLARY: 267 mg/dL — AB (ref 65–99)
GLUCOSE-CAPILLARY: 243 mg/dL — AB (ref 65–99)
GLUCOSE-CAPILLARY: 206 mg/dL — AB (ref 65–99)
GLUCOSE-CAPILLARY: 377 mg/dL — AB (ref 65–99)
Glucose-Capillary: 209 mg/dL — ABNORMAL HIGH (ref 65–99)
Glucose-Capillary: 242 mg/dL — ABNORMAL HIGH (ref 65–99)
Glucose-Capillary: 270 mg/dL — ABNORMAL HIGH (ref 65–99)

## 2017-07-28 LAB — HIV ANTIBODY (ROUTINE TESTING W REFLEX): HIV Screen 4th Generation wRfx: NONREACTIVE

## 2017-07-28 LAB — PROTIME-INR
INR: 1.53
Prothrombin Time: 18.2 seconds — ABNORMAL HIGH (ref 11.4–15.2)

## 2017-07-28 LAB — MRSA PCR SCREENING: MRSA by PCR: NEGATIVE

## 2017-07-28 LAB — C-PEPTIDE: C PEPTIDE: 1.3 ng/mL (ref 1.1–4.4)

## 2017-07-28 LAB — PREPARE RBC (CROSSMATCH)

## 2017-07-28 SURGERY — EXCISION, MASS, NECK
Anesthesia: General | Site: Neck | Laterality: Right

## 2017-07-28 MED ORDER — DEXAMETHASONE SODIUM PHOSPHATE 10 MG/ML IJ SOLN
INTRAMUSCULAR | Status: AC
Start: 2017-07-28 — End: ?
  Filled 2017-07-28: qty 1

## 2017-07-28 MED ORDER — BACITRACIN ZINC 500 UNIT/GM EX OINT
TOPICAL_OINTMENT | CUTANEOUS | Status: DC | PRN
  Administered 2017-07-28: 1 via TOPICAL

## 2017-07-28 MED ORDER — BACITRACIN ZINC 500 UNIT/GM EX OINT
TOPICAL_OINTMENT | Freq: Two times a day (BID) | CUTANEOUS | Status: DC
  Administered 2017-07-28 – 2017-07-31 (×7): via TOPICAL
  Filled 2017-07-28: qty 28.35

## 2017-07-28 MED ORDER — FENTANYL CITRATE (PF) 100 MCG/2ML IJ SOLN
INTRAMUSCULAR | Status: DC | PRN
  Administered 2017-07-28 (×2): 50 ug via INTRAVENOUS
  Administered 2017-07-28: 100 ug via INTRAVENOUS
  Administered 2017-07-28 (×3): 50 ug via INTRAVENOUS

## 2017-07-28 MED ORDER — ETOMIDATE 2 MG/ML IV SOLN
INTRAVENOUS | Status: DC | PRN
  Administered 2017-07-28: 18 mg via INTRAVENOUS

## 2017-07-28 MED ORDER — DEXAMETHASONE SODIUM PHOSPHATE 10 MG/ML IJ SOLN
INTRAMUSCULAR | Status: DC | PRN
  Administered 2017-07-28: 4 mg via INTRAVENOUS

## 2017-07-28 MED ORDER — LIDOCAINE 2% (20 MG/ML) 5 ML SYRINGE
INTRAMUSCULAR | Status: AC
Start: 2017-07-28 — End: ?
  Filled 2017-07-28: qty 5

## 2017-07-28 MED ORDER — ROCURONIUM BROMIDE 10 MG/ML (PF) SYRINGE
PREFILLED_SYRINGE | INTRAVENOUS | Status: AC
Start: 2017-07-28 — End: ?
  Filled 2017-07-28: qty 5

## 2017-07-28 MED ORDER — ONDANSETRON HCL 4 MG/2ML IJ SOLN
INTRAMUSCULAR | Status: AC
  Filled 2017-07-28: qty 2

## 2017-07-28 MED ORDER — PROPOFOL 10 MG/ML IV BOLUS
INTRAVENOUS | Status: AC
  Filled 2017-07-28: qty 20

## 2017-07-28 MED ORDER — LIDOCAINE 2% (20 MG/ML) 5 ML SYRINGE
INTRAMUSCULAR | Status: DC | PRN
  Administered 2017-07-28: 100 mg via INTRAVENOUS

## 2017-07-28 MED ORDER — LACTATED RINGERS IV SOLN
INTRAVENOUS | Status: DC
  Administered 2017-07-28 (×2): via INTRAVENOUS

## 2017-07-28 MED ORDER — PROPOFOL 10 MG/ML IV BOLUS: INTRAVENOUS | Status: DC | PRN

## 2017-07-28 MED ORDER — EPHEDRINE 5 MG/ML INJ
INTRAVENOUS | Status: AC
  Filled 2017-07-28: qty 10

## 2017-07-28 MED ORDER — SODIUM CHLORIDE 0.9 % IV SOLN
Freq: Once | INTRAVENOUS | Status: AC
  Administered 2017-07-28: 08:00:00 via INTRAVENOUS

## 2017-07-28 MED ORDER — ROCURONIUM BROMIDE 100 MG/10ML IV SOLN
INTRAVENOUS | Status: DC | PRN
  Administered 2017-07-28: 40 mg via INTRAVENOUS

## 2017-07-28 MED ORDER — PHENYLEPHRINE 40 MCG/ML (10ML) SYRINGE FOR IV PUSH (FOR BLOOD PRESSURE SUPPORT)
PREFILLED_SYRINGE | INTRAVENOUS | Status: AC
Start: 2017-07-28 — End: ?
  Filled 2017-07-28: qty 10

## 2017-07-28 MED ORDER — 0.9 % SODIUM CHLORIDE (POUR BTL) OPTIME
TOPICAL | Status: DC | PRN
  Administered 2017-07-28: 1000 mL

## 2017-07-28 MED ORDER — BACITRACIN ZINC 500 UNIT/GM EX OINT
TOPICAL_OINTMENT | CUTANEOUS | Status: AC
  Filled 2017-07-28: qty 28.35

## 2017-07-28 MED ORDER — SUCCINYLCHOLINE CHLORIDE 20 MG/ML IJ SOLN
INTRAMUSCULAR | Status: DC | PRN
  Administered 2017-07-28: 100 mg via INTRAVENOUS

## 2017-07-28 MED ORDER — SUCCINYLCHOLINE CHLORIDE 200 MG/10ML IV SOSY
PREFILLED_SYRINGE | INTRAVENOUS | Status: AC
  Filled 2017-07-28: qty 10

## 2017-07-28 MED ORDER — SUGAMMADEX SODIUM 200 MG/2ML IV SOLN
INTRAVENOUS | Status: AC
  Filled 2017-07-28: qty 2

## 2017-07-28 MED ORDER — SUGAMMADEX SODIUM 200 MG/2ML IV SOLN
INTRAVENOUS | Status: DC | PRN
  Administered 2017-07-28: 200 mg via INTRAVENOUS

## 2017-07-28 MED ORDER — FENTANYL CITRATE (PF) 250 MCG/5ML IJ SOLN
INTRAMUSCULAR | Status: AC
  Filled 2017-07-28: qty 5

## 2017-07-28 MED ORDER — ONDANSETRON HCL 4 MG/2ML IJ SOLN
INTRAMUSCULAR | Status: DC | PRN
  Administered 2017-07-28: 4 mg via INTRAVENOUS

## 2017-07-28 MED ORDER — SODIUM CHLORIDE 0.9 % IV SOLN
INTRAVENOUS | Status: DC | PRN
  Administered 2017-07-28: 12:00:00 via INTRAVENOUS

## 2017-07-28 SURGICAL SUPPLY — 64 items
ATTRACTOMAT 16X20 MAGNETIC DRP (DRAPES) IMPLANT
BLADE SURG 15 STRL LF DISP TIS (BLADE) IMPLANT
BLADE SURG 15 STRL SS (BLADE)
BNDG GAUZE ELAST 4 BULKY (GAUZE/BANDAGES/DRESSINGS) ×3 IMPLANT
CANISTER SUCT 3000ML PPV (MISCELLANEOUS) ×3 IMPLANT
CATH FOLEY 2WAY SLVR  5CC 14FR (CATHETERS)
CATH FOLEY 2WAY SLVR 5CC 14FR (CATHETERS) IMPLANT
CLEANER TIP ELECTROSURG 2X2 (MISCELLANEOUS) ×3 IMPLANT
CONT SPEC 4OZ CLIKSEAL STRL BL (MISCELLANEOUS) IMPLANT
CORDS BIPOLAR (ELECTRODE) IMPLANT
COVER SURGICAL LIGHT HANDLE (MISCELLANEOUS) IMPLANT
CRADLE DONUT ADULT HEAD (MISCELLANEOUS) ×3 IMPLANT
DERMABOND ADHESIVE PROPEN (GAUZE/BANDAGES/DRESSINGS) ×2
DERMABOND ADVANCED .7 DNX6 (GAUZE/BANDAGES/DRESSINGS) ×1 IMPLANT
DRAIN JACKSON PRT FLT 10 (DRAIN) IMPLANT
DRAIN PENROSE 1/2X12 LTX STRL (WOUND CARE) ×3 IMPLANT
DRAIN PENROSE 1/4X12 LTX STRL (WOUND CARE) IMPLANT
DRAIN SNY 10 ROU (WOUND CARE) IMPLANT
DRAPE HALF SHEET 40X57 (DRAPES) IMPLANT
DRAPE ORTHO SPLIT 77X108 STRL (DRAPES)
DRAPE SURG ORHT 6 SPLT 77X108 (DRAPES) IMPLANT
ELECT COATED BLADE 2.86 ST (ELECTRODE) ×3 IMPLANT
ELECT REM PT RETURN 9FT ADLT (ELECTROSURGICAL) ×3
ELECT REM PT RETURN 9FT PED (ELECTROSURGICAL)
ELECTRODE REM PT RETRN 9FT PED (ELECTROSURGICAL) IMPLANT
ELECTRODE REM PT RTRN 9FT ADLT (ELECTROSURGICAL) ×1 IMPLANT
EVACUATOR SILICONE 100CC (DRAIN) IMPLANT
GAUZE SPONGE 4X4 12PLY STRL (GAUZE/BANDAGES/DRESSINGS) IMPLANT
GAUZE SPONGE 4X4 12PLY STRL LF (GAUZE/BANDAGES/DRESSINGS) ×3 IMPLANT
GAUZE SPONGE 4X4 16PLY XRAY LF (GAUZE/BANDAGES/DRESSINGS) ×3 IMPLANT
GLOVE BIO SURGEON STRL SZ 6.5 (GLOVE) ×2 IMPLANT
GLOVE BIO SURGEONS STRL SZ 6.5 (GLOVE) ×1
GLOVE BIOGEL M 7.0 STRL (GLOVE) ×6 IMPLANT
GLOVE BIOGEL PI IND STRL 6.5 (GLOVE) ×4 IMPLANT
GLOVE BIOGEL PI INDICATOR 6.5 (GLOVE) ×8
GLOVE SURG SS PI 6.5 STRL IVOR (GLOVE) ×3 IMPLANT
GOWN STRL NON-REIN LRG LVL3 (GOWN DISPOSABLE) ×3 IMPLANT
GOWN STRL REUS W/ TWL LRG LVL3 (GOWN DISPOSABLE) ×3 IMPLANT
GOWN STRL REUS W/TWL LRG LVL3 (GOWN DISPOSABLE) ×6
KIT BASIN OR (CUSTOM PROCEDURE TRAY) ×3 IMPLANT
KIT ROOM TURNOVER OR (KITS) ×3 IMPLANT
LOCATOR NERVE 3 VOLT (DISPOSABLE) ×3 IMPLANT
NEEDLE HYPO 25GX1X1/2 BEV (NEEDLE) IMPLANT
NS IRRIG 1000ML POUR BTL (IV SOLUTION) ×3 IMPLANT
PACK CAROTID (CUSTOM PROCEDURE TRAY) ×3 IMPLANT
PAD ARMBOARD 7.5X6 YLW CONV (MISCELLANEOUS) ×6 IMPLANT
PENCIL BUTTON HOLSTER BLD 10FT (ELECTRODE) ×3 IMPLANT
SPONGE INTESTINAL PEANUT (DISPOSABLE) ×3 IMPLANT
STAPLER VISISTAT 35W (STAPLE) ×3 IMPLANT
SUT ETHILON 3 0 PS 1 (SUTURE) ×3 IMPLANT
SUT ETHILON 5 0 PS 2 18 (SUTURE) IMPLANT
SUT SILK 2 0 PERMA HAND 18 BK (SUTURE) IMPLANT
SUT SILK 3 0 REEL (SUTURE) ×6 IMPLANT
SUT VIC AB 3-0 PS2 18 (SUTURE) ×2
SUT VIC AB 3-0 PS2 18XBRD (SUTURE) ×1 IMPLANT
SUT VIC AB 4-0 P-3 18X BRD (SUTURE) ×1 IMPLANT
SUT VIC AB 4-0 P3 18 (SUTURE) ×2
SUT VICRYL 4-0 PS2 18IN ABS (SUTURE) ×3 IMPLANT
SWAB COLLECTION DEVICE MRSA (MISCELLANEOUS) IMPLANT
SWAB CULTURE ESWAB REG 1ML (MISCELLANEOUS) IMPLANT
TOWEL OR 17X24 6PK STRL BLUE (TOWEL DISPOSABLE) ×3 IMPLANT
TRAY ENT MC OR (CUSTOM PROCEDURE TRAY) ×3 IMPLANT
TUBING SUCTION BULK 100 FT (MISCELLANEOUS) IMPLANT
WATER STERILE IRR 1000ML POUR (IV SOLUTION) ×3 IMPLANT

## 2017-07-28 NOTE — Progress Notes (Signed)
ENT Progress Note: POD #1 s/p Procedure(s): exploration of right neck   Subjective: Patient awake and alert, minimal discomfort  Objective: Vital signs in last 24 hours: Temp:  [97.7 F (36.5 C)-99.8 F (37.7 C)] 99.8 F (37.7 C) (01/16 0630) Pulse Rate:  [86-138] 103 (01/16 0630) Resp:  [10-26] 20 (01/16 0630) BP: (139-170)/(90-117) 139/92 (01/16 0630) SpO2:  [95 %-100 %] 96 % (01/16 0630) Weight:  [76.2 kg (167 lb 15.9 oz)] 76.2 kg (167 lb 15.9 oz) (01/16 0630) Weight change: 4.078 kg (8 lb 15.9 oz) Last BM Date: 07/27/17  Intake/Output from previous day: 01/15 0701 - 01/16 0700 In: 3767.5 [I.V.:3067.5; IV Piggyback:500] Out: 20 [Blood:20] Intake/Output this shift: No intake/output data recorded.  Labs: Recent Labs    07/27/17 2227 07/28/17 0520  WBC 13.9* 13.6*  HGB 10.1* 8.4*  HCT 29.6* 25.5*  PLT 112* 131*   Recent Labs    07/27/17 0710 07/27/17 1458 07/28/17 0520  NA 125* 132* 127*  K 3.3* 3.5 3.4*  CL 88*  --  93*  CO2 21*  --  19*  GLUCOSE 370* 250* 248*  BUN 8  --  9  CALCIUM 7.0*  --  6.7*    Studies/Results: Ct Soft Tissue Neck W Contrast  Result Date: 07/26/2017 CLINICAL DATA:  27 y/o F; large boil on right-sided neck just below mandible for 2 weeks with fever, nausea, and vomiting. EXAM: CT NECK WITH CONTRAST TECHNIQUE: Multidetector CT imaging of the neck was performed using the standard protocol following the bolus administration of intravenous contrast. CONTRAST:  75mL ISOVUE-300 IOPAMIDOL (ISOVUE-300) INJECTION 61% COMPARISON:  None. FINDINGS: Pharynx and larynx: Normal. No mass or swelling. Salivary glands: Mild edema of the parotid gland. Other salivary glands are normal. Thyroid: Normal. Lymph nodes: Right anterior and posterior cervical chain, right suboccipital, and right submandibular lymphadenopathy. No lymph node necrosis. No enlarged left-sided cervical lymph nodes. 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 Limited intracranial: Negative. Visualized orbits: Negative. Mastoids and visualized paranasal sinuses: Clear. Skeleton: No acute or aggressive process. Upper chest: Negative. Other: Massive swelling of the right proximal sternocleidomastoid muscle with multiloculated areas of fluid attenuation spanning 6.2 x 4.7 x 5.3 cm (volume = 81 cm^3). Extensive surrounding inflammation throughout the subcutaneous fat of the right face, right anterior neck, extending across the midline below the chin. Edema in right masticator space, right parotid space, right parapharyngeal space, and right carotid space. Thickening of right platysma muscle and right superficial parotid fascia. IMPRESSION: 1. 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 Total volume approximately 81 cc. 2. Extensive inflammation throughout right masticator space, right parotid space, right parapharyngeal space, right carotid space, and throughout subcutaneous fat of right face, anterior neck, and chin. 3. 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 4. Right cervical, submandibular, and suboccipital lymphadenopathy, likely reactive. No lymph node necrosis. Electronically Signed   By: Mitzi HansenLance  Furusawa-Stratton M.D.   On: 07/26/2017 20:02     PHYSICAL EXAM: Right lateral neck incision with serosanguineous drainage.  Patient's neck dressing with moderate bloody discharge.  Right lateral neck with no swelling or additional erythema.   Assessment/Plan: The patient underwent incision and drainage of a large right lateral neck deep abscess on 07/27/17.  The patient tolerated her surgical procedure with minimal bloody discharge.  Over the course of the night the  patient developed increasing serosanguineous discharge from her Penrose drain.  Concerns raised regarding  possible vascular injury at the time of her I&D versus possible infectious rupture of a right neck blood vessel.  Given her findings of recommended return to the operating room for exploration, identify bleeding site and cauterize or ligate as appropriate.  Risks and benefits of the procedure were discussed in detail with the patient and her husband and they understood and agreed with her surgery which will be scheduled on an emergency basis.   Karena Kinker 07/28/2017, 10:01 AM

## 2017-07-28 NOTE — Anesthesia Procedure Notes (Signed)
Procedure Name: Intubation Date/Time: 07/28/2017 11:09 AM Performed by: Julian ReilWelty, Rashena Dowling F, CRNA Pre-anesthesia Checklist: Patient identified, Emergency Drugs available, Suction available, Patient being monitored and Timeout performed Patient Re-evaluated:Patient Re-evaluated prior to induction Oxygen Delivery Method: Circle system utilized Preoxygenation: Pre-oxygenation with 100% oxygen Induction Type: IV induction Laryngoscope Size: Glidescope and 3 Grade View: Grade I Tube type: Oral Tube size: 7.0 mm Number of attempts: 1 Airway Equipment and Method: Stylet and Video-laryngoscopy Placement Confirmation: ETT inserted through vocal cords under direct vision,  positive ETCO2 and breath sounds checked- equal and bilateral Secured at: 23 cm Tube secured with: Tape Dental Injury: Teeth and Oropharynx as per pre-operative assessment  Comments: Glidescope used due to edematous, short neck and limited mouth opening due to discomfort.

## 2017-07-28 NOTE — Progress Notes (Signed)
Inpatient Diabetes Program Recommendations  AACE/ADA: New Consensus Statement on Inpatient Glycemic Control (2015)  Target Ranges:  Prepandial:   less than 140 mg/dL      Peak postprandial:   less than 180 mg/dL (1-2 hours)      Critically ill patients:  140 - 180 mg/dL   Lab Results  Component Value Date   GLUCAP 206 (H) 07/28/2017   HGBA1C 11.5 (H) 07/27/2017    Review of Glycemic ControlResults for Angela FurnaceSHAIKH, Angela (MRN [id number]) as of 07/28/2017 12:57  Ref. Range 07/27/2017 16:28 07/27/2017 20:13 07/28/2017 01:06 07/28/2017 04:03 07/28/2017 08:05  Glucose-Capillary Latest Ref Range: 65 - 99 mg/dL 782214 (H) 956289 (H) 213267 (H) 243 (H) 206 (H)    Diabetes history: New onset DM Outpatient Diabetes medications: None Current orders for Inpatient glycemic control:  Novolog sensitive q 4 hours  Inpatient Diabetes Program Recommendations:    Note admit and current A1C.  ? New diagnosis of DM.  Blood sugars continue to be > 200 mg/dL.  May consider adding Lantus 15 units daily.  Will follow.  Patient will need education, follow-up and medications for DM at D/C. Patient currently in PACU.  Will follow up on 07/29/17.   Thanks,  Beryl MeagerJenny Jahanna Raether, RN, BC-ADM Inpatient Diabetes Coordinator Pager [phone] (8a-5p)

## 2017-07-28 NOTE — Anesthesia Preprocedure Evaluation (Signed)
Anesthesia Evaluation  Patient identified by MRN, date of birth, ID band Patient awake    Reviewed: Allergy & Precautions, H&P , NPO status , Patient's Chart, lab work & pertinent test results, reviewed documented beta blocker date and time   Airway Mallampati: III  TM Distance: >3 FB Neck ROM: Full    Dental no notable dental hx. (+) Teeth Intact, Dental Advisory Given   Pulmonary asthma , Current Smoker,    Pulmonary exam normal breath sounds clear to auscultation       Cardiovascular hypertension, Pt. on medications and Pt. on home beta blockers  Rhythm:Regular Rate:Normal     Neuro/Psych PSYCHIATRIC DISORDERS Depression negative neurological ROS     GI/Hepatic negative GI ROS, (+)     substance abuse  alcohol use, Alcoholic hepatitis   Endo/Other  diabetes, Type 2, Insulin Dependent  Renal/GU negative Renal ROS  negative genitourinary   Musculoskeletal negative musculoskeletal ROS (+)   Abdominal   Peds  Hematology  (+) Blood dyscrasia (Thrombocytopenia), anemia ,   Anesthesia Other Findings   Reproductive/Obstetrics negative OB ROS                             Anesthesia Physical  Anesthesia Plan  ASA: III and emergent  Anesthesia Plan: General   Post-op Pain Management:    Induction: Intravenous, Rapid sequence and Cricoid pressure planned  PONV Risk Score and Plan: 3 and Ondansetron, Midazolam and Dexamethasone  Airway Management Planned: Oral ETT and Video Laryngoscope Planned  Additional Equipment:   Intra-op Plan:   Post-operative Plan: Extubation in OR and Possible Post-op intubation/ventilation  Informed Consent: I have reviewed the patients History and Physical, chart, labs and discussed the procedure including the risks, benefits and alternatives for the proposed anesthesia with the patient or authorized representative who has indicated his/her understanding  and acceptance.   Dental advisory given  Plan Discussed with: CRNA  Anesthesia Plan Comments:         Anesthesia Quick Evaluation

## 2017-07-28 NOTE — Progress Notes (Signed)
PROGRESS NOTE    Angela Villegas  ZOX:[id number] DOB: [birth year] DOA: 07/26/2017 PCP: Shirlean Mylar, MD    Brief Narrative: Angela Villegas is a 27 y.o. female with a Past Medical History of alcoholic cirrhosis; HTN; and ADHD who presents with right jaw/facial cellulitis.  Patient presented to [facility] last evening for failure of outpatient therapy (Levaquin).  Patient with elevated glucose to 410, anion gap of 18, but no apparent acidosis.  CT was concerning for a large loculated abscess and Dr. Jenne Pane recommended transfer to [facility].  She was given Vanc and Zosyn and placed on CIWA protocol.  She eventually had an ER to ER transfer.  She underwent incision and drainage of right neck abscess 1-15. Overnight she has been bleeding at incision site. 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  Continue with IV antibiotics.  Follow up cultures.  Leukocytosis persist.   2-Acute blood loss anemia.  Bleeding from surgical site.  Type and screen. Check INR.  Platelet at 131.  Transfuse two units. IV fluids.  Change status to step down unit.  I have contacted Dr Annalee Genta and informed him regarding bleeding.   3-New onset DM;  SSI. Will need long acting insulin when tolerating diet.   4-Hyponatremia, hypokalemia; replete with IV fluids.   ETOH withdrawal with cirrhosis and thrombocytopenia -Holding inderal due to bleeding. -Continue with CIWA.  -transaminases-- follow trend.   Injury (inflammation/mass effect) of right internal jugular vein Admitting PA discussed  with Vascular " No acute intervention needed with recommendation to utilize DVT prophylaxis level anticoagulation; VVS aware with patient with low platelets and active bleeding-discussed with attending and postoperatively consider DVT prophylactic dose i.e. 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  Disposition Plan: remain inpatient.    Consultants:   Dr Annalee Genta   Procedures:   Incision and drainage right neck abscess 1-15  Antimicrobials:  Vancomycin 1-15 Zosyn 1-15  Subjective: She is alert , in no distress. She has dressing around neck, bleeding through the dressing.  She has saturated dressing multiples times overnight.    Objective: Vitals:   07/27/17 1738 07/27/17 2200 07/28/17 0056 07/28/17 0630  BP: (!) 170/106 (!) 157/100 (!) 154/99 (!) 139/92  Pulse: (!) 120 (!) 122 (!) 118 (!) 103  Resp: (!) 22  20 20   Temp: 98.4 F (36.9 C) 99.7 F (37.6 C) 98.1 F (36.7 C) 99.8 F (37.7 C)  TempSrc: Oral Oral Oral Oral  SpO2: 97% 97% 99% 96%  Weight:    76.2 kg (167 lb 15.9 oz)  Height:        Intake/Output Summary (Last 24 hours) at 07/28/2017 0806 Last data filed at 07/28/2017 0401 Gross per 24 hour  Intake  3767.5 ml  Output 20 ml  Net 3747.5 ml   Filed Weights   07/26/17 1828 07/28/17 0630  Weight: 72.1 kg (159 lb) 76.2 kg (167 lb 15.9 oz)    Examination:  General exam: Appears calm and comfortable , neck with dressing, saturated with blood.  Respiratory system: Clear to auscultation. Respiratory effort normal. Cardiovascular system: S1 & S2 heard, RRR. No JVD,  murmurs, rubs, gallops or clicks. No pedal edema. Gastrointestinal system: Abdomen is nondistended, soft and nontender. No organomegaly or masses felt. Normal bowel sounds heard. Central nervous system: Alert and oriented. No focal neurological deficits. Extremities: Symmetric 5 x 5 power. Skin: No rashes, lesions or ulcers    Data Reviewed: I have personally reviewed following labs and imaging studies  CBC: Recent Labs  Lab 07/26/17 1920 07/27/17 0802 07/27/17 1458 07/27/17 2227 07/28/17 0520  WBC 12.5* 11.7*  --  13.9* 13.6*  NEUTROABS 10.4* 10.2*  --  12.4*  --   HGB 12.0 11.7* 11.9* 10.1* 8.4*  HCT 33.6* 33.4* 35.0* 29.6* 25.5*  MCV 79.8 81.3  --  84.3 84.7  PLT 101* 92*  --  112* 131*   Basic Metabolic Panel: Recent Labs  Lab 07/26/17 1920 07/27/17 0710 07/27/17 1458 07/28/17 0520  NA 121* 125* 132* 127*  K 3.4* 3.3* 3.5 3.4*  CL 81* 88*  --  93*  CO2 22 21*  --  19*  GLUCOSE 410* 370* 250* 248*  BUN <5* 8  --  9  CREATININE 0.42* 0.50  --  0.62  CALCIUM 7.6* 7.0*  --  6.7*   GFR: Estimated Creatinine Clearance: 98.7 mL/min (by C-G formula based on SCr of 0.62 mg/dL). Liver Function Tests: Recent Labs  Lab 07/26/17 1920 07/27/17 0710 07/28/17 0520  AST 96* 87* 86*  ALT 12* 11* 10*  ALKPHOS 243* 213* 170*  BILITOT 5.4* 6.7* 6.8*  PROT 8.7* 7.8 6.6  ALBUMIN 2.8* 2.5* 2.1*   No results for input(s): LIPASE, AMYLASE in the last 168 hours. No results for input(s): AMMONIA in the last 168 hours. Coagulation Profile: Recent Labs  Lab 07/27/17 0958  INR 1.43   Cardiac Enzymes: Recent Labs  Lab 07/27/17 0802  CKTOTAL 111   BNP (last 3 results) No results for input(s): PROBNP in the last 8760 hours. HbA1C: Recent Labs    07/27/17 0802  HGBA1C 11.5*   CBG: Recent Labs  Lab 07/27/17 1419 07/27/17 1628 07/27/17 2013 07/28/17 0106 07/28/17 0403  GLUCAP 247* 214* 289* 267* 243*   Lipid Profile: No results for input(s): CHOL, HDL, LDLCALC,  TRIG, CHOLHDL, LDLDIRECT in the last 72 hours. Thyroid Function Tests: No results for input(s): TSH, T4TOTAL, FREET4, T3FREE, THYROIDAB in the last 72 hours. Anemia Panel: No results for input(s): VITAMINB12, FOLATE, FERRITIN, TIBC, IRON, RETICCTPCT in the last 72 hours. Sepsis Labs: Recent Labs  Lab 07/27/17 0815  LATICACIDVEN 1.46    Recent Results (from the past 240 hour(s))  Aerobic/Anaerobic Culture (surgical/deep wound)     Status: None (Preliminary result)   Collection Time: 07/27/17  3:39 PM  Result Value Ref Range Status   Specimen Description ABSCESS RIGHT NECK  Final   Special Requests NONE  Final   Gram Stain   Final    NO WBC SEEN NO SQUAMOUS EPITHELIAL CELLS SEEN RARE GRAM POSITIVE COCCI IN PAIRS    Culture PENDING  Incomplete   Report Status PENDING  Incomplete         Radiology Studies: Ct Soft Tissue  Neck W Contrast  Result Date: 07/26/2017 CLINICAL DATA:  27 y/o F; large boil on right-sided neck just below mandible for 2 weeks with fever, nausea, and vomiting. EXAM: CT NECK WITH CONTRAST TECHNIQUE: Multidetector CT imaging of the neck was performed using the standard protocol following the bolus administration of intravenous contrast. CONTRAST:  75mL ISOVUE-300 IOPAMIDOL (ISOVUE-300) INJECTION 61% COMPARISON:  None. FINDINGS: Pharynx and larynx: Normal. No mass or swelling. Salivary glands: Mild edema of the parotid gland. Other salivary glands are normal. Thyroid: Normal. Lymph nodes: Right anterior and posterior cervical chain, right suboccipital, and right submandibular lymphadenopathy. No lymph node necrosis. No enlarged left-sided cervical lymph nodes. 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 Limited intracranial: Negative. Visualized orbits: Negative. Mastoids and visualized paranasal sinuses: Clear. Skeleton: No acute or aggressive process. Upper chest: Negative.  Other: Massive swelling of the right proximal sternocleidomastoid muscle with multiloculated areas of fluid attenuation spanning 6.2 x 4.7 x 5.3 cm (volume = 81 cm^3). Extensive surrounding inflammation throughout the subcutaneous fat of the right face, right anterior neck, extending across the midline below the chin. Edema in right masticator space, right parotid space, right parapharyngeal space, and right carotid space. Thickening of right platysma muscle and right superficial parotid fascia. IMPRESSION: 1. 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 Total volume approximately 81 cc. 2. Extensive inflammation throughout right masticator space, right parotid space, right parapharyngeal space, right carotid space, and throughout subcutaneous fat of right face, anterior neck, and chin. 3. 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 4. Right cervical, submandibular, and suboccipital lymphadenopathy, likely reactive. No lymph node necrosis. Electronically Signed   By: Mitzi HansenLance  Furusawa-Stratton M.D.   On: 07/26/2017 20:02        Scheduled Meds: . insulin aspart  0-9 Units Subcutaneous Q4H  . lidocaine  1 patch Transdermal Q24H  . LORazepam  0-4 mg Intravenous Q6H   Or  . LORazepam  0-4 mg Oral Q6H  . [START ON 07/29/2017] LORazepam  0-4 mg Intravenous Q12H   Or  . [START ON 07/29/2017] LORazepam  0-4 mg Oral Q12H  . metoCLOPramide (REGLAN) injection  10 mg Intravenous Once  . metoprolol tartrate  5 mg Intravenous Q8H  . ondansetron (ZOFRAN) IV  4 mg Intravenous Once  . thiamine  100 mg Oral Daily   Or  . thiamine  100 mg Intravenous Daily   Continuous Infusions: . sodium chloride    . 0.9 % NaCl with KCl 20 mEq / L 125 mL/hr at 07/27/17 2048  . piperacillin-tazobactam (ZOSYN)  IV 3.375 g (07/28/17 0537)  . vancomycin Stopped (07/27/17 2155)     LOS: 2 days    Time  spent: 35 minutes    Alba CoryBelkys A Floyd Lusignan, MD [facility] Pager [phone]  If 7PM-7AM, please contact night-coverage [email or web address] Password [email or web address] 07/28/2017, 8:06 AM

## 2017-07-28 NOTE — Op Note (Signed)
Angela Villegas, AREOLA               ACCOUNT NO.:  [id number]  MEDICAL RECORD NO.:  [id number]  LOCATION:                                 FACILITY:  PHYSICIAN:  Kinnie Scales. Annalee Genta, M.D.    DATE OF BIRTH:  DATE OF PROCEDURE:  07/27/2017 DATE OF DISCHARGE:                              OPERATIVE REPORT   LOCATION:  [facility] Main OR.  PREOPERATIVE DIAGNOSIS:  Progressive right lateral neck abscess.  POSTOPERATIVE DIAGNOSIS:  Progressive right lateral neck abscess.  INDICATION FOR SURGERY:  Progressive right lateral neck abscess.  PROCEDURE:  Incision and drainage of right lateral neck abscess.  ANESTHESIA:  General endotracheal.  SURGEON:  Kinnie Scales. Annalee Genta, MD.  COMPLICATIONS:  None.  ESTIMATED BLOOD LOSS:  Less than 50 mL.  The patient transferred from the operating room to the recovery room in stable condition.  BRIEF HISTORY:  The patient is a 27 year old female with a history of upper respiratory tract infection, which began approximately 3 weeks prior to admission.  She developed sore throat, cough, and fever with initial swelling in the right infra-auricular region.  She was seen as an outpatient and started on Levaquin oral antibiotic, which she took for approximately 10 days.  Her symptoms of respiratory infection improved, but she continued to have increasing swelling and pain in the right superior lateral neck.  The patient has a significant medical history, which includes elevated blood sugar and possible prediabetic condition as well as excessive alcohol consumption and possible early pancreatitis.  The patient's family brought her to the [facility] at [facility] on July 26, 2017, and evaluation including a CT scan of the neck showed a massive soft tissue infection with abscess surrounding the sternocleidomastoid muscle on the right.  The patient was transferred to [facility] for further medical care.  She was admitted to the medical  service for intravenous antibiotics and management of her underlying medical conditions.  ENT Service was consulted for evaluation of her neck abscess.  The patient was evaluated preoperatively and found to have erythematous swelling involving the right lateral neck.  CT scan was reviewed and showed a very large soft tissue fluid collection consistent with an abscess.  Recommended incision and drainage of the abscess under general anesthesia.  The risks and benefits were discussed with the patient and her husband and they understood and agreed with our plan for surgery, which was scheduled on an emergency basis at [facility] Main OR.  DESCRIPTION OF PROCEDURE:  The patient was brought to the operating room and placed in supine position on the operating table.  General endotracheal anesthesia was established without difficulty.  When the patient had been adequately anesthetized, a surgical time-out was performed with correct identification of the patient and the surgical procedure.  Her neck was then injected with 1 mL of 1% lidocaine with 1:100,000 dilution epinephrine was injected in subcutaneous fashion along the posterior margin of the right sternocleidomastoid muscle. After allowing adequate time for vasoconstriction hemostasis, the procedure was begun.  The patient was prepped, draped, and prepared for surgery.  A 2 cm horizontally oriented incision was created in the skin underlying subcutaneous tissue using  both blunt and sharp dissection.  The abscess cavity was palpated and then opened and a significant amount of purulent material was drained out.  Culture and sensitivity and Gram stain were taken for aerobic and anaerobic infections.  Loculations were then broken with both blunt and sharp dissection including finger dissection as well as a hemostat.  No further purulent material was identified.  A 0.5-inch Penrose drain was then placed at the depth of the incision  and sutured to the skin with a 3-0 Ethilon suture.  The patient's neck was then dressed with fluff gauze and a 4-inch Kerlix wrap.  The patient was awakened from anesthetic.  She was extubated and transferred from the operating room to the recovery room in stable condition.  There were no complications.  Estimated blood loss less than 50 mL.    ______________________________ Kinnie Scalesavid L. Annalee GentaShoemaker, M.D.   ______________________________ Kinnie Scalesavid L. Annalee GentaShoemaker, M.D.    DLS/MEDQ  D:  16/10/960401/15/2019  T:  07/27/2017  Job:  [id number]

## 2017-07-28 NOTE — Transfer of Care (Signed)
Immediate Anesthesia Transfer of Care Note  Patient: Angela Villegas  Procedure(s) Performed: exploration of right neck control of hemorrhage (Right Neck)  Patient Location: PACU  Anesthesia Type:General  Level of Consciousness: awake, alert  and patient cooperative  Airway & Oxygen Therapy: Patient Spontanous Breathing and Patient connected to face mask oxygen  Post-op Assessment: Report given to RN and Post -op Vital signs reviewed and stable  Post vital signs: Reviewed and stable  Last Vitals:  Vitals:   07/28/17 0630 07/28/17 0917  BP: (!) 139/92 123/84  Pulse: (!) 103   Resp: 20 (!) 27  Temp: 37.7 C   SpO2: 96%     Last Pain:  Vitals:   07/28/17 0800  TempSrc:   PainSc: 4       Patients Stated Pain Goal: 1 (07/28/17 0800)  Complications: No apparent anesthesia complications

## 2017-07-28 NOTE — Brief Op Note (Signed)
07/28/2017  12:35 PM  PATIENT:  Angela FurnacePreeta Villegas  27 y.o. female  PRE-OPERATIVE DIAGNOSIS:  bleeding  s/p neck surgery  POST-OPERATIVE DIAGNOSIS:  bleeding  s/p neck surgery  PROCEDURE:  Procedure(s): exploration of right neck control of hemorrhage (Right)  SURGEON:  Surgeon(s) and Role:    Osborn Coho* Zoye Chandra, MD - Primary  PHYSICIAN ASSISTANT:   ASSISTANTS: none   ANESTHESIA:   general  EBL:  50 mL   BLOOD ADMINISTERED:1 unit CC PRBC  DRAINS: Penrose drain in the Right neck   LOCAL MEDICATIONS USED:  NONE  SPECIMEN:  No Specimen  DISPOSITION OF SPECIMEN:  N/A  COUNTS:  YES  TOURNIQUET:  * No tourniquets in log *  DICTATION: .Other Dictation: Dictation Number [id number]  PLAN OF CARE: admit as inpatient  PATIENT DISPOSITION:  PACU - hemodynamically stable.   Delay start of Pharmacological VTE agent (>24hrs) due to surgical blood loss or risk of bleeding: not applicable

## 2017-07-28 NOTE — Anesthesia Postprocedure Evaluation (Signed)
Anesthesia Post Note  Patient: Angela Villegas  Procedure(s) Performed: exploration of right neck control of hemorrhage (Right Neck)     Patient location during evaluation: PACU Anesthesia Type: General Level of consciousness: awake and alert Pain management: pain level controlled Vital Signs Assessment: post-procedure vital signs reviewed and stable Respiratory status: spontaneous breathing, nonlabored ventilation and respiratory function stable Cardiovascular status: blood pressure returned to baseline and stable Postop Assessment: no apparent nausea or vomiting Anesthetic complications: no    Last Vitals:  Vitals:   07/28/17 1315 07/28/17 1321  BP:    Pulse: (!) 134   Resp: 18   Temp:  37.4 C  SpO2: 98%     Last Pain:  Vitals:   07/28/17 1417  TempSrc:   PainSc: Asleep                 Cecile HearingStephen Edward Turk

## 2017-07-28 NOTE — Anesthesia Postprocedure Evaluation (Signed)
Anesthesia Post Note  Patient: Presenter, broadcastingreeta Henshaw  Procedure(s) Performed: INCISION AND DRAINAGE ABSCESS (Right Neck)     Patient location during evaluation: PACU Anesthesia Type: General Level of consciousness: awake, sedated and lethargic Pain management: pain level controlled Vital Signs Assessment: post-procedure vital signs reviewed and stable Respiratory status: spontaneous breathing, nonlabored ventilation, respiratory function stable and patient connected to nasal cannula oxygen Cardiovascular status: blood pressure returned to baseline and stable Postop Assessment: no apparent nausea or vomiting Anesthetic complications: no    Last Vitals:  Vitals:   07/28/17 1315 07/28/17 1321  BP:    Pulse: (!) 134   Resp: 18   Temp:  37.4 C  SpO2: 98%     Last Pain:  Vitals:   07/28/17 1417  TempSrc:   PainSc: Asleep                 Azariel Banik,JAMES TERRILL

## 2017-07-28 NOTE — Progress Notes (Signed)
Craige CottaKirby, NP, paged with CBC results.

## 2017-07-29 ENCOUNTER — Encounter (HOSPITAL_COMMUNITY): Payer: Self-pay | Admitting: Otolaryngology

## 2017-07-29 LAB — TYPE AND SCREEN
ABO/RH(D): AB POS
ANTIBODY SCREEN: NEGATIVE
UNIT DIVISION: 0
UNIT DIVISION: 0

## 2017-07-29 LAB — CBC
HEMATOCRIT: 26.7 % — AB (ref 36.0–46.0)
Hemoglobin: 9 g/dL — ABNORMAL LOW (ref 12.0–15.0)
MCH: 28.8 pg (ref 26.0–34.0)
MCHC: 33.7 g/dL (ref 30.0–36.0)
MCV: 85.6 fL (ref 78.0–100.0)
Platelets: 187 10*3/uL (ref 150–400)
RBC: 3.12 MIL/uL — ABNORMAL LOW (ref 3.87–5.11)
RDW: 15 % (ref 11.5–15.5)
WBC: 10.8 10*3/uL — ABNORMAL HIGH (ref 4.0–10.5)

## 2017-07-29 LAB — GLUCOSE, CAPILLARY
GLUCOSE-CAPILLARY: 291 mg/dL — AB (ref 65–99)
GLUCOSE-CAPILLARY: 220 mg/dL — AB (ref 65–99)
Glucose-Capillary: 211 mg/dL — ABNORMAL HIGH (ref 65–99)
Glucose-Capillary: 283 mg/dL — ABNORMAL HIGH (ref 65–99)
Glucose-Capillary: 293 mg/dL — ABNORMAL HIGH (ref 65–99)
Glucose-Capillary: 351 mg/dL — ABNORMAL HIGH (ref 65–99)

## 2017-07-29 LAB — BASIC METABOLIC PANEL
Anion gap: 10 (ref 5–15)
BUN: 6 mg/dL (ref 6–20)
CALCIUM: 6.6 mg/dL — AB (ref 8.9–10.3)
CO2: 24 mmol/L (ref 22–32)
CREATININE: 0.58 mg/dL (ref 0.44–1.00)
Chloride: 96 mmol/L — ABNORMAL LOW (ref 101–111)
GFR calc non Af Amer: >60 mL/min (ref >60)
GLUCOSE: 302 mg/dL — AB (ref 65–99)
Potassium: 3.6 mmol/L (ref 3.5–5.1)
Sodium: 130 mmol/L — ABNORMAL LOW (ref 135–145)

## 2017-07-29 LAB — BPAM RBC
BLOOD PRODUCT EXPIRATION DATE: 201902192359
BLOOD PRODUCT EXPIRATION DATE: 201902212359
ISSUE DATE / TIME: 201901161124
ISSUE DATE / TIME: 201901161500
UNIT TYPE AND RH: 8400
Unit Type and Rh: 8400

## 2017-07-29 MED ORDER — INSULIN STARTER KIT- PEN NEEDLES (ENGLISH)
1.0000 | Freq: Once | Status: AC
  Administered 2017-07-29: 1
  Filled 2017-07-29: qty 1

## 2017-07-29 MED ORDER — LORAZEPAM 1 MG PO TABS: 0.0000 mg | ORAL_TABLET | Freq: Four times a day (QID) | ORAL | Status: DC

## 2017-07-29 MED ORDER — SODIUM CHLORIDE 0.9 % IV SOLN
INTRAVENOUS | Status: DC
  Administered 2017-07-29 – 2017-07-31 (×5): via INTRAVENOUS

## 2017-07-29 MED ORDER — LORAZEPAM 0.5 MG PO TABS
0.5000 mg | ORAL_TABLET | Freq: Once | ORAL | Status: AC
  Administered 2017-07-29: 0.5 mg via ORAL
  Filled 2017-07-29: qty 1

## 2017-07-29 MED ORDER — INSULIN GLARGINE 100 UNIT/ML ~~LOC~~ SOLN
20.0000 [IU] | Freq: Every day | SUBCUTANEOUS | Status: DC
  Administered 2017-07-29 – 2017-07-30 (×2): 20 [IU] via SUBCUTANEOUS
  Filled 2017-07-29 (×3): qty 0.2

## 2017-07-29 MED ORDER — SERTRALINE HCL 100 MG PO TABS
100.0000 mg | ORAL_TABLET | Freq: Every day | ORAL | Status: DC
  Administered 2017-07-29 – 2017-08-01 (×4): 100 mg via ORAL
  Filled 2017-07-29 (×4): qty 1

## 2017-07-29 MED ORDER — LORAZEPAM 2 MG/ML IJ SOLN
0.0000 mg | Freq: Four times a day (QID) | INTRAMUSCULAR | Status: DC
  Administered 2017-07-30: 1 mg via INTRAVENOUS
  Administered 2017-08-01: 2 mg via INTRAVENOUS
  Filled 2017-07-29 (×3): qty 1

## 2017-07-29 MED ORDER — FERROUS SULFATE 325 (65 FE) MG PO TABS
325.0000 mg | ORAL_TABLET | Freq: Two times a day (BID) | ORAL | Status: DC
  Administered 2017-07-29 – 2017-08-01 (×6): 325 mg via ORAL
  Filled 2017-07-29 (×6): qty 1

## 2017-07-29 MED ORDER — LORAZEPAM 2 MG/ML IJ SOLN
2.0000 mg | Freq: Once | INTRAMUSCULAR | Status: AC
  Administered 2017-07-29: 2 mg via INTRAVENOUS
  Filled 2017-07-29: qty 1

## 2017-07-29 MED ORDER — CEFAZOLIN SODIUM-DEXTROSE 1-4 GM/50ML-% IV SOLN
1.0000 g | Freq: Three times a day (TID) | INTRAVENOUS | Status: DC
  Administered 2017-07-29 – 2017-07-30 (×3): 1 g via INTRAVENOUS
  Filled 2017-07-29 (×4): qty 50

## 2017-07-29 MED ORDER — LIVING WELL WITH DIABETES BOOK
Freq: Once | Status: AC
Start: 2017-07-29 — End: 2017-07-29
  Administered 2017-07-29: 12:00:00
  Filled 2017-07-29: qty 1

## 2017-07-29 MED ORDER — LIVING WELL WITH DIABETES BOOK
Freq: Once | Status: DC
  Filled 2017-07-29: qty 1

## 2017-07-29 NOTE — Progress Notes (Signed)
   ENT Progress Note: POD #1 s/p Procedure(s): exploration of right neck control of hemorrhage   Subjective: Feeling better, tol po  Objective: Vital signs in last 24 hours: Temp:  [97.5 F (36.4 C)-101.1 F (38.4 C)] 98.4 F (36.9 C) (01/17 0409) Pulse Rate:  [91-139] 91 (01/16 2041) Resp:  [12-20] 12 (01/16 2041) BP: (114-144)/(70-97) 122/87 (01/17 0222) SpO2:  [95 %-100 %] 97 % (01/16 2041) Weight:  [76.3 kg (168 lb 4.8 oz)] 76.3 kg (168 lb 4.8 oz) (01/17 0415) Weight change: 0.14 kg (5 oz) Last BM Date: 07/28/17  Intake/Output from previous day: 01/16 0701 - 01/17 0700 In: 3818.9 [P.O.:120; I.V.:2738.9; Blood:660; IV Piggyback:300] Out: 50 [Blood:50] Intake/Output this shift: Total I/O In: 120 [P.O.:120] Out: -   Labs: Recent Labs    07/28/17 2036 07/29/17 0742  WBC 11.3* 10.8*  HGB 9.2* 9.0*  HCT 27.4* 26.7*  PLT 146* 187   Recent Labs    07/28/17 0520 07/28/17 1125 07/29/17 0742  NA 127* 129* 130*  K 3.4* 3.4* 3.6  CL 93*  --  96*  CO2 19*  --  24  GLUCOSE 248* 238* 302*  BUN 9  --  6  CALCIUM 6.7*  --  6.6*    Studies/Results: No results found.   PHYSICAL EXAM: Incision intact - min d/c, no active bleeding Cont induration of soft tissues   Assessment/Plan: Pt improving Min d/c 24 hrs after neck exploration Falling WBC and fever C/S pending Will follow    Katalina Magri 07/29/2017, 9:37 AM

## 2017-07-29 NOTE — Progress Notes (Signed)
RD consulted to provide diabetes education. Patient sleeping soundly during visit. Family member requests RD return at later date. Will follow-up.  Dionne AnoWilliam M. Lashae Wollenberg, MS, RD LDN Inpatient Clinical Dietitian Pager [phone]

## 2017-07-29 NOTE — Progress Notes (Signed)
Inpatient Diabetes Program Recommendations  AACE/ADA: New Consensus Statement on Inpatient Glycemic Control (2015)  Target Ranges:  Prepandial:   less than 140 mg/dL      Peak postprandial:   less than 180 mg/dL (1-2 hours)      Critically ill patients:  140 - 180 mg/dL   Lab Results  Component Value Date   GLUCAP 351 (H) 07/29/2017   HGBA1C 11.5 (H) 07/27/2017    Review of Glycemic ControlResults for Angela FurnaceSHAIKH, Hema (MRN [id number]) as of 07/29/2017 13:10  Ref. Range 07/28/2017 20:16 07/29/2017 00:18 07/29/2017 04:09 07/29/2017 08:05 07/29/2017 11:59  Glucose-Capillary Latest Ref Range: 65 - 99 mg/dL 409377 (H) 811293 (H) 914291 (H) 283 (H) 351 (H)  Diabetes history: New onset DM Outpatient Diabetes medications: None Current orders for Inpatient glycemic control:  Novolog sensitive q 4 hours, Lantus 20 units daily  Inpatient Diabetes Program Recommendations:    Spoke with patient regarding new diagnosis of diabetes.  We discussed what diabetes is, complications, symptoms of high and low blood sugars, and hypoglycemia treatment.  Patient states that she drinks lots of juice a long with alcohol.  We discussed elimination of juices and sweetened beverages in order to control blood sugars.  She also states that she was drinking prior to admission and plans to stop.  She has a Veterinary surgeoncounselor for addiction that she plans to see.  Briefly discussed that she will likely need insulin for DM management at this time due to liver and cirrhosis.  I explained that she will need to practice insulin administration, testing CBG's, and DM care while in the hospital.  She does have a PCP.  She will need to follow- up with PCP within a week.  We discussed A1C results and goal A1c and blood sugars.  Gave her handout regarding current A1C and standards of care.  Will have Diabetes Coordinator follow-up with patient on 1/18.  Likely will be candidate for insulin pen for insulin administration.  Will also order DM teaching by bedside  RN including DM videos, insulin administration, Living Well with DM booklet, etc.   Thanks,  Beryl MeagerJenny Rosamary Boudreau, RN, BC-ADM Inpatient Diabetes Coordinator Pager [phone] (8a-5p)

## 2017-07-29 NOTE — Progress Notes (Signed)
PROGRESS NOTE    Angela Villegas  ZDG:[id number] DOB: 1990-07-14 DOA: 07/26/2017 PCP: Maurice Small, MD    Brief Narrative: Angela Villegas is a 27 y.o. female with a Past Medical History of alcoholic cirrhosis; HTN; and ADHD who presents with right jaw/facial cellulitis.  Patient presented to [facility] last evening for failure of outpatient therapy (Levaquin).  Patient with elevated glucose to 410, anion gap of 18, but no apparent acidosis.  CT was concerning for a large loculated abscess and Dr. Redmond Baseman recommended transfer to [facility].  She was given Vanc and Zosyn and placed on CIWA protocol.  She eventually had an ER to ER transfer.  She underwent incision and drainage of right neck abscess 1-15. Overnight she has been bleeding at incision site. 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  Continue with IV antibiotics. Change to Ancef. Culture grew staph Aureus pan sensitive.  Leukocytosis trending down.    2-Acute blood loss anemia. Post operative hemorrhage.  Bleeding from surgical site.  Received 2 units PRBC>  She was taken to OR for cauterization of bleeding vessels.  Hb now stable.   3-New onset DM;  SSI.  Start lantus 20 units. SSI.  HB A1c at 11.   4-Hyponatremia, hypokalemia; replete with IV fluids.   ETOH withdrawal with cirrhosis and thrombocytopenia -Holding inderal due to bleeding. -Continue with CIWA.  -transaminases-- follow trend.   Injury (inflammation/mass effect) of right internal jugular vein Admitting PA discussed with Vascular " No  acute intervention needed with recommendation to utilize DVT prophylaxis level anticoagulation; VVS aware with patient with low platelets and active bleeding-discussed with attending and postoperatively consider DVT prophylactic dose i.e. 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  Disposition Plan: remain inpatient.    Consultants:   Dr Wilburn Cornelia   Procedures:   Incision and drainage right neck abscess 1-15  Antimicrobials:  Vancomycin 1-15 Zosyn 1-15  Subjective: She is feeling better, still with a lot of neck pain.  No further bleeding from neck    Objective: Vitals:   07/29/17 0019 07/29/17 0222 07/29/17 0409 07/29/17 0415  BP:  122/87    Pulse:      Resp:      Temp: 99 F (37.2 C)  98.4 F (36.9 C)   TempSrc: Oral  Oral   SpO2:      Weight:    76.3 kg (168 lb 4.8 oz)  Height:        Intake/Output Summary (Last 24 hours) at 07/29/2017 1335 Last data filed at 07/29/2017 0927 Gross per 24 hour  Intake 2153.92 ml  Output -  Net 2153.92 ml   Filed Weights   07/26/17 1828 07/28/17 0630 07/29/17 0415  Weight:  72.1 kg (159 lb) 76.2 kg (167 lb 15.9 oz) 76.3 kg (168 lb 4.8 oz)    Examination:  General exam: NAD, clean dressing right side neck  Respiratory system: CTA Cardiovascular system: S 1 S 2 RRR Gastrointestinal system: BS present, soft, nt Central nervous system: alert  Extremities no edema     Data Reviewed: I have personally reviewed following labs and imaging studies  CBC: Recent Labs  Lab 07/26/17 1920 07/27/17 0802  07/27/17 2227 07/28/17 0520  07/28/17 1125 07/28/17 2036 07/29/17 0742  WBC 12.5* 11.7*  --  13.9* 13.6*  --  11.3* 10.8*  NEUTROABS 10.4* 10.2*  --  12.4*  --   --   --   --   HGB 12.0 11.7*   < > 10.1* 8.4* 7.5* 9.2* 9.0*  HCT 33.6* 33.4*   < > 29.6* 25.5* 22.0* 27.4* 26.7*  MCV 79.8 81.3  --  84.3 84.7  --  85.9 85.6  PLT 101* 92*  --  112* 131*  --  146* 187   < > = values in this interval not displayed.   Basic Metabolic Panel: Recent Labs  Lab 07/26/17 1920 07/27/17 0710 07/27/17 1458 07/28/17 0520 07/28/17 1125 07/29/17 0742  NA 121* 125* 132* 127* 129* 130*  K 3.4* 3.3* 3.5 3.4* 3.4* 3.6  CL 81* 88*  --  93*  --  96*  CO2 22 21*  --  19*  --  24  GLUCOSE 410* 370* 250* 248* 238* 302*  BUN <5* 8  --  9  --  6  CREATININE 0.42* 0.50  --  0.62  --  0.58  CALCIUM 7.6* 7.0*  --  6.7*  --  6.6*   GFR: Estimated Creatinine Clearance: 98.7 mL/min (by C-G formula based on SCr of 0.58 mg/dL). Liver Function Tests: Recent Labs  Lab 07/26/17 1920 07/27/17 0710 07/28/17 0520  AST 96* 87* 86*  ALT 12* 11* 10*  ALKPHOS 243* 213* 170*  BILITOT 5.4* 6.7* 6.8*  PROT 8.7* 7.8 6.6  ALBUMIN 2.8* 2.5* 2.1*   No results for input(s): LIPASE, AMYLASE in the last 168 hours. No results for input(s): AMMONIA in the last 168 hours. Coagulation Profile: Recent Labs  Lab 07/27/17 0958 07/28/17 0901  INR 1.43 1.53   Cardiac Enzymes: Recent Labs  Lab 07/27/17 0802  CKTOTAL 111   BNP (last 3 results) No results for input(s): PROBNP in the last 8760 hours. HbA1C: Recent Labs    07/27/17 0802  HGBA1C 11.5*   CBG: Recent Labs  Lab 07/28/17 2016 07/29/17 0018 07/29/17 0409 07/29/17 0805 07/29/17 1159  GLUCAP 377* 293* 291* 283* 351*   Lipid Profile: No results for input(s): CHOL, HDL, LDLCALC, TRIG, CHOLHDL, LDLDIRECT in the last 72 hours. Thyroid Function Tests: No results for input(s): TSH, T4TOTAL, FREET4, T3FREE, THYROIDAB in the last 72 hours. Anemia Panel: No results for input(s):  VITAMINB12, FOLATE, FERRITIN, TIBC, IRON, RETICCTPCT in the last 72 hours. Sepsis Labs: Recent Labs  Lab 07/27/17 0815  LATICACIDVEN 1.46    Recent Results (from the past 240 hour(s))  Culture, blood (Routine X 2) w Reflex to ID Panel     Status: None (Preliminary result)   Collection Time: 07/27/17 11:45 AM  Result Value Ref Range Status   Specimen Description BLOOD RIGHT ANTECUBITAL  Final   Special Requests   Final    BOTTLES DRAWN AEROBIC AND ANAEROBIC Blood Culture adequate volume   Culture NO GROWTH 2 DAYS  Final  Report Status PENDING  Incomplete  Culture, blood (Routine X 2) w Reflex to ID Panel     Status: None (Preliminary result)   Collection Time: 07/27/17 12:00 PM  Result Value Ref Range Status   Specimen Description BLOOD RIGHT HAND  Final   Special Requests   Final    BOTTLES DRAWN AEROBIC AND ANAEROBIC Blood Culture adequate volume   Culture NO GROWTH 2 DAYS  Final   Report Status PENDING  Incomplete  Aerobic/Anaerobic Culture (surgical/deep wound)     Status: None (Preliminary result)   Collection Time: 07/27/17  3:39 PM  Result Value Ref Range Status   Specimen Description ABSCESS RIGHT NECK  Final   Special Requests NONE  Final   Gram Stain   Final    NO WBC SEEN NO SQUAMOUS EPITHELIAL CELLS SEEN RARE GRAM POSITIVE COCCI IN PAIRS    Culture FEW STAPHYLOCOCCUS AUREUS  Final   Report Status PENDING  Incomplete   Organism ID, Bacteria STAPHYLOCOCCUS AUREUS  Final      Susceptibility   Staphylococcus aureus - MIC*    CIPROFLOXACIN >=8 RESISTANT Resistant     ERYTHROMYCIN <=0.25 SENSITIVE Sensitive     GENTAMICIN <=0.5 SENSITIVE Sensitive     OXACILLIN 0.5 SENSITIVE Sensitive     TETRACYCLINE <=1 SENSITIVE Sensitive     VANCOMYCIN <=0.5 SENSITIVE Sensitive     TRIMETH/SULFA <=10 SENSITIVE Sensitive     CLINDAMYCIN <=0.25 SENSITIVE Sensitive     RIFAMPIN <=0.5 SENSITIVE Sensitive     Inducible Clindamycin NEGATIVE Sensitive     * FEW STAPHYLOCOCCUS  AUREUS  MRSA PCR Screening     Status: None   Collection Time: 07/28/17  7:41 AM  Result Value Ref Range Status   MRSA by PCR NEGATIVE NEGATIVE Final    Comment:        The GeneXpert MRSA Assay (FDA approved for NASAL specimens only), is one component of a comprehensive MRSA colonization surveillance program. It is not intended to diagnose MRSA infection nor to guide or monitor treatment for MRSA infections.          Radiology Studies: No results found.      Scheduled Meds: . bacitracin   Topical BID  . ferrous sulfate  325 mg Oral BID WC  . insulin aspart  0-9 Units Subcutaneous Q4H  . insulin glargine  20 Units Subcutaneous Daily  . insulin starter kit- pen needles  1 kit Other Once  . lidocaine  1 patch Transdermal Q24H  . living well with diabetes book   Does not apply Once  . LORazepam  0-4 mg Intravenous Q12H   Or  . LORazepam  0-4 mg Oral Q12H  . metoCLOPramide (REGLAN) injection  10 mg Intravenous Once  . ondansetron (ZOFRAN) IV  4 mg Intravenous Once  . sertraline  100 mg Oral Daily  . thiamine  100 mg Oral Daily   Or  . thiamine  100 mg Intravenous Daily   Continuous Infusions: . sodium chloride 75 mL/hr at 07/29/17 1204  .  ceFAZolin (ANCEF) IV 1 g (07/29/17 1320)     LOS: 3 days    Time spent: 35 minutes    Elmarie Shiley, MD [facility] Pager [phone]  If 7PM-7AM, please contact night-coverage [email or web address] Password [email or web address] 07/29/2017, 1:35 PM

## 2017-07-29 NOTE — Op Note (Signed)
NAME:  Angela Villegas, MOL NO.:  MEDICAL RECORD NO.:  [id number]  LOCATION:                                 FACILITY:  PHYSICIAN:  Kinnie Scales. Annalee Genta, M.D.    DATE OF BIRTH:  DATE OF PROCEDURE:  07/28/2017 DATE OF DISCHARGE:                              OPERATIVE REPORT   LOCATION:  [facility] Main OR.  PREOPERATIVE DIAGNOSES: 1. Status post incision and drainage of right neck deep abscess on     July 27, 2017. 2. Postoperative hemorrhage.  POSTOPERATIVE DIAGNOSES: 1. Status post incision and drainage of right neck deep abscess on     July 27, 2017. 2. Postoperative hemorrhage.  SURGICAL PROCEDURE:  Exploration right neck with ligation of external jugular vein and other bleeding sources with evacuation of hematoma.  ANESTHESIA:  General endotracheal.  SURGEON:  Kinnie Scales. Annalee Genta, MD.  COMPLICATIONS:  None.  ESTIMATED INTRAOPERATIVE BLOOD LOSS:  Less than 50 mL.  The patient transferred from the operating room to the recovery room in stable condition.  BRIEF HISTORY:  The patient is a 27 year old female. Who was admitted to the hospital service with right neck abscess.  The patient had a 3-week history of progressive symptoms of upper respiratory tract infection, was treated with oral antibiotics and developed swelling in the right neck.  The patient has other significant medical issues, which include alcohol abuse, early pancreatitis, and elevated blood sugar with possible prediabetes.  The patient's platelets were low and she may have been mildly coagulopathic.  She underwent her incision and drainage for a significant amount of purulent material from the right neck on July 27, 2017.  Later that evening, the patient developed slow oozing from her neck incision.  By the following morning had dropped her hemoglobin from 11 to 8.  Given those findings, I recommended return to the operating room for examination under anesthesia  and control of hemorrhage site.  Risks and benefits were discussed in detail with the patient and her husband.  They understood and agree with our plan for surgery.  DESCRIPTION OF PROCEDURE:  The patient was brought to the operating room on July 28, 2017, and placed in supine position on the operating table.  General endotracheal anesthesia was established without difficulty.  A surgical time-out was then performed with correct identification of the patient and the surgical procedure including the right laterality.  The patient was then positioned and prepped and draped.  With the patient prepared for surgery, the previously placed Penrose drain was removed and the incision was extended anteriorly approximately 2 cm in a pre-existing skin crease.  Soft tissue was elevated.  There were significant swelling and induration of the soft tissues. Dissection was carried out with blunt and sharp dissection and Bovie electrocautery.  The posterior border of the sternocleidomastoid muscle was identified and this was reflected anteriorly.  There was an organized hematoma within the previous abscess cavity, which was cleared.  In order to allow better visualization, the incision was extended further anteriorly for a total of approximately 8 cm incision. Skin and overlying soft tissue were carefully dissected.  Blood vessels were divided and  suture ligated.  The inferior border of the parotid gland was elevated superiorly and dissection was carried along the posterior aspect of the sternocleidomastoid muscle.  The previously explored abscess cavity was identified and then carefully cleared of clotted material.  There were several small blood vessels that were bleeding and these were divided and suture ligated.  The jugular vein and neurovascular bundle were anterior to the dissection.  These were not specifically identified.  The external jugular vein along the anterior component of the  incision, this was divided and suture ligated. The patient underwent Valsalva.  There was no active bleeding.  The wound was again thoroughly irrigated with sterile saline.  No further bleeding noted.  A small bit of oozing at the skin edge.  A half-inch Penrose was placed at the base of the abscess cavity and then the incision was closed in interrupted fashion with 3-0 and 4-0 Vicryl and final skin edge closure consisting of surgical staples.  The wound was then dressed with bacitracin ointment, 4x4 gauze, and a Kerlix wrap. The patient was awakened from anesthetic.  She was extubated and transferred from the operating room to the recovery room in stable condition.  There were no complications.  Estimated blood loss was approximately 50 mL.          ______________________________ Kinnie Scalesavid L. Annalee GentaShoemaker, M.D.     DLS/MEDQ  D:  96/04/540901/16/2019  T:  07/28/2017  Job:  [id number]

## 2017-07-30 LAB — GLUCOSE, CAPILLARY
GLUCOSE-CAPILLARY: 126 mg/dL — AB (ref 65–99)
GLUCOSE-CAPILLARY: 158 mg/dL — AB (ref 65–99)
Glucose-Capillary: 118 mg/dL — ABNORMAL HIGH (ref 65–99)
Glucose-Capillary: 149 mg/dL — ABNORMAL HIGH (ref 65–99)
Glucose-Capillary: 182 mg/dL — ABNORMAL HIGH (ref 65–99)
Glucose-Capillary: 209 mg/dL — ABNORMAL HIGH (ref 65–99)

## 2017-07-30 LAB — HEMOGLOBIN AND HEMATOCRIT, BLOOD
HCT: 27.2 % — ABNORMAL LOW (ref 36.0–46.0)
HEMOGLOBIN: 9.2 g/dL — AB (ref 12.0–15.0)

## 2017-07-30 MED ORDER — SULFAMETHOXAZOLE-TRIMETHOPRIM 400-80 MG PO TABS
1.0000 | ORAL_TABLET | Freq: Two times a day (BID) | ORAL | Status: DC
  Administered 2017-07-30 – 2017-07-31 (×3): 1 via ORAL
  Filled 2017-07-30 (×3): qty 1

## 2017-07-30 NOTE — Progress Notes (Signed)
Patient was educated on how to give insulin and performed it on her self with the lantus. She did a great job and had proper technique and also alcohol wiped her skin before.

## 2017-07-30 NOTE — Progress Notes (Signed)
Dressing on neck has been changed there was noticeable bleeding on previous dressing.

## 2017-07-30 NOTE — Progress Notes (Signed)
Dressing on neck was changed and the guaze showed a good amount of drainage. This is the third time the dressing was changed today.

## 2017-07-30 NOTE — Progress Notes (Signed)
Inpatient Diabetes Program Recommendations  AACE/ADA: New Consensus Statement on Inpatient Glycemic Control (2015)  Target Ranges:  Prepandial:   less than 140 mg/dL      Peak postprandial:   less than 180 mg/dL (1-2 hours)      Critically ill patients:  140 - 180 mg/dL   Reviewed insulin pen with patient. Patient has questions over what types of insulin and how many times a day she would need insulin. I went over the different types of insulin and why we give them when. Spoke to patient about why she would be on insulin versus insulin and oral medications. Will defer to MD and discharge instructions for further education on insulins and when to give.  Thanks,  Christena DeemShannon Aluel Schwarz RN, MSN, [facility] Inpatient Diabetes Coordinator Team Pager [phone] (8a-5p)

## 2017-07-30 NOTE — Plan of Care (Signed)
Nutrition Education Note   RD consulted for nutrition education regarding newly diagnosed diabetes.   Lab Results  Component Value Date   HGBA1C 11.5 (H) 07/27/2017   Spoke with pt and husband at bedside. Reviewed pt's dietary recall. Pt's husband cooks often but lately they have only been eating fast food. Pt reports she only consumes one large meal and snack per day. Discussed importance of controlled and consistent carbohydrate intake throughout the day. Pt reports consuming an abundance of fruit juice and alcohol at home. Pt reports she plans to discontinue alcohol use upon discharge. Discussed the difference between fruit juices and actual whole fruits. Recommended decreasing sweetened beverage consumption.   RD provided "Carbohydrate Counting for People with Diabetes" handout from the Academy of Nutrition and Dietetics. Discussed different food groups and their effects on blood sugar, emphasizing carbohydrate-containing foods. Provided list of carbohydrates and recommended serving sizes of common foods.  Provided examples of ways to balance meals/snacks and encouraged intake of high-fiber, whole grain complex carbohydrates. Teach back method used.  Expect fair compliance.  Body mass index is 31.91 kg/m. Pt meets criteria for obese based on current BMI.  Current diet order was advanced to soft, patient is consuming approximately 100% of meals at this time. Labs and medications reviewed. No further nutrition interventions warranted at this time. RD contact information provided. If additional nutrition issues arise, please re-consult RD.  Fransisca KaufmannAllison Ioannides, MS, RDN, LDN 07/30/2017 1:18 PM

## 2017-07-30 NOTE — Plan of Care (Signed)
Progressing

## 2017-07-30 NOTE — Progress Notes (Signed)
   ENT Progress Note: POD #2 s/p Procedure(s): exploration of right neck control of hemorrhage   Subjective: Patient stable, some bloody d/c  Objective: Vital signs in last 24 hours: Temp:  [98.3 F (36.8 C)-98.7 F (37.1 C)] 98.7 F (37.1 C) (01/18 0427) Pulse Rate:  [77-95] 95 (01/18 0427) Resp:  [16-18] 18 (01/18 0427) BP: (120-128)/(82-94) 120/82 (01/18 0427) SpO2:  [97 %-100 %] 97 % (01/18 0427) Weight:  [76.6 kg (168 lb 12.8 oz)] 76.6 kg (168 lb 12.8 oz) (01/18 0542) Weight change: 0.227 kg (8 oz) Last BM Date: 07/29/17  Intake/Output from previous day: 01/17 0701 - 01/18 0700 In: 1870 [P.O.:600; I.V.:1120; IV Piggyback:150] Out: -  Intake/Output this shift: Total I/O In: 222 [P.O.:222] Out: -   Labs: Recent Labs    07/28/17 2036 07/29/17 0742  WBC 11.3* 10.8*  HGB 9.2* 9.0*  HCT 27.4* 26.7*  PLT 146* 187   Recent Labs    07/28/17 0520 07/28/17 1125 07/29/17 0742  NA 127* 129* 130*  K 3.4* 3.4* 3.6  CL 93*  --  96*  CO2 19*  --  24  GLUCOSE 248* 238* 302*  BUN 9  --  6  CALCIUM 6.7*  --  6.6*    Studies/Results: No results found.   PHYSICAL EXAM: Some bloody d/c, no purulent drainage Cont soft tissue swelling   Assessment/Plan: Pt improving (afebrile and decreasing WBC) Some cont d/c Recommend cont IV abx, may convert to po's before d/c Will ask ENT on call to see pt Sat and remove JP drain. Plan outpt f/u for staple removal in ~1 wk    Yanelli Zapanta 07/30/2017, 9:49 AM

## 2017-07-30 NOTE — Progress Notes (Signed)
CSW attempted to see patient for resources but patient was asleep and family member at bedside. CSW will attempt again as time allows.    Osborne Cascoadia Vester Balthazor LCSW [phone]

## 2017-07-30 NOTE — Progress Notes (Signed)
MD was paged about patient vomiting up zoloft and thiamine this morning and she is requesting another zoloft. I did not see the patient vomiting, it happened after I left  and they stated that the environmental service already took out her trash. So I did not see actual vomit from the patient.

## 2017-07-30 NOTE — Progress Notes (Signed)
Dressing was changed and previous one was soiled.

## 2017-07-30 NOTE — Progress Notes (Signed)
PROGRESS NOTE    Angela Villegas  ZOX:[id number] DOB: June 04, 1991 DOA: 07/26/2017 PCP: Shirlean MylarWebb, Carol, MD    Brief Narrative: Angela Villegas is a 27 y.o. female with a Past Medical History of alcoholic cirrhosis; HTN; and ADHD who presents with right jaw/facial cellulitis.  [facility] to [facility] last evening for failure of outpatient therapy (Levaquin).  Patient with elevated glucose to 410, anion gap of 18, but no apparent acidosis.  CT was concerning for a large loculated abscess and Dr. Jenne PaneBates [facility].  She was given Vanc and Zosyn and placed on CIWA protocol.  She eventually had an ER to ER transfer.  She underwent incision and drainage of right neck abscess 1-15. Overnight she has been bleeding at incision site. 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  Continue with IV antibiotics. On Ancef. Culture grew staph Aureus pan sensitive.  Leukocytosis trending down.  Change to oral bactrim to make sure patient is able to tolerates oral antibiotics.  Plan to have drain removed tomorrow.   2-Acute blood loss anemia. Post operative hemorrhage.  Bleeding from surgical site.  Received 2 units PRBC>  She was taken to OR for cauterization of bleeding vessels.  Hb now stable.   3-New onset DM;  SSI.  Start lantus 20 units. SSI.  HB A1c at 11.   4-Hyponatremia, hypokalemia; replete with IV fluids.   ETOH withdrawal with cirrhosis and thrombocytopenia -Holding inderal due to bleeding. -Continue with CIWA.  -transaminases-- follow  trend.   Injury (inflammation/mass effect) of right internal jugular vein Admitting PA discussed with Vascular " No acute intervention needed with recommendation to utilize DVT prophylaxis level anticoagulation; VVS aware with patient with low platelets and active bleeding-discussed with attending and postoperatively consider DVT prophylactic dose i.e. 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  Disposition Plan: remain inpatient.    Consultants:   Dr Annalee GentaShoemaker   Procedures:   Incision and drainage right neck abscess 1-15  Antimicrobials:  Vancomycin 1-15 Zosyn 1-15  Subjective: Vomited some pills this am.  Denies abdominal pain    Objective: Vitals:   07/29/17 2237 07/30/17 0427 07/30/17 0542 07/30/17 1215  BP: (!) 125/93 120/82  116/78  Pulse: 77 95  (!) 107  Resp: 18 18    Temp:  98.7 F (37.1 C)    TempSrc:  Oral    SpO2: 98% 97%    Weight:   76.6 kg (168 lb 12.8 oz)   Height:        Intake/Output Summary (Last 24 hours) at 07/30/2017 1342 Last data filed at 07/30/2017 0900 Gross per 24 hour  Intake 1782 ml  Output -  Net 1782 ml   Ceasar MonsFiled  Weights   07/28/17 0630 07/29/17 0415 07/30/17 0542  Weight: 76.2 kg (167 lb 15.9 oz) 76.3 kg (168 lb 4.8 oz) 76.6 kg (168 lb 12.8 oz)    Examination:  General exam: NAD, dressing right side neck Respiratory system: CTA Cardiovascular system: S 1, S 2 RRR Gastrointestinal system: BS present, soft,.  Central nervous system: alert  Extremities no edema     Data Reviewed: I have personally reviewed following labs and imaging  studies  CBC: Recent Labs  Lab 07/26/17 1920 07/27/17 0802  07/27/17 2227 07/28/17 0520 07/28/17 1125 07/28/17 2036 07/29/17 0742 07/30/17 0936  WBC 12.5* 11.7*  --  13.9* 13.6*  --  11.3* 10.8*  --   NEUTROABS 10.4* 10.2*  --  12.4*  --   --   --   --   --   HGB 12.0 11.7*   < > 10.1* 8.4* 7.5* 9.2* 9.0* 9.2*  HCT 33.6* 33.4*   < > 29.6* 25.5* 22.0* 27.4* 26.7* 27.2*  MCV 79.8 81.3  --  84.3 84.7  --  85.9 85.6  --   PLT 101* 92*  --  112* 131*  --  146* 187  --    < > = values in this interval not displayed.   Basic Metabolic Panel: Recent Labs  Lab 07/26/17 1920 07/27/17 0710 07/27/17 1458 07/28/17 0520 07/28/17 1125 07/29/17 0742  NA 121* 125* 132* 127* 129* 130*  K 3.4* 3.3* 3.5 3.4* 3.4* 3.6  CL 81* 88*  --  93*  --  96*  CO2 22 21*  --  19*  --  24  GLUCOSE 410* 370* 250* 248* 238* 302*  BUN <5* 8  --  9  --  6  CREATININE 0.42* 0.50  --  0.62  --  0.58  CALCIUM 7.6* 7.0*  --  6.7*  --  6.6*   GFR: Estimated Creatinine Clearance: 98.9 mL/min (by C-G formula based on SCr of 0.58 mg/dL). Liver Function Tests: Recent Labs  Lab 07/26/17 1920 07/27/17 0710 07/28/17 0520  AST 96* 87* 86*  ALT 12* 11* 10*  ALKPHOS 243* 213* 170*  BILITOT 5.4* 6.7* 6.8*  PROT 8.7* 7.8 6.6  ALBUMIN 2.8* 2.5* 2.1*   No results for input(s): LIPASE, AMYLASE in the last 168 hours. No results for input(s): AMMONIA in the last 168 hours. Coagulation Profile: Recent Labs  Lab 07/27/17 0958 07/28/17 0901  INR 1.43 1.53   Cardiac Enzymes: Recent Labs  Lab 07/27/17 0802  CKTOTAL 111   BNP (last 3 results) No results for input(s): PROBNP in the last 8760 hours. HbA1C: No results for input(s): HGBA1C in the last 72 hours. CBG: Recent Labs  Lab 07/29/17 2009 07/30/17 0010 07/30/17 0425 07/30/17 0738 07/30/17 1206  GLUCAP 220* 209* 118* 126* 158*   Lipid Profile: No results for input(s): CHOL, HDL, LDLCALC, TRIG, CHOLHDL, LDLDIRECT in the last 72 hours. Thyroid  Function Tests: No results for input(s): TSH, T4TOTAL, FREET4, T3FREE, THYROIDAB in the last 72 hours. Anemia Panel: No results for input(s): VITAMINB12, FOLATE, FERRITIN, TIBC, IRON, RETICCTPCT in the last 72 hours. Sepsis Labs: Recent Labs  Lab 07/27/17 0815  LATICACIDVEN 1.46    Recent Results (from the past 240 hour(s))  Culture, blood (Routine X 2) w Reflex to ID Panel     Status: None (Preliminary result)   Collection Time: 07/27/17 11:45 AM  Result Value Ref Range Status   Specimen Description BLOOD RIGHT ANTECUBITAL  Final   Special  Requests   Final    BOTTLES DRAWN AEROBIC AND ANAEROBIC Blood Culture adequate volume   Culture NO GROWTH 3 DAYS  Final   Report Status PENDING  Incomplete  Culture, blood (Routine X 2) w Reflex to ID Panel     Status: None (Preliminary result)   Collection Time: 07/27/17 12:00 PM  Result Value Ref Range Status   Specimen Description BLOOD RIGHT HAND  Final   Special Requests   Final    BOTTLES DRAWN AEROBIC AND ANAEROBIC Blood Culture adequate volume   Culture NO GROWTH 3 DAYS  Final   Report Status PENDING  Incomplete  Aerobic/Anaerobic Culture (surgical/deep wound)     Status: None (Preliminary result)   Collection Time: 07/27/17  3:39 PM  Result Value Ref Range Status   Specimen Description ABSCESS RIGHT NECK  Final   Special Requests NONE  Final   Gram Stain   Final    NO WBC SEEN NO SQUAMOUS EPITHELIAL CELLS SEEN RARE GRAM POSITIVE COCCI IN PAIRS    Culture   Final    FEW STAPHYLOCOCCUS AUREUS NO ANAEROBES ISOLATED; CULTURE IN PROGRESS FOR 5 DAYS    Report Status PENDING  Incomplete   Organism ID, Bacteria STAPHYLOCOCCUS AUREUS  Final      Susceptibility   Staphylococcus aureus - MIC*    CIPROFLOXACIN >=8 RESISTANT Resistant     ERYTHROMYCIN <=0.25 SENSITIVE Sensitive     GENTAMICIN <=0.5 SENSITIVE Sensitive     OXACILLIN 0.5 SENSITIVE Sensitive     TETRACYCLINE <=1 SENSITIVE Sensitive     VANCOMYCIN <=0.5 SENSITIVE  Sensitive     TRIMETH/SULFA <=10 SENSITIVE Sensitive     CLINDAMYCIN <=0.25 SENSITIVE Sensitive     RIFAMPIN <=0.5 SENSITIVE Sensitive     Inducible Clindamycin NEGATIVE Sensitive     * FEW STAPHYLOCOCCUS AUREUS  MRSA PCR Screening     Status: None   Collection Time: 07/28/17  7:41 AM  Result Value Ref Range Status   MRSA by PCR NEGATIVE NEGATIVE Final    Comment:        The GeneXpert MRSA Assay (FDA approved for NASAL specimens only), is one component of a comprehensive MRSA colonization surveillance program. It is not intended to diagnose MRSA infection nor to guide or monitor treatment for MRSA infections.          Radiology Studies: No results found.      Scheduled Meds: . bacitracin   Topical BID  . ferrous sulfate  325 mg Oral BID WC  . insulin aspart  0-9 Units Subcutaneous Q4H  . insulin glargine  20 Units Subcutaneous Daily  . lidocaine  1 patch Transdermal Q24H  . living well with diabetes book   Does not apply Once  . LORazepam  0-4 mg Intravenous Q6H   Or  . LORazepam  0-4 mg Oral Q6H  . metoCLOPramide (REGLAN) injection  10 mg Intravenous Once  . ondansetron (ZOFRAN) IV  4 mg Intravenous Once  . sertraline  100 mg Oral Daily  . sulfamethoxazole-trimethoprim  1 tablet Oral Q12H  . thiamine  100 mg Oral Daily   Or  . thiamine  100 mg Intravenous Daily   Continuous Infusions: . sodium chloride 75 mL/hr at 07/30/17 0220     LOS: 4 days    Time spent: 35 minutes    Alba Cory, MD [facility] Pager [phone]  If 7PM-7AM, please contact night-coverage [email or web address] Password [email or web address] 07/30/2017, 1:42 PM

## 2017-07-30 NOTE — Progress Notes (Signed)
Patient vomited her pills up this morning.

## 2017-07-30 NOTE — Progress Notes (Signed)
Patient was hard to arouse to do a CIWA score on, I attempted to wake up patient and she fell back asleep. I did not give ativan due to the patient sleeping and a CIWA of 0

## 2017-07-31 LAB — BASIC METABOLIC PANEL
ANION GAP: 12 (ref 5–15)
Anion gap: 13 (ref 5–15)
BUN: <5 mg/dL — ABNORMAL LOW (ref 6–20)
BUN: <5 mg/dL — ABNORMAL LOW (ref 6–20)
CALCIUM: 6.9 mg/dL — AB (ref 8.9–10.3)
CHLORIDE: 97 mmol/L — AB (ref 101–111)
CO2: 20 mmol/L — AB (ref 22–32)
CO2: 23 mmol/L (ref 22–32)
CREATININE: 0.49 mg/dL (ref 0.44–1.00)
Calcium: 6.4 mg/dL — CL (ref 8.9–10.3)
Chloride: 99 mmol/L — ABNORMAL LOW (ref 101–111)
Creatinine, Ser: 0.51 mg/dL (ref 0.44–1.00)
GFR calc non Af Amer: >60 mL/min (ref >60)
GLUCOSE: 183 mg/dL — AB (ref 65–99)
Glucose, Bld: 107 mg/dL — ABNORMAL HIGH (ref 65–99)
POTASSIUM: 2.7 mmol/L — AB (ref 3.5–5.1)
POTASSIUM: 3.5 mmol/L (ref 3.5–5.1)
SODIUM: 134 mmol/L — AB (ref 135–145)
Sodium: 130 mmol/L — ABNORMAL LOW (ref 135–145)

## 2017-07-31 LAB — CBC
HEMATOCRIT: 28.7 % — AB (ref 36.0–46.0)
HEMOGLOBIN: 9.6 g/dL — AB (ref 12.0–15.0)
MCH: 29.1 pg (ref 26.0–34.0)
MCHC: 33.4 g/dL (ref 30.0–36.0)
MCV: 87 fL (ref 78.0–100.0)
Platelets: 312 10*3/uL (ref 150–400)
RBC: 3.3 MIL/uL — AB (ref 3.87–5.11)
RDW: 15.5 % (ref 11.5–15.5)
WBC: 11.9 10*3/uL — ABNORMAL HIGH (ref 4.0–10.5)

## 2017-07-31 LAB — GLUCOSE, CAPILLARY
GLUCOSE-CAPILLARY: 104 mg/dL — AB (ref 65–99)
GLUCOSE-CAPILLARY: 108 mg/dL — AB (ref 65–99)
GLUCOSE-CAPILLARY: 152 mg/dL — AB (ref 65–99)
Glucose-Capillary: 119 mg/dL — ABNORMAL HIGH (ref 65–99)
Glucose-Capillary: 130 mg/dL — ABNORMAL HIGH (ref 65–99)

## 2017-07-31 MED ORDER — CEFAZOLIN SODIUM-DEXTROSE 2-4 GM/100ML-% IV SOLN
2.0000 g | Freq: Three times a day (TID) | INTRAVENOUS | Status: DC
  Administered 2017-07-31 – 2017-08-01 (×4): 2 g via INTRAVENOUS
  Filled 2017-07-31 (×5): qty 100

## 2017-07-31 MED ORDER — POTASSIUM CHLORIDE CRYS ER 20 MEQ PO TBCR
40.0000 meq | EXTENDED_RELEASE_TABLET | Freq: Once | ORAL | Status: AC
  Administered 2017-07-31: 40 meq via ORAL
  Filled 2017-07-31: qty 2

## 2017-07-31 MED ORDER — LORAZEPAM 2 MG/ML IJ SOLN
1.0000 mg | Freq: Once | INTRAMUSCULAR | Status: AC
  Administered 2017-07-31: 1 mg via INTRAVENOUS

## 2017-07-31 MED ORDER — SODIUM CHLORIDE 0.9 % IV SOLN
1.5000 g | Freq: Four times a day (QID) | INTRAVENOUS | Status: DC
  Administered 2017-07-31: 1.5 g via INTRAVENOUS
  Filled 2017-07-31 (×2): qty 1.5

## 2017-07-31 MED ORDER — POTASSIUM CHLORIDE 10 MEQ/100ML IV SOLN
10.0000 meq | INTRAVENOUS | Status: AC
  Administered 2017-07-31 (×3): 10 meq via INTRAVENOUS
  Filled 2017-07-31 (×3): qty 100

## 2017-07-31 MED ORDER — OXYCODONE HCL 5 MG PO TABS
5.0000 mg | ORAL_TABLET | Freq: Four times a day (QID) | ORAL | Status: DC | PRN
  Administered 2017-07-31 – 2017-08-01 (×3): 5 mg via ORAL
  Filled 2017-07-31 (×3): qty 1

## 2017-07-31 MED ORDER — SULFAMETHOXAZOLE-TRIMETHOPRIM 800-160 MG PO TABS: 1.0000 | ORAL_TABLET | Freq: Two times a day (BID) | ORAL | Status: DC

## 2017-07-31 MED ORDER — INSULIN GLARGINE 100 UNIT/ML ~~LOC~~ SOLN
18.0000 [IU] | Freq: Every day | SUBCUTANEOUS | Status: DC
  Administered 2017-07-31 – 2017-08-01 (×2): 18 [IU] via SUBCUTANEOUS
  Filled 2017-07-31 (×2): qty 0.18

## 2017-07-31 MED ORDER — INSULIN ASPART 100 UNIT/ML ~~LOC~~ SOLN
0.0000 [IU] | Freq: Three times a day (TID) | SUBCUTANEOUS | Status: DC
  Administered 2017-07-31 – 2017-08-01 (×2): 2 [IU] via SUBCUTANEOUS
  Administered 2017-08-01: 1 [IU] via SUBCUTANEOUS

## 2017-07-31 MED ORDER — SODIUM CHLORIDE 0.9 % IV SOLN
1.0000 g | Freq: Once | INTRAVENOUS | Status: AC
  Administered 2017-07-31: 1 g via INTRAVENOUS
  Filled 2017-07-31: qty 10

## 2017-07-31 NOTE — Progress Notes (Addendum)
ADDENDUM Changing antibiotics to Ancef. CrCL >6550mL/min  Plan: Ancef 2g IV q8h Follow renal function  Dyanna Seiter D. Mylynn Dinh, PharmD, BCPS Clinical Pharmacist 07/31/2017 3:31 PM    Pharmacy Antibiotic Note  Barbara Cowerreeta Richardson ChiquitoShaikh is a 27 y.o. female admitted on 07/26/2017 with cellulitis.  Pharmacy has been consulted for Unasyn dosing.  Patient was on PO Bactrim SS (400/80) BID x3 doses, but ENT recommends she receives more IV antibiotic doses.  WBC 11.9, afebrile.   Plan: Unasyn 1.5g IV q6h Follow renal function, clinical progression, LOT  If patient is discharged on PO Bactrim, her dose should be 1 DS (800/160) tab BID  Height: 5' 0.98" (154.9 cm) Weight: 168 lb 3.2 oz (76.3 kg) IBW/kg (Calculated) : 47.76  Temp (24hrs), Avg:99 F (37.2 C), Min:98.9 F (37.2 C), Max:99.1 F (37.3 C)  Recent Labs  Lab 07/27/17 0710  07/27/17 0815 07/27/17 2227 07/28/17 0520 07/28/17 2036 07/29/17 0742 07/31/17 0318 07/31/17 1001  WBC  --    < >  --  13.9* 13.6* 11.3* 10.8* 11.9*  --   CREATININE 0.50  --   --   --  0.62  --  0.58 0.49 0.51  LATICACIDVEN  --   --  1.46  --   --   --   --   --   --    < > = values in this interval not displayed.    Estimated Creatinine Clearance: 98.7 mL/min (by C-G formula based on SCr of 0.51 mg/dL).    Allergies  Allergen Reactions  . Vicodin [Hydrocodone-Acetaminophen] Itching and Nausea Only   1/16 MRSA PCR: neg 1/15 R neck abscess: few MSSA (R- Cipro) 1/15 blood x 2: ngtd  Levaquin PTA Vanc 1/14 >> 1/17 Zosyn 1/14 >> 1/17 Ancef 1/17 >>1/18 Bactrim 1/18>>1/19 Unasyn 1/19>>  Thank you for allowing pharmacy to be a part of this patient's care.  Lubna Stegeman D. Katelen Luepke, PharmD, BCPS Clinical Pharmacist Clinical Phone for 07/31/2017 until 3:30pm: [phone] If after 3:30pm, please call main pharmacy at [phone] 07/31/2017 11:40 AM

## 2017-07-31 NOTE — Progress Notes (Signed)
   Subjective:    Patient ID: Angela Villegas, female    DOB: September 12, 1990, 27 y.o.   MRN: [id number]  HPI  Feeling better.  Infected zone not very painful, somewhat numb.   Review of Systems     Objective:   Physical Exam Tm 99.1  VSS Alert, NAD Right cheek/neck with firm edema, Penrose out and hanging by suture, minimal purulent fluid expressible at wound, lower incision intact with staples    Assessment & Plan:  Right neck abscess  Penrose drain was out so was removed.  I explained to the patient and her companion the need to milk the abscess frequently to promote drainage.  She may benefit from one more day of IV antibiotics but might be able to be discharged today.  Culture grew sensitive Staph aureus.  Will need to complete course of oral antibiotics after discharge.

## 2017-07-31 NOTE — Progress Notes (Signed)
CRITICAL VALUE ALERT  Critical Value:  Potassium  2.7, calcium 6.4  Date & Time Notied:  07/31/17 at 0415  Provider Notified: bodenheimer  Orders Received/Actions taken:  Iv and oral potassium and iv calcium gluconate.

## 2017-07-31 NOTE — Progress Notes (Addendum)
PROGRESS NOTE    Angela Villegas  RUE:[id number] DOB: 01/09/1991 DOA: 07/26/2017 PCP: Shirlean Mylar, MD    Brief Narrative: Angela Villegas is a 27 y.o. female with a Past Medical History of alcoholic cirrhosis; HTN; and ADHD who presents with right jaw/facial cellulitis.  Patient presented to [facility] last evening for failure of outpatient therapy (Levaquin).  Patient with elevated glucose to 410, anion gap of 18, but no apparent acidosis.  CT was concerning for a large loculated abscess and Dr. Jenne Pane recommended transfer to [facility].  She was given Vanc and Zosyn and placed on CIWA protocol.  She eventually had an ER to ER transfer.  She underwent incision and drainage of right neck abscess 1-15. Overnight she has been bleeding at incision site. 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  Continue with IV antibiotics. On Ancef. Culture grew staph Aureus pan sensitive.  Leukocytosis trending down.  Drain removed today, was almost out.  Will keep one more day on IV antibiotics. Ancef.  Transition from IV pain med to oral.   2-Acute blood loss anemia. Post operative hemorrhage.  Bleeding from surgical site.  Received 2 units PRBC>  She was taken to OR for cauterization of bleeding vessels.  Hb now stable.   3-New onset DM;  SSI.  Lantus decrease to 18 to avoid hypoglycemia.  HB A1c at 11.   4-Hyponatremia, hypokalemia; replete with IV fluids.  Replete orally and IV.  Check mg level.   ETOH withdrawal with cirrhosis and thrombocytopenia -Holding inderal due  to bleeding. -Continue with CIWA.  -transaminases-- follow trend.   Injury (inflammation/mass effect) of right internal jugular vein Admitting PA discussed with Vascular " No acute intervention needed with recommendation to utilize DVT prophylaxis level anticoagulation; VVS aware with patient with low platelets and active bleeding-discussed with attending and postoperatively consider DVT prophylactic dose i.e. 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  Disposition Plan: remain inpatient.    Consultants:   Dr Annalee Genta   Procedures:   Incision and drainage right neck abscess 1-15  Antimicrobials:  Vancomycin 1-15 Zosyn 1-15  Subjective: Still with neck pain.    Objective: Vitals:   07/30/17 2023 07/31/17 0316 07/31/17 0412 07/31/17 0507  BP: 114/74 120/85 137/89   Pulse: 93 89 95   Resp: 18  18   Temp: 98.9 F (37.2 C)  99.1 F (37.3 C)   TempSrc: Oral  Oral   SpO2: 95%  97%   Weight:    76.3 kg (168 lb 3.2 oz)  Height:        Intake/Output Summary (Last 24 hours) at 07/31/2017 1500 Last data filed at 07/31/2017 1000 Gross per 24 hour  Intake 2720 ml  Output -  Net 2720 ml   Filed Weights   07/29/17 0415 07/30/17 0542 07/31/17 0507  Weight: 76.3 kg (168 lb 4.8 oz) 76.6 kg (168 lb 12.8 oz) 76.3 kg (168 lb 3.2 oz)    Examination:  General exam: NAD, neck with clean dressing.  Respiratory system: CTA Cardiovascular system:  S 1, S 2 RRR Gastrointestinal system: BS present, soft, nt Central nervous system: alert  Extremities no edema     Data Reviewed: I have  personally reviewed following labs and imaging studies  CBC: Recent Labs  Lab 07/26/17 1920 07/27/17 0802  07/27/17 2227 07/28/17 0520 07/28/17 1125 07/28/17 2036 07/29/17 0742 07/30/17 0936 07/31/17 0318  WBC 12.5* 11.7*  --  13.9* 13.6*  --  11.3* 10.8*  --  11.9*  NEUTROABS 10.4* 10.2*  --  12.4*  --   --   --   --   --   --   HGB 12.0 11.7*   < > 10.1* 8.4* 7.5* 9.2* 9.0* 9.2* 9.6*  HCT 33.6* 33.4*   < > 29.6* 25.5* 22.0* 27.4* 26.7* 27.2* 28.7*  MCV 79.8 81.3  --  84.3 84.7  --  85.9 85.6  --  87.0  PLT 101* 92*  --  112* 131*  --  146* 187  --  312   < > = values in this interval not displayed.   Basic Metabolic Panel: Recent Labs  Lab 07/27/17 0710  07/28/17 0520 07/28/17 1125 07/29/17 0742 07/31/17 0318 07/31/17 1001  NA 125*   < > 127* 129* 130* 130* 134*  K 3.3*   < > 3.4* 3.4* 3.6 2.7* 3.5  CL 88*  --  93*  --  96* 97* 99*  CO2 21*  --  19*  --  24 20* 23  GLUCOSE 370*   < > 248* 238* 302* 107* 183*  BUN 8  --  9  --  6 <5* <5*  CREATININE 0.50  --  0.62  --  0.58 0.49 0.51  CALCIUM 7.0*  --  6.7*  --  6.6* 6.4* 6.9*   < > = values in this interval not displayed.   GFR: Estimated Creatinine Clearance: 98.7 mL/min (by C-G formula based on SCr of 0.51 mg/dL). Liver Function Tests: Recent Labs  Lab 07/26/17 1920 07/27/17 0710 07/28/17 0520  AST 96* 87* 86*  ALT 12* 11* 10*  ALKPHOS 243* 213* 170*  BILITOT 5.4* 6.7* 6.8*  PROT 8.7* 7.8 6.6  ALBUMIN 2.8* 2.5* 2.1*   No results for input(s): LIPASE, AMYLASE in the last 168 hours. No results for input(s): AMMONIA in the last 168 hours. Coagulation Profile: Recent Labs  Lab 07/27/17 0958 07/28/17 0901  INR 1.43 1.53   Cardiac Enzymes: Recent Labs  Lab 07/27/17 0802  CKTOTAL 111   BNP (last 3 results) No results for input(s): PROBNP in the last 8760 hours. HbA1C: No results for input(s): HGBA1C in the last 72 hours. CBG: Recent Labs  Lab 07/30/17 2021 07/31/17 0025 07/31/17 0410  07/31/17 0816 07/31/17 1257  GLUCAP 182* 130* 104* 108* 152*   Lipid Profile: No results for input(s): CHOL, HDL, LDLCALC, TRIG, CHOLHDL, LDLDIRECT in the last 72 hours. Thyroid Function Tests: No results for input(s): TSH, T4TOTAL, FREET4, T3FREE, THYROIDAB in the last 72 hours. Anemia Panel: No results for input(s): VITAMINB12, FOLATE, FERRITIN, TIBC, IRON, RETICCTPCT in the last 72 hours. Sepsis Labs: Recent Labs  Lab 07/27/17 0815  LATICACIDVEN 1.46    Recent  Results (from the past 240 hour(s))  Culture, blood (Routine X 2) w Reflex to ID Panel     Status: None (Preliminary result)   Collection Time: 07/27/17 11:45 AM  Result Value Ref Range Status   Specimen Description BLOOD RIGHT ANTECUBITAL  Final   Special Requests   Final    BOTTLES DRAWN AEROBIC AND ANAEROBIC Blood Culture adequate volume   Culture NO GROWTH 4 DAYS  Final   Report Status PENDING  Incomplete  Culture, blood (Routine X 2) w Reflex to ID Panel     Status: None (Preliminary result)   Collection Time: 07/27/17 12:00 PM  Result Value Ref Range Status   Specimen Description BLOOD RIGHT HAND  Final   Special Requests   Final    BOTTLES DRAWN AEROBIC AND ANAEROBIC Blood Culture adequate volume   Culture NO GROWTH 4 DAYS  Final   Report Status PENDING  Incomplete  Aerobic/Anaerobic Culture (surgical/deep wound)     Status: None (Preliminary result)   Collection Time: 07/27/17  3:39 PM  Result Value Ref Range Status   Specimen Description ABSCESS RIGHT NECK  Final   Special Requests NONE  Final   Gram Stain   Final    NO WBC SEEN NO SQUAMOUS EPITHELIAL CELLS SEEN RARE GRAM POSITIVE COCCI IN PAIRS    Culture   Final    FEW STAPHYLOCOCCUS AUREUS NO ANAEROBES ISOLATED; CULTURE IN PROGRESS FOR 5 DAYS    Report Status PENDING  Incomplete   Organism ID, Bacteria STAPHYLOCOCCUS AUREUS  Final      Susceptibility   Staphylococcus aureus - MIC*    CIPROFLOXACIN >=8 RESISTANT Resistant     ERYTHROMYCIN  <=0.25 SENSITIVE Sensitive     GENTAMICIN <=0.5 SENSITIVE Sensitive     OXACILLIN 0.5 SENSITIVE Sensitive     TETRACYCLINE <=1 SENSITIVE Sensitive     VANCOMYCIN <=0.5 SENSITIVE Sensitive     TRIMETH/SULFA <=10 SENSITIVE Sensitive     CLINDAMYCIN <=0.25 SENSITIVE Sensitive     RIFAMPIN <=0.5 SENSITIVE Sensitive     Inducible Clindamycin NEGATIVE Sensitive     * FEW STAPHYLOCOCCUS AUREUS  MRSA PCR Screening     Status: None   Collection Time: 07/28/17  7:41 AM  Result Value Ref Range Status   MRSA by PCR NEGATIVE NEGATIVE Final    Comment:        The GeneXpert MRSA Assay (FDA approved for NASAL specimens only), is one component of a comprehensive MRSA colonization surveillance program. It is not intended to diagnose MRSA infection nor to guide or monitor treatment for MRSA infections.          Radiology Studies: No results found.      Scheduled Meds: . bacitracin   Topical BID  . ferrous sulfate  325 mg Oral BID WC  . insulin aspart  0-9 Units Subcutaneous TID WC  . insulin glargine  18 Units Subcutaneous Daily  . lidocaine  1 patch Transdermal Q24H  . living well with diabetes book   Does not apply Once  . LORazepam  0-4 mg Intravenous Q6H   Or  . LORazepam  0-4 mg Oral Q6H  . ondansetron (ZOFRAN) IV  4 mg Intravenous Once  . potassium chloride  40 mEq Oral Once  . sertraline  100 mg Oral Daily  . thiamine  100 mg Oral Daily   Continuous Infusions: . sodium chloride 75 mL/hr at 07/31/17 0509  . ampicillin-sulbactam (UNASYN) IV 1.5 g (07/31/17 1435)  LOS: 5 days    Time spent: 35 minutes    Alba Cory, MD [facility] Pager [phone]  If 7PM-7AM, please contact night-coverage [email or web address] Password [email or web address] 07/31/2017, 3:00 PM

## 2017-07-31 NOTE — Progress Notes (Signed)
PHARMACY NOTE:  ANTIMICROBIAL RENAL DOSAGE ADJUSTMENT  Current antimicrobial regimen includes a mismatch between antimicrobial dosage and estimated renal function.  As per policy approved by the Pharmacy & Therapeutics and Medical Executive Committees, the antimicrobial dosage will be adjusted accordingly.  Current antimicrobial dosage:  Bactrim SS 1 tab BID  Indication: facial cellulitis with abscess s/p I&D  Renal Function:  Estimated Creatinine Clearance: 98.7 mL/min (by C-G formula based on SCr of 0.49 mg/dL). []      On intermittent HD, scheduled: []      On CRRT    Antimicrobial dosage has been changed to:  Bactrim DS 1 tab BID  Additional comments:     Thank you for allowing pharmacy to be a part of this patient's care.  Brigid ReBajbus, Demeco Ducksworth, [facility] IncRPH 07/31/2017 10:26 AM

## 2017-08-01 LAB — CULTURE, BLOOD (ROUTINE X 2)
CULTURE: NO GROWTH
Culture: NO GROWTH
SPECIAL REQUESTS: ADEQUATE
SPECIAL REQUESTS: ADEQUATE

## 2017-08-01 LAB — BASIC METABOLIC PANEL
Anion gap: 11 (ref 5–15)
CO2: 21 mmol/L — AB (ref 22–32)
CREATININE: 0.5 mg/dL (ref 0.44–1.00)
Calcium: 6.8 mg/dL — ABNORMAL LOW (ref 8.9–10.3)
Chloride: 100 mmol/L — ABNORMAL LOW (ref 101–111)
GFR calc Af Amer: >60 mL/min (ref >60)
GFR calc non Af Amer: >60 mL/min (ref >60)
Glucose, Bld: 124 mg/dL — ABNORMAL HIGH (ref 65–99)
Potassium: 3.5 mmol/L (ref 3.5–5.1)
Sodium: 132 mmol/L — ABNORMAL LOW (ref 135–145)

## 2017-08-01 LAB — AEROBIC/ANAEROBIC CULTURE (SURGICAL/DEEP WOUND)

## 2017-08-01 LAB — GLUCOSE, CAPILLARY
Glucose-Capillary: 190 mg/dL — ABNORMAL HIGH (ref 65–99)
Glucose-Capillary: 129 mg/dL — ABNORMAL HIGH (ref 65–99)
Glucose-Capillary: 157 mg/dL — ABNORMAL HIGH (ref 65–99)

## 2017-08-01 LAB — AEROBIC/ANAEROBIC CULTURE W GRAM STAIN (SURGICAL/DEEP WOUND): Gram Stain: NONE SEEN

## 2017-08-01 LAB — MAGNESIUM: Magnesium: 1.3 mg/dL — ABNORMAL LOW (ref 1.7–2.4)

## 2017-08-01 MED ORDER — MAGNESIUM OXIDE 400 (241.3 MG) MG PO TABS: 400.0000 mg | ORAL_TABLET | Freq: Two times a day (BID) | ORAL | Status: DC

## 2017-08-01 MED ORDER — POTASSIUM CHLORIDE CRYS ER 20 MEQ PO TBCR: 40.0000 meq | EXTENDED_RELEASE_TABLET | Freq: Every day | ORAL | 0 refills | Status: DC

## 2017-08-01 MED ORDER — SULFAMETHOXAZOLE-TRIMETHOPRIM 800-160 MG PO TABS: 1.0000 | ORAL_TABLET | Freq: Two times a day (BID) | ORAL | 0 refills | Status: DC

## 2017-08-01 MED ORDER — POTASSIUM CHLORIDE CRYS ER 20 MEQ PO TBCR
40.0000 meq | EXTENDED_RELEASE_TABLET | Freq: Every day | ORAL | Status: DC
  Administered 2017-08-01: 40 meq via ORAL
  Filled 2017-08-01: qty 2

## 2017-08-01 MED ORDER — FERROUS SULFATE 325 (65 FE) MG PO TABS: 325.0000 mg | ORAL_TABLET | Freq: Two times a day (BID) | ORAL | 3 refills | Status: DC

## 2017-08-01 MED ORDER — MAGNESIUM SULFATE 2 GM/50ML IV SOLN
2.0000 g | Freq: Once | INTRAVENOUS | Status: AC
  Administered 2017-08-01: 2 g via INTRAVENOUS
  Filled 2017-08-01: qty 50

## 2017-08-01 MED ORDER — OXYCODONE HCL 5 MG PO TABS: 5.0000 mg | ORAL_TABLET | Freq: Four times a day (QID) | ORAL | 0 refills | Status: DC | PRN

## 2017-08-01 MED ORDER — MAGNESIUM OXIDE 400 (241.3 MG) MG PO TABS: 400.0000 mg | ORAL_TABLET | Freq: Two times a day (BID) | ORAL | 0 refills | Status: AC

## 2017-08-01 MED ORDER — INSULIN GLARGINE 100 UNIT/ML ~~LOC~~ SOLN: 18.0000 [IU] | Freq: Every day | SUBCUTANEOUS | 11 refills | Status: DC

## 2017-08-01 NOTE — Discharge Summary (Signed)
Physician Discharge Summary  Dorian Furnacereeta Paquette ZOX:[id number] DOB: 07-16-1990 DOA: 07/26/2017  PCP: Shirlean MylarWebb, Carol, MD  Admit date: 07/26/2017 Discharge date: 08/01/2017  Admitted From: Home  Disposition:  Home   Recommendations for Outpatient Follow-up:  1. Follow up with PCP in 1-2 weeks 2. Please obtain BMP/CBC in one week 3. Continue counseling for alcohol cessation.  4. Follow up with ENT for neck abscess.  5. Follow mg level.   Home Health: no   Discharge Condition: stable.  CODE STATUS: full code.  Diet recommendation: Heart Healthy   Brief/Interim Summary:  Brief Narrative: Hans Edenreeta Shaikhis a 27 y.o.femalewith a Past Medical History of alcoholic cirrhosis; HTN; and ADHD who presents with right jaw/facial cellulitis. [facility] for failure of outpatient therapy (Levaquin). Patient with elevated glucose to 410, anion gap of 18, but no apparent acidosis. CT was concerning for a large loculated abscess and Dr. Jenne PaneBates recommended [facility]. She was given Vanc and Zosyn and placed on CIWA protocol. She eventually had an ER to ER transfer.  She underwent incision and drainage of right neck abscess 1-15. Overnight she has been bleeding at incision site. 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  Continue with IV antibiotics. On Ancef. Culture grew staph Aureus pan sensitive.  Leukocytosis trending down.  Drain removed today, was almost out. Dr bates advised patient to continue with drainage of wound.  She  will be discharge on Bactrim for 7 more days. Will need to follow up closely with EN.  Transition from IV pain med to oral. will provide short course of oxycodone.   2-Acute blood loss anemia. Post operative hemorrhage.  Bleeding from surgical site.  Received 2 units PRBC>  She was taken to OR for cauterization of bleeding vessels.  Hb  stable.   3-New onset DM;  SSI.  Lantus decrease to 18 to avoid hypoglycemia.  HB A1c at 11.   4-Hyponatremia, hypokalemia; replete with IV fluids.  Replete orally and IV.  Discharge on oral magnesium.   ETOH withdrawal with cirrhosis and thrombocytopenia -Holding inderal due to bleeding. -Continue with CIWA.  -transaminases-- follow trend.   Injury (inflammation/mass effect) of right internal jugular vein Admitting PA discussed with Vascular " No acute intervention needed with recommendation to utilize DVT prophylaxis level anticoagulation; VVS aware with patient with low platelets and active bleeding-discussed with attending and postoperatively considerDVTprophylacticdosei.e. low-dose IV heparin which can be rapidly reversed -VVSalso reinforcedthat primary treatment would be to decompress abscess/fluid collections that are causing mass-effect against the vein to minimize inflammatory effect and secondary development of thrombosis "   ADD (attention deficit disorder) -On Adderall at home    Discharge Diagnoses:  Principal Problem:   Cellulitis and abscess of neck Active Problems:   Pyomyositis of neck   Injury (inflammation/mass effect) of right internal jugular vein   Diabetes mellitus, new onset (HCC)   Alcoholic cirrhosis (HCC)   Thrombocytopenia with coagulopathy   Hypertension   Asthma   ADD (attention deficit disorder)   Central obesity-BMI  30.06   Acute hypokalemia    Discharge Instructions  Discharge Instructions    Diet - low sodium heart healthy   Complete by:  As directed    Increase activity slowly    Complete by:  As directed      Allergies as of 08/01/2017      Reactions   Vicodin [hydrocodone-acetaminophen] Itching, Nausea Only      Medication List    STOP taking these medications   furosemide 40 MG tablet Commonly known as:  LASIX   oxycodone 5 MG capsule Commonly known as:  OXY-IR Replaced by:  oxyCODONE 5 MG immediate release tablet   spironolactone 100 MG tablet Commonly known as:  ALDACTONE     TAKE these medications   amphetamine-dextroamphetamine 15 MG 24 hr capsule Commonly known as:  ADDERALL XR Take 15 mg by mouth every morning.   amphetamine-dextroamphetamine 10 MG tablet Commonly known as:  ADDERALL Take 10 mg by mouth every evening.   ARTIFICIAL TEARS OP Apply to eye.   DAYQUIL MULTI-SYMPTOM COLD/FLU PO Take 30 mLs by mouth daily as needed (cough).   ferrous sulfate 325 (65 FE) MG tablet Take 1 tablet (325 mg total) by mouth 2 (two) times daily with a meal.   fluticasone 50 MCG/ACT nasal spray Commonly known as:  FLONASE Place 2 sprays into both nostrils daily as needed for allergies.   guaifenesin 100 MG/5ML syrup Commonly known as:  ROBITUSSIN Take 200 mg by mouth 3 (three) times daily as needed for cough.   ibuprofen 200 MG tablet Commonly known as:  ADVIL,MOTRIN Take 400 mg by mouth every 6 (six) hours as needed for headache or mild pain.   insulin glargine 100 UNIT/ML injection Commonly known as:  LANTUS Inject 0.18 mLs (18 Units total) into the skin daily. Start taking on:  08/02/2017   magnesium oxide 400 (241.3 Mg) MG tablet Commonly known as:  MAG-OX Take 1 tablet (400 mg total) by mouth 2 (two) times daily. Start taking on:  08/02/2017   olopatadine 0.1 % ophthalmic solution Commonly known as:  PATANOL Place 1 drop into the right eye daily.   oxyCODONE 5 MG immediate release tablet Commonly known as:  Oxy IR/ROXICODONE Take 1 tablet (5 mg total) by mouth every 6 (six) hours as needed for moderate pain. Replaces:   oxycodone 5 MG capsule   potassium chloride SA 20 MEQ tablet Commonly known as:  K-DUR,KLOR-CON Take 2 tablets (40 mEq total) by mouth daily.   PROAIR HFA 108 (90 Base) MCG/ACT inhaler Generic drug:  albuterol Inhale 2 puffs into the lungs every 4 (four) hours as needed for wheezing.   propranolol 10 MG tablet Commonly known as:  INDERAL Take 10 mg by mouth 2 (two) times daily.   sertraline 100 MG tablet Commonly known as:  ZOLOFT Take 100 mg by mouth daily.   sulfamethoxazole-trimethoprim 800-160 MG tablet Commonly known as:  BACTRIM DS,SEPTRA DS Take 1 tablet by mouth 2 (two) times daily.      Follow-up Information    Shirlean Mylar, MD Follow up in 1 week(s).   Specialty:  Family Medicine Contact information: [address] [place] [address] [phone]        Osborn Coho, MD Follow up in 1 week(s).   Specialty:  Otolaryngology Contact information: [address] [place] [address] [phone]          Allergies  Allergen Reactions  . Vicodin [Hydrocodone-Acetaminophen] Itching and Nausea  Only    Consultations:  [facility] E[facility]   Procedures/Studies: Ct Soft Tissue Neck W Contrast  Result Date: 07/26/2017 CLINICAL DATA:  28 y/o F; large boil on right-sided neck just below mandible for 2 weeks with fever, nausea, and vomiting. EXAM: CT NECK WITH CONTRAST TECHNIQUE: Multidetector CT imaging of the neck was performed using the standard protocol following the bolus administration of intravenous contrast. CONTRAST:  75mL ISOVUE-300 IOPAMIDOL (ISOVUE-300) INJECTION 61% COMPARISON:  None. FINDINGS: Pharynx and larynx: Normal. No mass or swelling. Salivary glands: Mild edema of the parotid gland. Other salivary glands are normal. Thyroid: Normal. Lymph nodes: Right anterior and posterior cervical chain, right suboccipital, and right submandibular lymphadenopathy. No lymph node necrosis. No enlarged left-sided cervical  lymph nodes. 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 Limited intracranial: Negative. Visualized orbits: Negative. Mastoids and visualized paranasal sinuses: Clear. Skeleton: No acute or aggressive process. Upper chest: Negative. Other: Massive swelling of the right proximal sternocleidomastoid muscle with multiloculated areas of fluid attenuation spanning 6.2 x 4.7 x 5.3 cm (volume = 81 cm^3). Extensive surrounding inflammation throughout the subcutaneous fat of the right face, right anterior neck, extending across the midline below the chin. Edema in right masticator space, right parotid space, right parapharyngeal space, and right carotid space. Thickening of right platysma muscle and right superficial parotid fascia. IMPRESSION: 1. 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 Total volume approximately 81 cc. 2. Extensive inflammation throughout right masticator space, right parotid space, right parapharyngeal space, right carotid space, and throughout subcutaneous fat of right face, anterior neck, and chin. 3. 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 4. Right cervical, submandibular, and suboccipital lymphadenopathy, likely reactive. No lymph node necrosis. Electronically Signed   By: Mitzi Hansen M.D.   On: 07/26/2017 20:02      Subjective: She is feeling better, boyfriend has been draining Milking the wound as advised by Dr Jenne Pane  Discharge Exam: Vitals:   08/01/17 0600 08/01/17 1423  BP: 135/81 131/90  Pulse: 98 (!) 109  Resp:    Temp:  98 F (36.7 C)  SpO2:  97%   Vitals:   08/01/17 0448 08/01/17 0453 08/01/17 0600 08/01/17 1423  BP:  128/78 135/81 131/90  Pulse:  98 98 (!) 109  Resp:  18    Temp:  99.9 F (37.7 C)  98 F (36.7 C)  TempSrc:  Oral     SpO2:  97%  97%  Weight: 73.1 kg (161 lb 1.6 oz)     Height:        General: Pt is alert, awake, not in acute distress, neck with small open wound, and suture, some induration, decreasing in size.  Cardiovascular: RRR, S1/S2 +, no rubs, no gallops Respiratory: CTA bilaterally, no wheezing, no rhonchi Abdominal: Soft, NT, ND, bowel sounds + Extremities: no edema, no cyanosis    The results of significant diagnostics from this hospitalization (including imaging, microbiology, ancillary and laboratory) are listed below for reference.     Microbiology: Recent Results (from the past 240 hour(s))  Culture, blood (Routine X 2) w Reflex to ID Panel     Status: None   Collection Time: 07/27/17 11:45 AM  Result Value Ref Range Status   Specimen Description BLOOD RIGHT ANTECUBITAL  Final   Special Requests   Final    BOTTLES DRAWN AEROBIC AND  ANAEROBIC Blood Culture adequate volume   Culture NO GROWTH 5 DAYS  Final   Report Status 08/01/2017 FINAL  Final  Culture, blood (Routine X 2) w Reflex to ID Panel     Status: None   Collection Time: 07/27/17 12:00 PM  Result Value Ref Range Status   Specimen Description BLOOD RIGHT HAND  Final   Special Requests   Final    BOTTLES DRAWN AEROBIC AND ANAEROBIC Blood Culture adequate volume   Culture NO GROWTH 5 DAYS  Final   Report Status 08/01/2017 FINAL  Final  Aerobic/Anaerobic Culture (surgical/deep wound)     Status: None   Collection Time: 07/27/17  3:39 PM  Result Value Ref Range Status   Specimen Description ABSCESS RIGHT NECK  Final   Special Requests NONE  Final   Gram Stain   Final    NO WBC SEEN NO SQUAMOUS EPITHELIAL CELLS SEEN RARE GRAM POSITIVE COCCI IN PAIRS    Culture FEW STAPHYLOCOCCUS AUREUS NO ANAEROBES ISOLATED   Final   Report Status 08/01/2017 FINAL  Final   Organism ID, Bacteria STAPHYLOCOCCUS AUREUS  Final      Susceptibility   Staphylococcus aureus - MIC*    CIPROFLOXACIN >=8 RESISTANT Resistant      ERYTHROMYCIN <=0.25 SENSITIVE Sensitive     GENTAMICIN <=0.5 SENSITIVE Sensitive     OXACILLIN 0.5 SENSITIVE Sensitive     TETRACYCLINE <=1 SENSITIVE Sensitive     VANCOMYCIN <=0.5 SENSITIVE Sensitive     TRIMETH/SULFA <=10 SENSITIVE Sensitive     CLINDAMYCIN <=0.25 SENSITIVE Sensitive     RIFAMPIN <=0.5 SENSITIVE Sensitive     Inducible Clindamycin NEGATIVE Sensitive     * FEW STAPHYLOCOCCUS AUREUS  MRSA PCR Screening     Status: None   Collection Time: 07/28/17  7:41 AM  Result Value Ref Range Status   MRSA by PCR NEGATIVE NEGATIVE Final    Comment:        The GeneXpert MRSA Assay (FDA approved for NASAL specimens only), is one component of a comprehensive MRSA colonization surveillance program. It is not intended to diagnose MRSA infection nor to guide or monitor treatment for MRSA infections.      Labs: BNP (last 3 results) No results for input(s): BNP in the last 8760 hours. Basic Metabolic Panel: Recent Labs  Lab 07/28/17 0520 07/28/17 1125 07/29/17 0742 07/31/17 0318 07/31/17 1001 08/01/17 0322  NA 127* 129* 130* 130* 134* 132*  K 3.4* 3.4* 3.6 2.7* 3.5 3.5  CL 93*  --  96* 97* 99* 100*  CO2 19*  --  24 20* 23 21*  GLUCOSE 248* 238* 302* 107* 183* 124*  BUN 9  --  6 <5* <5* <5*  CREATININE 0.62  --  0.58 0.49 0.51 0.50  CALCIUM 6.7*  --  6.6* 6.4* 6.9* 6.8*  MG  --   --   --   --   --  1.3*   Liver Function Tests: Recent Labs  Lab 07/26/17 1920 07/27/17 0710 07/28/17 0520  AST 96* 87* 86*  ALT 12* 11* 10*  ALKPHOS 243* 213* 170*  BILITOT 5.4* 6.7* 6.8*  PROT 8.7* 7.8 6.6  ALBUMIN 2.8* 2.5* 2.1*   No results for input(s): LIPASE, AMYLASE in the last 168 hours. No results for input(s): AMMONIA in the last 168 hours. CBC: Recent Labs  Lab 07/26/17 1920 07/27/17 0802  07/27/17 2227 07/28/17 0520 07/28/17 1125 07/28/17 2036 07/29/17 0742 07/30/17 0936 07/31/17 0318  WBC 12.5* 11.7*  --  13.9* 13.6*  --  11.3* 10.8*  --  11.9*  NEUTROABS  10.4* 10.2*  --  12.4*  --   --   --   --   --   --   HGB 12.0 11.7*   < > 10.1* 8.4* 7.5* 9.2* 9.0* 9.2* 9.6*  HCT 33.6* 33.4*   < > 29.6* 25.5* 22.0* 27.4* 26.7* 27.2* 28.7*  MCV 79.8 81.3  --  84.3 84.7  --  85.9 85.6  --  87.0  PLT 101* 92*  --  112* 131*  --  146* 187  --  312   < > = values in this interval not displayed.   Cardiac Enzymes: Recent Labs  Lab 07/27/17 0802  CKTOTAL 111   BNP: Invalid input(s): POCBNP CBG: Recent Labs  Lab 07/31/17 1257 07/31/17 1709 07/31/17 2140 08/01/17 0756 08/01/17 1251  GLUCAP 152* 119* 190* 129* 157*   D-Dimer No results for input(s): DDIMER in the last 72 hours. Hgb A1c No results for input(s): HGBA1C in the last 72 hours. Lipid Profile No results for input(s): CHOL, HDL, LDLCALC, TRIG, CHOLHDL, LDLDIRECT in the last 72 hours. Thyroid function studies No results for input(s): TSH, T4TOTAL, T3FREE, THYROIDAB in the last 72 hours.  Invalid input(s): FREET3 Anemia work up No results for input(s): VITAMINB12, FOLATE, FERRITIN, TIBC, IRON, RETICCTPCT in the last 72 hours. Urinalysis    Component Value Date/Time   COLORURINE YELLOW 07/26/2017 2000   APPEARANCEUR CLEAR 07/26/2017 2000   LABSPEC <1.005 (L) 07/26/2017 2000   PHURINE 8.5 (H) 07/26/2017 2000   GLUCOSEU >=500 (A) 07/26/2017 2000   HGBUR LARGE (A) 07/26/2017 2000   BILIRUBINUR MODERATE (A) 07/26/2017 2000   KETONESUR 15 (A) 07/26/2017 2000   PROTEINUR 100 (A) 07/26/2017 2000   NITRITE NEGATIVE 07/26/2017 2000   LEUKOCYTESUR NEGATIVE 07/26/2017 2000   Sepsis Labs Invalid input(s): PROCALCITONIN,  WBC,  LACTICIDVEN Microbiology Recent Results (from the past 240 hour(s))  Culture, blood (Routine X 2) w Reflex to ID Panel     Status: None   Collection Time: 07/27/17 11:45 AM  Result Value Ref Range Status   Specimen Description BLOOD RIGHT ANTECUBITAL  Final   Special Requests   Final    BOTTLES DRAWN AEROBIC AND ANAEROBIC Blood Culture adequate volume    Culture NO GROWTH 5 DAYS  Final   Report Status 08/01/2017 FINAL  Final  Culture, blood (Routine X 2) w Reflex to ID Panel     Status: None   Collection Time: 07/27/17 12:00 PM  Result Value Ref Range Status   Specimen Description BLOOD RIGHT HAND  Final   Special Requests   Final    BOTTLES DRAWN AEROBIC AND ANAEROBIC Blood Culture adequate volume   Culture NO GROWTH 5 DAYS  Final   Report Status 08/01/2017 FINAL  Final  Aerobic/Anaerobic Culture (surgical/deep wound)     Status: None   Collection Time: 07/27/17  3:39 PM  Result Value Ref Range Status   Specimen Description ABSCESS RIGHT NECK  Final   Special Requests NONE  Final   Gram Stain   Final    NO WBC SEEN NO SQUAMOUS EPITHELIAL CELLS SEEN RARE GRAM POSITIVE COCCI IN PAIRS    Culture FEW STAPHYLOCOCCUS AUREUS NO ANAEROBES ISOLATED   Final   Report Status 08/01/2017 FINAL  Final   Organism ID, Bacteria STAPHYLOCOCCUS AUREUS  Final      Susceptibility   Staphylococcus aureus - MIC*    CIPROFLOXACIN >=8 RESISTANT Resistant  ERYTHROMYCIN <=0.25 SENSITIVE Sensitive     GENTAMICIN <=0.5 SENSITIVE Sensitive     OXACILLIN 0.5 SENSITIVE Sensitive     TETRACYCLINE <=1 SENSITIVE Sensitive     VANCOMYCIN <=0.5 SENSITIVE Sensitive     TRIMETH/SULFA <=10 SENSITIVE Sensitive     CLINDAMYCIN <=0.25 SENSITIVE Sensitive     RIFAMPIN <=0.5 SENSITIVE Sensitive     Inducible Clindamycin NEGATIVE Sensitive     * FEW STAPHYLOCOCCUS AUREUS  MRSA PCR Screening     Status: None   Collection Time: 07/28/17  7:41 AM  Result Value Ref Range Status   MRSA by PCR NEGATIVE NEGATIVE Final    Comment:        The GeneXpert MRSA Assay (FDA approved for NASAL specimens only), is one component of a comprehensive MRSA colonization surveillance program. It is not intended to diagnose MRSA infection nor to guide or monitor treatment for MRSA infections.      Time coordinating discharge: Over 30 minutes  SIGNED:   Alba Cory,  MD  [facility] 08/01/2017, 3:53 PM Pager   If 7PM-7AM, please contact night-coverage [email or web address] Password [email or web address]

## 2017-08-01 NOTE — Progress Notes (Signed)
Discharge instructions reviewed with patient and significant other to include prescriptions, Medications, activity, Wound care, Follow up visits and Diabetes.  Both voice understanding to teaching.  Programme researcher, broadcasting/film/videorinted materials provided.  To door via wheelchair.  Home via POV with her significant other driving.

## 2017-08-02 LAB — GLUCOSE, CAPILLARY: GLUCOSE-CAPILLARY: 288 mg/dL — AB (ref 65–99)

## 2017-09-17 ENCOUNTER — Emergency Department (HOSPITAL_BASED_OUTPATIENT_CLINIC_OR_DEPARTMENT_OTHER)
Admission: EM | Admit: 2017-09-17 | Discharge: 2017-09-17 | Disposition: A | Discharge status: Home / Self Care | Payer: BC Managed Care – PPO | Attending: Emergency Medicine | Admitting: Emergency Medicine

## 2017-09-17 ENCOUNTER — Emergency Department (HOSPITAL_BASED_OUTPATIENT_CLINIC_OR_DEPARTMENT_OTHER): Payer: BC Managed Care – PPO

## 2017-09-17 ENCOUNTER — Encounter (HOSPITAL_BASED_OUTPATIENT_CLINIC_OR_DEPARTMENT_OTHER): Payer: Self-pay | Admitting: *Deleted

## 2017-09-17 ENCOUNTER — Other Ambulatory Visit: Payer: Self-pay

## 2017-09-17 DIAGNOSIS — E1165 Type 2 diabetes mellitus with hyperglycemia: Secondary | ICD-10-CM | POA: Diagnosis not present

## 2017-09-17 DIAGNOSIS — I10 Essential (primary) hypertension: Secondary | ICD-10-CM | POA: Insufficient documentation

## 2017-09-17 DIAGNOSIS — R739 Hyperglycemia, unspecified: Secondary | ICD-10-CM

## 2017-09-17 DIAGNOSIS — J45909 Unspecified asthma, uncomplicated: Secondary | ICD-10-CM | POA: Diagnosis not present

## 2017-09-17 DIAGNOSIS — F17210 Nicotine dependence, cigarettes, uncomplicated: Secondary | ICD-10-CM | POA: Insufficient documentation

## 2017-09-17 DIAGNOSIS — Z79899 Other long term (current) drug therapy: Secondary | ICD-10-CM | POA: Insufficient documentation

## 2017-09-17 DIAGNOSIS — Z794 Long term (current) use of insulin: Secondary | ICD-10-CM | POA: Diagnosis not present

## 2017-09-17 DIAGNOSIS — R780 Finding of alcohol in blood: Secondary | ICD-10-CM | POA: Insufficient documentation

## 2017-09-17 DIAGNOSIS — R410 Disorientation, unspecified: Secondary | ICD-10-CM | POA: Diagnosis present

## 2017-09-17 HISTORY — DX: Type 2 diabetes mellitus without complications: E11.9

## 2017-09-17 LAB — URINALYSIS, ROUTINE W REFLEX MICROSCOPIC
BILIRUBIN URINE: NEGATIVE
Glucose, UA: NEGATIVE mg/dL
KETONES UR: NEGATIVE mg/dL
Leukocytes, UA: NEGATIVE
NITRITE: NEGATIVE
Protein, ur: >300 mg/dL — AB
Specific Gravity, Urine: 1.015 (ref 1.005–1.030)
pH: 7.5 (ref 5.0–8.0)

## 2017-09-17 LAB — CBC WITH DIFFERENTIAL/PLATELET
BASOS ABS: 0.1 10*3/uL (ref 0.0–0.1)
Basophils Relative: 1 %
EOS ABS: 0.2 10*3/uL (ref 0.0–0.7)
EOS PCT: 2 %
HCT: 41.3 % (ref 36.0–46.0)
Hemoglobin: 13.9 g/dL (ref 12.0–15.0)
LYMPHS PCT: 44 %
Lymphs Abs: 3.7 10*3/uL (ref 0.7–4.0)
MCH: 29 pg (ref 26.0–34.0)
MCHC: 33.7 g/dL (ref 30.0–36.0)
MCV: 86.2 fL (ref 78.0–100.0)
MONO ABS: 0.5 10*3/uL (ref 0.1–1.0)
Monocytes Relative: 6 %
Neutro Abs: 4 10*3/uL (ref 1.7–7.7)
Neutrophils Relative %: 47 %
PLATELETS: 220 10*3/uL (ref 150–400)
RBC: 4.79 MIL/uL (ref 3.87–5.11)
RDW: 13.3 % (ref 11.5–15.5)
WBC: 8.4 10*3/uL (ref 4.0–10.5)

## 2017-09-17 LAB — BASIC METABOLIC PANEL
Anion gap: 17 — ABNORMAL HIGH (ref 5–15)
BUN: 7 mg/dL (ref 6–20)
CALCIUM: 8.6 mg/dL — AB (ref 8.9–10.3)
CO2: 25 mmol/L (ref 22–32)
CREATININE: 0.56 mg/dL (ref 0.44–1.00)
Chloride: 93 mmol/L — ABNORMAL LOW (ref 101–111)
Glucose, Bld: 179 mg/dL — ABNORMAL HIGH (ref 65–99)
Potassium: 3.2 mmol/L — ABNORMAL LOW (ref 3.5–5.1)
SODIUM: 135 mmol/L (ref 135–145)

## 2017-09-17 LAB — URINALYSIS, MICROSCOPIC (REFLEX): WBC, UA: NONE SEEN WBC/hpf (ref 0–5)

## 2017-09-17 LAB — I-STAT VENOUS BLOOD GAS, ED
Acid-Base Excess: 2 mmol/L (ref 0.0–2.0)
Bicarbonate: 26.6 mmol/L (ref 20.0–28.0)
O2 Saturation: 96 %
PCO2 VEN: 38.6 mmHg — AB (ref 44.0–60.0)
PO2 VEN: 75 mmHg — AB (ref 32.0–45.0)
Patient temperature: 98.6
TCO2: 28 mmol/L (ref 22–32)
pH, Ven: 7.446 — ABNORMAL HIGH (ref 7.250–7.430)

## 2017-09-17 LAB — RAPID URINE DRUG SCREEN, HOSP PERFORMED
AMPHETAMINES: POSITIVE — AB
BARBITURATES: NOT DETECTED
BENZODIAZEPINES: NOT DETECTED
Cocaine: NOT DETECTED
Opiates: NOT DETECTED
TETRAHYDROCANNABINOL: NOT DETECTED

## 2017-09-17 LAB — CBG MONITORING, ED: GLUCOSE-CAPILLARY: 187 mg/dL — AB (ref 65–99)

## 2017-09-17 LAB — PREGNANCY, URINE: Preg Test, Ur: NEGATIVE

## 2017-09-17 LAB — ETHANOL: ALCOHOL ETHYL (B): 417 mg/dL — AB (ref <10)

## 2017-09-17 MED ORDER — POTASSIUM CHLORIDE CRYS ER 20 MEQ PO TBCR
EXTENDED_RELEASE_TABLET | ORAL | Status: AC
  Filled 2017-09-17: qty 2

## 2017-09-17 MED ORDER — POTASSIUM CHLORIDE CRYS ER 20 MEQ PO TBCR
30.0000 meq | EXTENDED_RELEASE_TABLET | Freq: Once | ORAL | Status: AC
  Administered 2017-09-17: 30 meq via ORAL

## 2017-09-17 MED ORDER — SODIUM CHLORIDE 0.9 % IV BOLUS (SEPSIS)
1000.0000 mL | Freq: Once | INTRAVENOUS | Status: AC
  Administered 2017-09-17: 1000 mL via INTRAVENOUS

## 2017-09-17 MED ORDER — ONDANSETRON HCL 4 MG/2ML IJ SOLN
4.0000 mg | Freq: Once | INTRAMUSCULAR | Status: AC
  Administered 2017-09-17: 4 mg via INTRAVENOUS
  Filled 2017-09-17: qty 2

## 2017-09-17 NOTE — ED Provider Notes (Addendum)
[facility] EMERGENCY DEPARTMENT Provider Note   CSN: [id number] Arrival date & time: 09/17/17  1454     History   Chief Complaint Chief Complaint  Patient presents with  . Hyperglycemia    HPI Angela Villegas is a 27 y.o. female with a history of hypertension, alcohol abuse, alcoholic cirrhosis/hepatitis, insulin dependent DM who presents to the ED today for feeling disorientated. Patient was admitted on 07/26/17 for abscess/cellulitis of the face with positive anion gap, hyperglycemia without acidosis. She was diagnosed with DM during admission with A1c of 11.5. She was discharged home on 18U of Lantus which she has been taking daily since d/c. Notes that her PCP recently increased the dose to 22U a few days ago.   She is presenting today for 1 week of "feeling disorientated". She notes most days she feels "out of it". Like her mind is foggy, she is tired all the time and something is wrong. Her husband notes that over the last 1 week he has smelled fruity odor on her breath.  They have not been taking her glucose regularly except over the last 3-4 days and note that it has been running roughly 200 with a peak of 340 a few days ago.  She notes that she has been eating a low-carb diet since discharge.   The patient is also a chronic alcohol user.  She typically drinks 1/5 of liquor per day.  She has had 6 shots of liquor this a.m., with the last drink reported at 10 AM. She has has had one episode of nonbilious, nonbloody emesis this morning.  Patient denies any trauma, headache, visual changes, facial droop, difficulty with speech, changes in hearing, focal weakness, vertigo, dizziness, lightheadedness, chest pain, shortness of breath, abdominal pain, dysuria, flank pain, suprapubic pain, urinary frequency, urinary urgency, or hematuria. Patient is not taking any SGLT-2 inhibitors.  Has required 1 tap of the abdomen approximally 1 year ago.  No increased abdominal pain, distention or fever  at home. Last dose of insulin this AM. Patient denies other alcohol use (rubbing alcohol etc.).   HPI  Past Medical History:  Diagnosis Date  . ADD (attention deficit disorder)   . Alcohol abuse   . Asthma   . Diabetes mellitus without complication (HCC)   . Hypertension   . Liver disease, unspecified     Patient Active Problem List   Diagnosis Date Noted  . Pyomyositis of neck 07/27/2017  . Injury (inflammation/mass effect) of right internal jugular vein 07/27/2017  . Diabetes mellitus, new onset (HCC) 07/27/2017  . 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 Thrombocytopenia with coagulopathy 07/27/2017  . Hypertension 07/27/2017  . Asthma 07/27/2017  . ADD (attention deficit disorder) 07/27/2017  . 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 Acute hypokalemia 07/27/2017  . Cellulitis and abscess of neck 07/26/2017  . Alcoholic hepatitis with ascites 12/09/2015  . Hepatic failure (HCC) 12/06/2015  . Ascites 12/06/2015  . Alcoholic hepatitis 12/06/2015  . Coagulopathy (HCC) 12/06/2015  . Alcohol abuse 12/06/2015  . Acute alcohol intoxication (HCC)     Past Surgical History:  Procedure Laterality Date  . EXCISION MASS NECK Right 07/28/2017   Procedure: exploration of right neck control of hemorrhage;  Surgeon: Osborn Coho, MD;  Location: [facility] OR;  Service: ENT;  Laterality: Right;  . INCISION AND DRAINAGE ABSCESS Right 07/27/2017   Procedure: INCISION AND DRAINAGE ABSCESS;  Surgeon: Osborn Coho, MD;  Location: [facility] OR;  Service: ENT;  Laterality: Right;    OB History  No data available       Home Medications    Prior to Admission medications   Medication Sig Start Date End Date Taking? Authorizing Provider  amphetamine-dextroamphetamine (ADDERALL XR) 15 MG 24 hr capsule Take 15 mg by mouth every morning.    [provider]  amphetamine-dextroamphetamine (ADDERALL) 10 MG tablet Take 10 mg by mouth every evening.  12/21/14   [provider]    ferrous sulfate 325 (65 FE) MG tablet Take 1 tablet (325 mg total) by mouth 2 (two) times daily with a meal. 08/01/17   Regalado, Belkys A, MD  fluticasone (FLONASE) 50 MCG/ACT nasal spray Place 2 sprays into both nostrils daily as needed for allergies.     [provider]  guaifenesin (ROBITUSSIN) 100 MG/5ML syrup Take 200 mg by mouth 3 (three) times daily as needed for cough.    [provider]  Hypromellose (ARTIFICIAL TEARS OP) Apply to eye.    [provider]  ibuprofen (ADVIL,MOTRIN) 200 MG tablet Take 400 mg by mouth every 6 (six) hours as needed for headache or mild pain.    [provider]  insulin glargine (LANTUS) 100 UNIT/ML injection Inject 0.18 mLs (18 Units total) into the skin daily. 08/02/17   Regalado, Belkys A, MD  magnesium oxide (MAG-OX) 400 (241.3 Mg) MG tablet Take 1 tablet (400 mg total) by mouth 2 (two) times daily. 08/02/17   Regalado, Belkys A, MD  olopatadine (PATANOL) 0.1 % ophthalmic solution Place 1 drop into the right eye daily. 06/08/17   [provider]  oxyCODONE (OXY IR/ROXICODONE) 5 MG immediate release tablet Take 1 tablet (5 mg total) by mouth every 6 (six) hours as needed for moderate pain. 08/01/17   Regalado, Belkys A, MD  potassium chloride SA (K-DUR,KLOR-CON) 20 MEQ tablet Take 2 tablets (40 mEq total) by mouth daily. 08/01/17   Regalado, Belkys A, MD  PROAIR HFA 108 (90 Base) MCG/ACT inhaler Inhale 2 puffs into the lungs every 4 (four) hours as needed for wheezing. 07/05/17   [provider]  propranolol (INDERAL) 10 MG tablet Take 10 mg by mouth 2 (two) times daily.    [provider]  Pseudoephedrine-APAP-DM (DAYQUIL MULTI-SYMPTOM COLD/FLU PO) Take 30 mLs by mouth daily as needed (cough).    [provider]  sertraline (ZOLOFT) 100 MG tablet Take 100 mg by mouth daily.    [provider]  sulfamethoxazole-trimethoprim (BACTRIM DS,SEPTRA DS) 800-160 MG tablet Take 1 tablet by  mouth 2 (two) times daily. 08/01/17   Regalado, Prentiss Bells, MD    Family History Family History  Problem Relation Age of Onset  . GER disease Mother     Social History Social History   Tobacco Use  . Smoking status: Current Every Day Smoker  . Smokeless tobacco: Never Used  Substance Use Topics  . Alcohol use: Yes    Comment: 1 fifth of liquor daily or more   . Drug use: No     Allergies   Vicodin [hydrocodone-acetaminophen]   Review of Systems Review of Systems  All other systems reviewed and are negative.    Physical Exam Updated Vital Signs BP (!) 139/107 (BP Location: Right Arm)   Pulse (!) 106   Temp 98.2 F (36.8 C) (Oral)   Resp 18   Ht 5\' 1"  (1.549 m)   Wt 68.9 kg (152 lb)   LMP 09/05/2017   SpO2 99%   BMI 28.72 kg/m   Physical Exam  Constitutional: She appears  well-developed and well-nourished.  HENT:  Head: Normocephalic and atraumatic.  Right Ear: External ear normal.  Left Ear: External ear normal.  Nose: Nose normal.  Mouth/Throat: Uvula is midline and oropharynx is clear and moist. Mucous membranes are dry. No tonsillar exudate.   No fruity breath noted on exam.   Eyes: Pupils are equal, round, and reactive to light. Right eye exhibits no discharge. Left eye exhibits no discharge. No scleral icterus.  Neck: Trachea normal. Neck supple. No spinous process tenderness present. No neck rigidity. Normal range of motion present.  Cardiovascular: Regular rhythm and intact distal pulses. Tachycardia present.  No murmur heard. Pulses:      Radial pulses are 2+ on the right side, and 2+ on the left side.       Dorsalis pedis pulses are 2+ on the right side, and 2+ on the left side.       Posterior tibial pulses are 2+ on the right side, and 2+ on the left side.  No lower extremity swelling or edema. Calves symmetric in size bilaterally.  Pulmonary/Chest: Effort normal and breath sounds normal. She exhibits no tenderness.  Abdominal: Soft. Bowel sounds  are normal. She exhibits no distension. There is hepatomegaly. There is no tenderness. There is no rigidity, no rebound, no guarding and no CVA tenderness.  No ascites  Musculoskeletal: She exhibits no edema.  Lymphadenopathy:    She has no cervical adenopathy.  Neurological: She is alert.  Mental Status:  Alert, oriented, thought content appropriate, able to give a coherent history. Speech slurred but without evidence of aphasia. Able to follow 2 step commands without difficulty.  Cranial Nerves:  II:  Peripheral visual fields grossly normal, pupils equal, round, reactive to light III,IV, VI: ptosis not present, extra-ocular motions intact bilaterally  V,VII: smile symmetric, eyebrows raise symmetric, facial light touch sensation equal VIII: hearing grossly normal to voice  X: uvula elevates symmetrically  XI: bilateral shoulder shrug symmetric and strong XII: midline tongue extension without fassiculations Motor:  Normal tone. 5/5 in upper and lower extremities bilaterally including strong and equal grip strength and dorsiflexion/plantar flexion Sensory: Sensation intact to light touch in all extremities. Negative Romberg.  Deep Tendon Reflexes: 2+ and symmetric in the biceps and patella Cerebellar: normal finger-to-nose with bilateral upper extremities. Normal heel-to -shin balance bilaterally of the lower extremity. No pronator drift.  Gait: normal gait and balance CV: distal pulses palpable throughout   Skin: Skin is warm and dry. No rash noted. She is not diaphoretic.  Psychiatric: She has a normal mood and affect.  Nursing note and vitals reviewed.    ED Treatments / Results  Labs (all labs ordered are listed, but only abnormal results are displayed) Labs Reviewed  BASIC METABOLIC PANEL - Abnormal; Notable for the following components:      Result Value   Potassium 3.2 (*)    Chloride 93 (*)    Glucose, Bld 179 (*)    Calcium 8.6 (*)    Anion gap 17 (*)    All other  components within normal limits  URINALYSIS, ROUTINE W REFLEX MICROSCOPIC - Abnormal; Notable for the following components:   Hgb urine dipstick SMALL (*)    Protein, ur >300 (*)    All other components within normal limits  URINALYSIS, MICROSCOPIC (REFLEX) - Abnormal; Notable for the following components:   Bacteria, UA RARE (*)    Squamous Epithelial / LPF 0-5 (*)    All other components within normal limits  RAPID  URINE DRUG SCREEN, HOSP PERFORMED - Abnormal; Notable for the following components:   Amphetamines POSITIVE (*)    All other components within normal limits  ETHANOL - Abnormal; Notable for the following components:   Alcohol, Ethyl (B) 417 (*)    All other components within normal limits  CBG MONITORING, ED - Abnormal; Notable for the following components:   Glucose-Capillary 187 (*)    All other components within normal limits  I-STAT VENOUS BLOOD GAS, ED - Abnormal; Notable for the following components:   pH, Ven 7.446 (*)    pCO2, Ven 38.6 (*)    pO2, Ven 75.0 (*)    All other components within normal limits  PREGNANCY, URINE  CBC WITH DIFFERENTIAL/PLATELET    EKG  EKG Interpretation  Date/Time:  Friday September 17 2017 16:52:44 EST Ventricular Rate:  104 PR Interval:    QRS Duration: 92 QT Interval:  372 QTC Calculation: 490 R Axis:   -13 Text Interpretation:  Sinus tachycardia Borderline prolonged QT interval Baseline wander in lead(s) V2 NO STEMI No old tracing to compare Confirmed by Drema Pry [phone]) on 09/17/2017 11:56:52 PM       Radiology No results found.  Procedures Procedures (including critical care time)  Medications Ordered in ED Medications  sodium chloride 0.9 % bolus 1,000 mL (0 mLs Intravenous Stopped 09/17/17 1838)  ondansetron (ZOFRAN) injection 4 mg (4 mg Intravenous Given 09/17/17 1710)  potassium chloride SA (K-DUR,KLOR-CON) CR tablet 30 mEq (30 mEq Oral Given 09/17/17 1729)     Initial Impression / Assessment and Plan / ED  Course  I have reviewed the triage vital signs and the nursing notes.  Pertinent labs & imaging results that were available during my care of the patient were reviewed by me and considered in my medical decision making (see chart for details).     27 y.o. female with a history of hypertension, alcohol abuse, alcoholic cirrhosis/hepatitis, insulin dependent DM who presents to the ED today for feeling disorientated. Patient was admitted on 07/26/17 for abscess/cellulitis of the face with + anion gap, hyperglycemia without acidosis. She was diagnosed with DM during admission with A1c of 11.5. She was discharged home on 18U of Lantus which she has been taking daily since d/c. Notes that her PCP recently increased the dose to 22U a few days ago.   She is presenting today for 1 week of "feeling disorientated". She notes most days she feels "out of it". Like her mind is foggy, she is tired all the time and something is wrong. Her husband notes that over the last 1 week he has smelled fruity odor on her breath.  They have not been taking her glucose regularly except over the last 3-4 days and note that it has been running roughly 200 with a peak of 340.  The patient is also a chronic alcohol user.  She typically drinks 1/5 of liquor per day.  She has had 6 shots of liquor this a.m., with the last drink at 10 AM.  She notes that she has been eating a low-carb diet since discharge.  He has had one episode of nonbilious, nonbloody emesis this morning.   On presentation the patient's vital signs are without fever, tachypnea, hypoxia or hypotension.  She is noted to be slightly tachycardic at 106.  She does have slurred speech on exam without any focal neurologic deficits.  Abdomen is soft and nontender.  There is no evidence of ascites. Do not suspect patient symptoms are  related to SBP given no abdominal TTP, fever, or distension/ascities.  Labs drawn in triage that the patient has elevation of glucose at 179 with  an anion gap of 17. Lower suspicion for DKA. Patient is not on any SGLT2 inhibitors. Suspect patient's symptoms and anion gap related to alcohol.  Will obtain ethanol level, UDS and VBG to confirm. Will obtain EKG for tachycardia.   EKG reassuring and with sinus tachycardia. Labs otherwise reassuring that were taken in triage.  No leukocytosis or anemia. Kidney function within normal limits.  Mild hypokalemia that was replaced orally.  No evidence of UTI and there is no ketones in the patient's urine.  VBG without evidence of acidosis.  Do not suspect DKA.  Alcohol level 417. Suspect this is the cause of the patients symptoms. Patient is ambulatory in the department. Feels improved after 1L of fluids and tolerating PO fluids. The evaluation does not show pathology that would require ongoing emergent intervention or inpatient treatment. I advised the patient to follow-up with PCP this week. They have an appointment on Tuesday at 345. I advised the patient to return to the emergency department with new or worsening symptoms or new concerns. Specific return precautions discussed. The patient verbalized understanding and agreement with plan. All questions answered. No further questions at this time. The patient is hemodynamically stable, mentating appropriately and appears safe for discharge.  Spent time to discuss monitoring of blood sugars, diabetic nutrition and provided handouts. Resources provided on alcohol programs.   Patient case discussed with Dr. Eudelia Bunch who is in agreement with plan.  Final Clinical Impressions(s) / ED Diagnoses   Final diagnoses:  Hyperglycemia  Elevated blood alcohol level, blood alcohol level not specified    ED Discharge Orders    None       Jacinto Halim, PA-C 09/17/17 2359    Jacinto Halim, PA-C 09/18/17 0000    Nira Conn, MD 09/18/17 0001

## 2017-09-17 NOTE — ED Triage Notes (Addendum)
Recent diagnosis of diabetes. Her last glucose was 211. Husband states her breath has smelled fruity for the past week or so. Vomited x 1 today. She is alert and oriented. She is shaky and her speech is slurred. Hx of daily alcohol use and admits to drinking 6 shots of liquor since this am.

## 2017-09-17 NOTE — Discharge Instructions (Signed)
Your tests and lab work was reassuring. I have attached handouts on diabetic nutrition as well as blood glucose monitoring.  Please follow information that we discussed.  Please follow-up with your PCP on Tuesday during her scheduled appointment. If you develop worsening or new concerning symptoms you can return to the emergency department for re-evaluation.

## 2017-09-17 NOTE — ED Notes (Addendum)
Pt admits to using a fifth of liquor per day. Pt does not check her sugar regularly but does report taking her insulin as prescribed. Pt has slurred speech. Pt spouse at bedside and supportive.

## 2017-09-17 NOTE — ED Notes (Signed)
ED Provider at bedside. 

## 2017-10-01 ENCOUNTER — Encounter (HOSPITAL_BASED_OUTPATIENT_CLINIC_OR_DEPARTMENT_OTHER): Payer: Self-pay | Admitting: Emergency Medicine

## 2017-10-01 ENCOUNTER — Other Ambulatory Visit: Payer: Self-pay

## 2017-10-01 ENCOUNTER — Emergency Department (HOSPITAL_BASED_OUTPATIENT_CLINIC_OR_DEPARTMENT_OTHER)
Admission: EM | Admit: 2017-10-01 | Discharge: 2017-10-01 | Disposition: A | Discharge status: Home / Self Care | Payer: BC Managed Care – PPO | Attending: Emergency Medicine | Admitting: Emergency Medicine

## 2017-10-01 DIAGNOSIS — E119 Type 2 diabetes mellitus without complications: Secondary | ICD-10-CM | POA: Diagnosis not present

## 2017-10-01 DIAGNOSIS — R251 Tremor, unspecified: Secondary | ICD-10-CM | POA: Diagnosis present

## 2017-10-01 DIAGNOSIS — F1010 Alcohol abuse, uncomplicated: Secondary | ICD-10-CM | POA: Insufficient documentation

## 2017-10-01 DIAGNOSIS — I10 Essential (primary) hypertension: Secondary | ICD-10-CM | POA: Insufficient documentation

## 2017-10-01 DIAGNOSIS — F1090 Alcohol use, unspecified, uncomplicated: Secondary | ICD-10-CM

## 2017-10-01 DIAGNOSIS — J45909 Unspecified asthma, uncomplicated: Secondary | ICD-10-CM | POA: Diagnosis not present

## 2017-10-01 DIAGNOSIS — F17210 Nicotine dependence, cigarettes, uncomplicated: Secondary | ICD-10-CM | POA: Diagnosis not present

## 2017-10-01 DIAGNOSIS — Z79899 Other long term (current) drug therapy: Secondary | ICD-10-CM | POA: Diagnosis not present

## 2017-10-01 DIAGNOSIS — Z794 Long term (current) use of insulin: Secondary | ICD-10-CM | POA: Diagnosis not present

## 2017-10-01 DIAGNOSIS — Z7289 Other problems related to lifestyle: Secondary | ICD-10-CM

## 2017-10-01 LAB — COMPREHENSIVE METABOLIC PANEL
ALT: 18 U/L (ref 14–54)
AST: 114 U/L — AB (ref 15–41)
Albumin: 4.1 g/dL (ref 3.5–5.0)
Alkaline Phosphatase: 114 U/L (ref 38–126)
Anion gap: 15 (ref 5–15)
BILIRUBIN TOTAL: 1.2 mg/dL (ref 0.3–1.2)
BUN: 7 mg/dL (ref 6–20)
CO2: 23 mmol/L (ref 22–32)
CREATININE: 0.59 mg/dL (ref 0.44–1.00)
Calcium: 7.4 mg/dL — ABNORMAL LOW (ref 8.9–10.3)
Chloride: 91 mmol/L — ABNORMAL LOW (ref 101–111)
GFR calc Af Amer: >60 mL/min (ref >60)
Glucose, Bld: 121 mg/dL — ABNORMAL HIGH (ref 65–99)
POTASSIUM: 3.5 mmol/L (ref 3.5–5.1)
Sodium: 129 mmol/L — ABNORMAL LOW (ref 135–145)
TOTAL PROTEIN: 8.5 g/dL — AB (ref 6.5–8.1)

## 2017-10-01 LAB — RAPID URINE DRUG SCREEN, HOSP PERFORMED
Amphetamines: NOT DETECTED
BARBITURATES: NOT DETECTED
Benzodiazepines: POSITIVE — AB
Cocaine: NOT DETECTED
OPIATES: NOT DETECTED
TETRAHYDROCANNABINOL: NOT DETECTED

## 2017-10-01 LAB — CBC WITH DIFFERENTIAL/PLATELET
BASOS ABS: 0 10*3/uL (ref 0.0–0.1)
BASOS PCT: 0 %
Eosinophils Absolute: 0.2 10*3/uL (ref 0.0–0.7)
Eosinophils Relative: 3 %
HEMATOCRIT: 38.1 % (ref 36.0–46.0)
Hemoglobin: 12.9 g/dL (ref 12.0–15.0)
Lymphocytes Relative: 16 %
Lymphs Abs: 1.3 10*3/uL (ref 0.7–4.0)
MCH: 28.7 pg (ref 26.0–34.0)
MCHC: 33.9 g/dL (ref 30.0–36.0)
MCV: 84.7 fL (ref 78.0–100.0)
Monocytes Absolute: 0.6 10*3/uL (ref 0.1–1.0)
Monocytes Relative: 7 %
NEUTROS ABS: 6.3 10*3/uL (ref 1.7–7.7)
NEUTROS PCT: 74 %
Platelets: 124 10*3/uL — ABNORMAL LOW (ref 150–400)
RBC: 4.5 MIL/uL (ref 3.87–5.11)
RDW: 13.3 % (ref 11.5–15.5)
WBC: 8.3 10*3/uL (ref 4.0–10.5)

## 2017-10-01 LAB — ETHANOL: Alcohol, Ethyl (B): <10 mg/dL (ref <10)

## 2017-10-01 LAB — PREGNANCY, URINE: Preg Test, Ur: NEGATIVE

## 2017-10-01 LAB — SALICYLATE LEVEL

## 2017-10-01 LAB — CBG MONITORING, ED: Glucose-Capillary: 125 mg/dL — ABNORMAL HIGH (ref 65–99)

## 2017-10-01 LAB — ACETAMINOPHEN LEVEL: Acetaminophen (Tylenol), Serum: <10 ug/mL — ABNORMAL LOW (ref 10–30)

## 2017-10-01 MED ORDER — SODIUM CHLORIDE 0.9 % IV SOLN: INTRAVENOUS | Status: DC

## 2017-10-01 MED ORDER — MAGNESIUM SULFATE 50 % IJ SOLN
INTRAMUSCULAR | Status: AC
  Filled 2017-10-01: qty 4

## 2017-10-01 MED ORDER — SODIUM CHLORIDE 0.9 % IV BOLUS (SEPSIS)
1000.0000 mL | Freq: Once | INTRAVENOUS | Status: AC
  Administered 2017-10-01: 1000 mL via INTRAVENOUS

## 2017-10-01 MED ORDER — LORAZEPAM 2 MG/ML IJ SOLN
1.0000 mg | Freq: Once | INTRAMUSCULAR | Status: AC
  Administered 2017-10-01: 1 mg via INTRAVENOUS
  Filled 2017-10-01: qty 1

## 2017-10-01 MED ORDER — MAGNESIUM SULFATE 50 % IJ SOLN
INTRAMUSCULAR | Status: AC
  Administered 2017-10-01: 2 g via INTRAVENOUS
  Filled 2017-10-01: qty 2

## 2017-10-01 MED ORDER — THIAMINE HCL 100 MG/ML IJ SOLN
INTRAMUSCULAR | Status: AC
  Administered 2017-10-01: 100 mg
  Filled 2017-10-01: qty 2

## 2017-10-01 MED ORDER — KETOROLAC TROMETHAMINE 15 MG/ML IJ SOLN
15.0000 mg | Freq: Once | INTRAMUSCULAR | Status: AC
  Administered 2017-10-01: 15 mg via INTRAVENOUS
  Filled 2017-10-01: qty 1

## 2017-10-01 MED ORDER — THIAMINE HCL 100 MG/ML IJ SOLN
Freq: Once | INTRAVENOUS | Status: AC
  Administered 2017-10-01: 05:00:00 via INTRAVENOUS
  Filled 2017-10-01: qty 1000

## 2017-10-01 MED ORDER — CARBAMAZEPINE 200 MG PO TABS: ORAL_TABLET | ORAL | 0 refills | Status: DC

## 2017-10-01 MED ORDER — THIAMINE HCL 100 MG/ML IJ SOLN
Freq: Once | INTRAVENOUS | Status: DC
  Filled 2017-10-01: qty 1000

## 2017-10-01 NOTE — ED Provider Notes (Signed)
[facility] EMERGENCY DEPARTMENT Provider Note   CSN: [id number] Arrival date & time: 10/01/17  4098     History   Chief Complaint Chief Complaint  Patient presents with  . Alcohol Problem    HPI Angela Villegas is a 27 y.o. female.  The history is provided by the patient.  Alcohol Problem  This is a new problem. The current episode started more than 2 days ago. The problem occurs constantly. The problem has not changed since onset.Pertinent negatives include no chest pain, no abdominal pain, no headaches and no shortness of breath. Nothing aggravates the symptoms. Nothing relieves the symptoms. She has tried nothing for the symptoms. The treatment provided no relief.  Presents with tremors.  Has been off alcohol since Tuesday.  Is using ativan taper but not feeling better.  No f/c/r.  No AH or VH.  No emesis.  No f/c/r.    Past Medical History:  Diagnosis Date  . ADD (attention deficit disorder)   . Alcohol abuse   . Asthma   . Diabetes mellitus without complication (HCC)   . Hypertension   . Liver disease, unspecified     Patient Active Problem List   Diagnosis Date Noted  . Pyomyositis of neck 07/27/2017  . Injury (inflammation/mass effect) of right internal jugular vein 07/27/2017  . Diabetes mellitus, new onset (HCC) 07/27/2017  . 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 Thrombocytopenia with coagulopathy 07/27/2017  . Hypertension 07/27/2017  . Asthma 07/27/2017  . ADD (attention deficit disorder) 07/27/2017  . 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 Acute hypokalemia 07/27/2017  . Cellulitis and abscess of neck 07/26/2017  . Alcoholic hepatitis with ascites 12/09/2015  . Hepatic failure (HCC) 12/06/2015  . Ascites 12/06/2015  . Alcoholic hepatitis 12/06/2015  . Coagulopathy (HCC) 12/06/2015  . Alcohol abuse 12/06/2015  . Acute alcohol intoxication (HCC)     Past Surgical History:  Procedure Laterality Date  . EXCISION MASS NECK Right 07/28/2017     Procedure: exploration of right neck control of hemorrhage;  Surgeon: Osborn Coho, MD;  Location: [facility] OR;  Service: ENT;  Laterality: Right;  . INCISION AND DRAINAGE ABSCESS Right 07/27/2017   Procedure: INCISION AND DRAINAGE ABSCESS;  Surgeon: Osborn Coho, MD;  Location: [facility] OR;  Service: ENT;  Laterality: Right;    OB History   None      Home Medications    Prior to Admission medications   Medication Sig Start Date End Date Taking? Authorizing Provider  amphetamine-dextroamphetamine (ADDERALL XR) 15 MG 24 hr capsule Take 15 mg by mouth every morning.    [provider]  amphetamine-dextroamphetamine (ADDERALL) 10 MG tablet Take 10 mg by mouth every evening.  12/21/14   [provider]  ferrous sulfate 325 (65 FE) MG tablet Take 1 tablet (325 mg total) by mouth 2 (two) times daily with a meal. 08/01/17   Regalado, Belkys A, MD  fluticasone (FLONASE) 50 MCG/ACT nasal spray Place 2 sprays into both nostrils daily as needed for allergies.     [provider]  guaifenesin (ROBITUSSIN) 100 MG/5ML syrup Take 200 mg by mouth 3 (three) times daily as needed for cough.    [provider]  Hypromellose (ARTIFICIAL TEARS OP) Apply to eye.    [provider]  ibuprofen (ADVIL,MOTRIN) 200 MG tablet Take 400 mg by mouth every 6 (six) hours as needed for headache or mild pain.    [provider]  insulin glargine (LANTUS) 100 UNIT/ML injection Inject 0.18 mLs (  18 Units total) into the skin daily. 08/02/17   Regalado, Belkys A, MD  magnesium oxide (MAG-OX) 400 (241.3 Mg) MG tablet Take 1 tablet (400 mg total) by mouth 2 (two) times daily. 08/02/17   Regalado, Belkys A, MD  olopatadine (PATANOL) 0.1 % ophthalmic solution Place 1 drop into the right eye daily. 06/08/17   [provider]  oxyCODONE (OXY IR/ROXICODONE) 5 MG immediate release tablet Take 1 tablet (5 mg total) by mouth every 6 (six) hours as needed for moderate pain. 08/01/17    Regalado, Belkys A, MD  potassium chloride SA (K-DUR,KLOR-CON) 20 MEQ tablet Take 2 tablets (40 mEq total) by mouth daily. 08/01/17   Regalado, Belkys A, MD  PROAIR HFA 108 (90 Base) MCG/ACT inhaler Inhale 2 puffs into the lungs every 4 (four) hours as needed for wheezing. 07/05/17   [provider]  propranolol (INDERAL) 10 MG tablet Take 10 mg by mouth 2 (two) times daily.    [provider]  Pseudoephedrine-APAP-DM (DAYQUIL MULTI-SYMPTOM COLD/FLU PO) Take 30 mLs by mouth daily as needed (cough).    [provider]  sertraline (ZOLOFT) 100 MG tablet Take 100 mg by mouth daily.    [provider]  sulfamethoxazole-trimethoprim (BACTRIM DS,SEPTRA DS) 800-160 MG tablet Take 1 tablet by mouth 2 (two) times daily. 08/01/17   Regalado, Prentiss BellsBelkys A, MD    Family History Family History  Problem Relation Age of Onset  . GER disease Mother     Social History Social History   Tobacco Use  . Smoking status: Current Every Day Smoker  . Smokeless tobacco: Never Used  Substance Use Topics  . Alcohol use: Yes    Comment: 1 fifth of liquor daily or more   . Drug use: No     Allergies   Vicodin [hydrocodone-acetaminophen]   Review of Systems Review of Systems  Constitutional: Negative for fever.  Respiratory: Negative for shortness of breath.   Cardiovascular: Negative for chest pain and palpitations.  Gastrointestinal: Negative for abdominal pain and vomiting.  Neurological: Positive for tremors. Negative for headaches.  Psychiatric/Behavioral: Negative for hallucinations.  All other systems reviewed and are negative.    Physical Exam Updated Vital Signs BP (!) 172/115 (BP Location: Right Arm)   Pulse (!) 103   Temp 98.1 F (36.7 C) (Oral)   Resp 20   Ht 5\' 1"  (1.549 m)   Wt 68.9 kg (152 lb)   LMP 09/05/2017   SpO2 100%   BMI 28.72 kg/m   Physical Exam  Constitutional: She is oriented to person, place, and time. She appears well-developed and  well-nourished.  HENT:  Head: Normocephalic and atraumatic.  Mouth/Throat: No oropharyngeal exudate.  Eyes: Pupils are equal, round, and reactive to light. Conjunctivae are normal.  Neck: Normal range of motion. Neck supple. No thyromegaly present.  Cardiovascular: Regular rhythm. Tachycardia present.  Pulmonary/Chest: Effort normal and breath sounds normal. No stridor. She has no wheezes. She has no rales.  Abdominal: Soft. Bowel sounds are normal. She exhibits no mass. There is no tenderness. There is no rebound and no guarding.  Musculoskeletal: Normal range of motion.  Neurological: She is alert and oriented to person, place, and time. She displays normal reflexes.  tremor  Skin: Skin is warm and dry. Capillary refill takes less than 2 seconds. She is not diaphoretic.  Psychiatric: Thought content normal.     ED Treatments / Results  Labs (all labs ordered are listed, but only abnormal results are displayed) Results  for orders placed or performed during the hospital encounter of 10/01/17  Comprehensive metabolic panel  Result Value Ref Range   Sodium 129 (L) 135 - 145 mmol/L   Potassium 3.5 3.5 - 5.1 mmol/L   Chloride 91 (L) 101 - 111 mmol/L   CO2 23 22 - 32 mmol/L   Glucose, Bld 121 (H) 65 - 99 mg/dL   BUN 7 6 - 20 mg/dL   Creatinine, Ser 1.61 0.44 - 1.00 mg/dL   Calcium 7.4 (L) 8.9 - 10.3 mg/dL   Total Protein 8.5 (H) 6.5 - 8.1 g/dL   Albumin 4.1 3.5 - 5.0 g/dL   AST 096 (H) 15 - 41 U/L   ALT 18 14 - 54 U/L   Alkaline Phosphatase 114 38 - 126 U/L   Total Bilirubin 1.2 0.3 - 1.2 mg/dL   GFR calc non Af Amer >60 >60 mL/min   GFR calc Af Amer >60 >60 mL/min   Anion gap 15 5 - 15  Salicylate level  Result Value Ref Range   Salicylate Lvl <7.0 2.8 - 30.0 mg/dL  Acetaminophen level  Result Value Ref Range   Acetaminophen (Tylenol), Serum <10 (L) 10 - 30 ug/mL  Ethanol  Result Value Ref Range   Alcohol, Ethyl (B) <10 <10 mg/dL  CBC WITH DIFFERENTIAL  Result Value Ref  Range   WBC 8.3 4.0 - 10.5 K/uL   RBC 4.50 3.87 - 5.11 MIL/uL   Hemoglobin 12.9 12.0 - 15.0 g/dL   HCT 04.5 40.9 - 81.1 %   MCV 84.7 78.0 - 100.0 fL   MCH 28.7 26.0 - 34.0 pg   MCHC 33.9 30.0 - 36.0 g/dL   RDW 91.4 78.2 - 95.6 %   Platelets 124 (L) 150 - 400 K/uL   Neutrophils Relative % 74 %   Neutro Abs 6.3 1.7 - 7.7 K/uL   Lymphocytes Relative 16 %   Lymphs Abs 1.3 0.7 - 4.0 K/uL   Monocytes Relative 7 %   Monocytes Absolute 0.6 0.1 - 1.0 K/uL   Eosinophils Relative 3 %   Eosinophils Absolute 0.2 0.0 - 0.7 K/uL   Basophils Relative 0 %   Basophils Absolute 0.0 0.0 - 0.1 K/uL  CBG monitoring, ED  Result Value Ref Range   Glucose-Capillary 125 (H) 65 - 99 mg/dL   No results found.  EKG  EKG Interpretation  Date/Time:  Friday October 01 2017 03:46:35 EDT Ventricular Rate:  103 PR Interval:    QRS Duration: 90 QT Interval:  379 QTC Calculation: 497 R Axis:   -8 Text Interpretation:  Sinus tachycardia Probable left ventricular hypertrophy Confirmed by Nicanor Alcon, Aleisa Howk ([id number]) on 10/01/2017 5:18:04 AM        Radiology No results found.  Procedures Procedures (including critical care time)  Medications Ordered in ED Medications  sodium chloride 0.9 % bolus 1,000 mL (1,000 mLs Intravenous New Bag/Given 10/01/17 0359)    And  sodium chloride 0.9 % bolus 1,000 mL (has no administration in time range)    And  0.9 %  sodium chloride infusion (has no administration in time range)  sodium chloride 0.9 % 1,000 mL with thiamine 100 mg, folic acid 1 mg, multivitamins adult 10 mL, magnesium sulfate 2 g infusion (has no administration in time range)  LORazepam (ATIVAN) injection 1 mg (has no administration in time range)      Final Clinical Impressions(s) / ED Diagnoses  No DTs clinically.    Will change to a tegretol taper.  Follow up with your PMD and given outpatient resources.    Return for weakness, numbness, changes in vision or speech, fevers >100.4 unrelieved by  medication, shortness of breath, intractable vomiting, or diarrhea, abdominal pain, Inability to tolerate liquids or food, cough, altered mental status or any concerns. No signs of systemic illness or infection. The patient is nontoxic-appearing on exam and vital signs are within normal limits.   I have reviewed the triage vital signs and the nursing notes. Pertinent labs &imaging results that were available during my care of the patient were reviewed by me and considered in my medical decision making (see chart for details).  After history, exam, and medical workup I feel the patient has been appropriately medically screened and is safe for discharge home. Pertinent diagnoses were discussed with the patient. Patient was given return precautions.    Nicol Herbig, MD 10/01/17 [phone]

## 2017-10-01 NOTE — ED Triage Notes (Signed)
Pt has been doing alcohol detox x 1 week with lorazepam. Pt reports increased tremors tonight and significant other states pt was having auditory hallucinations tonight. Pt states last drink was 3/19 and was prescribed lorazepam from the [facility].

## 2017-10-01 NOTE — ED Notes (Signed)
Attempted IV in right FA unsuccessful.  

## 2017-10-01 NOTE — ED Notes (Signed)
Pt ambulatory to bathroom with assistance.

## 2018-01-20 ENCOUNTER — Encounter: Payer: BC Managed Care – PPO | Attending: Endocrinology

## 2018-01-20 DIAGNOSIS — Z713 Dietary counseling and surveillance: Secondary | ICD-10-CM | POA: Insufficient documentation

## 2018-01-20 DIAGNOSIS — E119 Type 2 diabetes mellitus without complications: Secondary | ICD-10-CM | POA: Insufficient documentation

## 2018-01-20 NOTE — Progress Notes (Signed)
Patient was seen on 01/20/18 for the first of a series of three diabetes self-management courses at the [facility].  Patient Education Plan per assessed needs and concerns is to attend three course education program for Diabetes Self Management Education.  The following learning objectives were met by the patient during this class:  Describe diabetes  State some common risk factors for diabetes  Defines the role of glucose and insulin  Identifies type of diabetes and pathophysiology  Describe the relationship between diabetes and cardiovascular risk  State the members of the Healthcare Team  States the rationale for glucose monitoring  State when to test glucose  State their individual Target Range  State the importance of logging glucose readings  Describe how to interpret glucose readings  Identifies A1C target  Explain the correlation between A1c and eAG values  State symptoms and treatment of high blood glucose  State symptoms and treatment of low blood glucose  Explain proper technique for glucose testing  Identifies proper sharps disposal  Handouts given during class include:  ADA Diabetes You Take Control   Carb Counting and Meal Planning book  Meal Plan Card  Meal planning worksheet  Low Sodium Flavoring Tips  Types of Fats  The diabetes portion plate  A1c to eAG Conversion Chart  Diabetes Recommended Care Schedule  Support Group  Diabetes Success Plan  Core Class Satisfaction Survey   Follow-Up Plan:  Attend core 2   

## 2018-01-27 DIAGNOSIS — Z713 Dietary counseling and surveillance: Secondary | ICD-10-CM | POA: Diagnosis not present

## 2018-01-27 DIAGNOSIS — E119 Type 2 diabetes mellitus without complications: Secondary | ICD-10-CM

## 2018-01-27 NOTE — Progress Notes (Signed)
Patient was seen on 01/27/18 for the second of a series of three diabetes self-management courses at the [facility]. The following learning objectives were met by the patient during this class:   Describe the role of different macronutrients on glucose  Explain how carbohydrates affect blood glucose  State what foods contain the most carbohydrates  Demonstrate carbohydrate counting  Demonstrate how to read Nutrition Facts food label  Describe effects of various fats on heart health  Describe the importance of good nutrition for health and healthy eating strategies  Describe techniques for managing your shopping, cooking and meal planning  List strategies to follow meal plan when dining out  Describe the effects of alcohol on glucose and how to use it safely  Goals:  Follow Diabetes Meal Plan as instructed  Aim to spread carbs evenly throughout the day  Aim for 3 meals per day and snacks as needed Include lean protein foods to meals/snacks  Monitor glucose levels as instructed by your doctor   Follow-Up Plan:  Attend Core 3  Work towards following your personal food plan.

## 2018-02-03 ENCOUNTER — Ambulatory Visit: Payer: BC Managed Care – PPO

## 2018-06-10 ENCOUNTER — Emergency Department (HOSPITAL_BASED_OUTPATIENT_CLINIC_OR_DEPARTMENT_OTHER)
Admission: EM | Admit: 2018-06-10 | Discharge: 2018-06-11 | Disposition: A | Discharge status: Home / Self Care | Payer: BC Managed Care – PPO | Attending: Emergency Medicine | Admitting: Emergency Medicine

## 2018-06-10 ENCOUNTER — Emergency Department (HOSPITAL_BASED_OUTPATIENT_CLINIC_OR_DEPARTMENT_OTHER): Payer: BC Managed Care – PPO

## 2018-06-10 ENCOUNTER — Other Ambulatory Visit: Payer: Self-pay

## 2018-06-10 ENCOUNTER — Encounter (HOSPITAL_BASED_OUTPATIENT_CLINIC_OR_DEPARTMENT_OTHER): Payer: Self-pay | Admitting: Emergency Medicine

## 2018-06-10 DIAGNOSIS — J45909 Unspecified asthma, uncomplicated: Secondary | ICD-10-CM | POA: Diagnosis not present

## 2018-06-10 DIAGNOSIS — R0602 Shortness of breath: Secondary | ICD-10-CM | POA: Insufficient documentation

## 2018-06-10 DIAGNOSIS — J069 Acute upper respiratory infection, unspecified: Secondary | ICD-10-CM | POA: Insufficient documentation

## 2018-06-10 DIAGNOSIS — F1010 Alcohol abuse, uncomplicated: Secondary | ICD-10-CM

## 2018-06-10 DIAGNOSIS — E109 Type 1 diabetes mellitus without complications: Secondary | ICD-10-CM | POA: Diagnosis not present

## 2018-06-10 DIAGNOSIS — R112 Nausea with vomiting, unspecified: Secondary | ICD-10-CM | POA: Diagnosis present

## 2018-06-10 DIAGNOSIS — I10 Essential (primary) hypertension: Secondary | ICD-10-CM | POA: Insufficient documentation

## 2018-06-10 DIAGNOSIS — B9789 Other viral agents as the cause of diseases classified elsewhere: Secondary | ICD-10-CM

## 2018-06-10 DIAGNOSIS — Z79899 Other long term (current) drug therapy: Secondary | ICD-10-CM | POA: Insufficient documentation

## 2018-06-10 DIAGNOSIS — F17210 Nicotine dependence, cigarettes, uncomplicated: Secondary | ICD-10-CM | POA: Insufficient documentation

## 2018-06-10 LAB — CBG MONITORING, ED: Glucose-Capillary: 172 mg/dL — ABNORMAL HIGH (ref 70–99)

## 2018-06-10 MED ORDER — ONDANSETRON HCL 4 MG/2ML IJ SOLN
4.0000 mg | Freq: Once | INTRAMUSCULAR | Status: AC
  Administered 2018-06-10: 4 mg via INTRAVENOUS
  Filled 2018-06-10: qty 2

## 2018-06-10 MED ORDER — SODIUM CHLORIDE 0.9 % IV BOLUS (SEPSIS)
1000.0000 mL | Freq: Once | INTRAVENOUS | Status: AC
  Administered 2018-06-10: 1000 mL via INTRAVENOUS

## 2018-06-10 NOTE — ED Triage Notes (Addendum)
Reports shortness of breath, vomiting x 1 week.  Reports history of diabetes.  States that "my sugar has been all over the place".   Currently drinks 1/5 of liquor daily.  Last drink today.  States she also has thoughts of self harm but would never kill herself.  States she has a therapist she follows with.

## 2018-06-10 NOTE — ED Provider Notes (Signed)
TIME SEEN: 11:10 PM  CHIEF COMPLAINT: Nausea, vomiting, shortness of breath, cough, wheezing  HPI: Patient is a 27 year old female with history of asthma, insulin-dependent diabetes, hypertension, alcohol abuse who presents to the emergency department with 1 week of intermittent nausea, vomiting, upper abdominal discomfort, cough with yellow sputum production, wheezing, shortness of breath.  States she is drinking 1/5 of liquor every day and has been doing so for the past 2 months.  Does have history of withdrawal, tremors but no history of DTs or seizures.  Last drank alcohol about 1 hour ago.  States that she has not been checking her sugar regularly.  She is on long-acting insulin 24 units daily but no other medications.  She denies any fevers, diarrhea, dysuria, hematuria, vaginal bleeding, discharge.  Last menstrual period was last week.  No sick contact or recent travel.  Has had paracentesis previously x2 but no other abdominal surgery.  Reports she has been vomiting about 3 times every day.  Decreased oral intake.  She also reports having fleeting thoughts of self-harm.  No thoughts of suicidal ideation or homicidal ideation.  She is currently seeing a therapist.  ROS: See HPI Constitutional: no fever  Eyes: no drainage  ENT: no runny nose   Cardiovascular:  no chest pain  Resp:  SOB  GI:  vomiting GU: no dysuria Integumentary: no rash  Allergy: no hives  Musculoskeletal: no leg swelling  Neurological: no slurred speech ROS otherwise negative  PAST MEDICAL HISTORY/PAST SURGICAL HISTORY:  Past Medical History:  Diagnosis Date  . ADD (attention deficit disorder)   . Alcohol abuse   . Asthma   . Diabetes mellitus without complication (HCC)   . Hypertension   . Liver disease, unspecified     MEDICATIONS:  Prior to Admission medications   Medication Sig Start Date End Date Taking? Authorizing Provider  amphetamine-dextroamphetamine (ADDERALL XR) 15 MG 24 hr capsule Take 15 mg  by mouth every morning.    [provider]  amphetamine-dextroamphetamine (ADDERALL) 10 MG tablet Take 10 mg by mouth every evening.  12/21/14   [provider]  carbamazepine (TEGRETOL) 200 MG tablet 800mg  PO QD X 1D, then 600mg  PO QD X 1D, then 400mg  QD X 1D, then 200mg  PO QD X 2D 10/01/17   Palumbo, April, MD  ferrous sulfate 325 (65 FE) MG tablet Take 1 tablet (325 mg total) by mouth 2 (two) times daily with a meal. 08/01/17   Regalado, Belkys A, MD  fluticasone (FLONASE) 50 MCG/ACT nasal spray Place 2 sprays into both nostrils daily as needed for allergies.     [provider]  guaifenesin (ROBITUSSIN) 100 MG/5ML syrup Take 200 mg by mouth 3 (three) times daily as needed for cough.    [provider]  Hypromellose (ARTIFICIAL TEARS OP) Apply to eye.    [provider]  ibuprofen (ADVIL,MOTRIN) 200 MG tablet Take 400 mg by mouth every 6 (six) hours as needed for headache or mild pain.    [provider]  insulin glargine (LANTUS) 100 UNIT/ML injection Inject 0.18 mLs (18 Units total) into the skin daily. 08/02/17   Regalado, Belkys A, MD  magnesium oxide (MAG-OX) 400 (241.3 Mg) MG tablet Take 1 tablet (400 mg total) by mouth 2 (two) times daily. 08/02/17   Regalado, Belkys A, MD  olopatadine (PATANOL) 0.1 % ophthalmic solution Place 1 drop into the right eye daily. 06/08/17   [provider]  oxyCODONE (OXY IR/ROXICODONE) 5 MG immediate release tablet Take  1 tablet (5 mg total) by mouth every 6 (six) hours as needed for moderate pain. 08/01/17   Regalado, Belkys A, MD  potassium chloride SA (K-DUR,KLOR-CON) 20 MEQ tablet Take 2 tablets (40 mEq total) by mouth daily. 08/01/17   Regalado, Belkys A, MD  PROAIR HFA 108 (90 Base) MCG/ACT inhaler Inhale 2 puffs into the lungs every 4 (four) hours as needed for wheezing. 07/05/17   [provider]  propranolol (INDERAL) 10 MG tablet Take 10 mg by mouth 2 (two) times daily.    [provider]  Pseudoephedrine-APAP-DM (DAYQUIL MULTI-SYMPTOM COLD/FLU PO) Take 30 mLs by mouth daily as needed (cough).    [provider]  sertraline (ZOLOFT) 100 MG tablet Take 100 mg by mouth daily.    [provider]  sulfamethoxazole-trimethoprim (BACTRIM DS,SEPTRA DS) 800-160 MG tablet Take 1 tablet by mouth 2 (two) times daily. 08/01/17   Regalado, Prentiss Bells, MD    ALLERGIES:  Allergies  Allergen Reactions  . Vicodin [Hydrocodone-Acetaminophen] Itching and Nausea Only    SOCIAL HISTORY:  Social History   Tobacco Use  . Smoking status: Current Every Day Smoker    Packs/day: 1.00    Types: Cigarettes  . Smokeless tobacco: Never Used  Substance Use Topics  . Alcohol use: Yes    Comment: 1 fifth of liquor daily or more     FAMILY HISTORY: Family History  Problem Relation Age of Onset  . GER disease Mother     EXAM: BP (!) 135/101   Pulse 92   Temp 97.6 F (36.4 C) (Oral)   Resp 20   Ht 5\' 1"  (1.549 m)   Wt 72.6 kg   LMP 06/10/2018 (Approximate)   SpO2 97%   BMI 30.23 kg/m  CONSTITUTIONAL: Alert and oriented and responds appropriately to questions.  Appears chronically ill HEAD: Normocephalic EYES: Conjunctivae clear, pupils appear equal, EOMI ENT: normal nose; slightly dry mucous membranes NECK: Supple, no meningismus, no nuchal rigidity, no LAD  CARD: RRR; S1 and S2 appreciated; no murmurs, no clicks, no rubs, no gallops RESP: Normal chest excursion without splinting or tachypnea; breath sounds clear and equal bilaterally; no wheezes, no rhonchi, no rales, no hypoxia or respiratory distress, speaking full sentences ABD/GI: Normal bowel sounds; non-distended; soft, only tender in the upper abdominal region, no rebound, no guarding, no peritoneal signs, no fluid wave appreciated BACK:  The back appears normal and is non-tender to palpation, there is no CVA tenderness EXT: Normal ROM in all joints; non-tender to palpation; no edema; normal  capillary refill; no cyanosis, no calf tenderness or swelling    SKIN: Normal color for age and race; warm; no rash NEURO: Moves all extremities equally, no tremors, no asterixis, normal gait, normal speech PSYCH: The patient's mood and manner are appropriate. Grooming and personal hygiene are appropriate.  She denies SI, HI or hallucinations.  MEDICAL DECISION MAKING: Patient here with cough, wheezing and shortness of breath.  Lungs currently clear but will obtain chest x-ray.  Also complaining of intermittent vomiting for the past week.  Does appear slightly dry on exam.  Will give IV fluids, Zofran for symptomatic relief.  Abdominal exam is benign.  She does not appear to have large volume ascites or SBP.  I do not think that she needs emergent imaging of her abdomen at this time.  Will obtain labs, urine.  Blood sugar is currently 172.  DKA is also on the differential.   Patient also reports fleeting thoughts of  self-harm but no active SI, HI.  She is able to contract for safety and has close outpatient follow-up.  I do not feel that she needs emergent psychiatric evaluation at this time and she agrees.  Partner at bedside does not voice any emergent safety concerns.   ED PROGRESS: Patient's labs show elevation of her AST and lipase.  Suspect this was from her alcohol abuse.  She is not in DKA at this time.  Normal bicarb and anion gap.  No coagulopathy.  Alcohol level is 424 here in the ED.  We will continue to monitor patient and give second liter of IV fluids.  She reports nausea has improved.  Will encourage oral fluids.  Chest x-ray shows acute bronchiolitis without pneumonia.  Plan is to continue to monitor patient in the ED and likely discharge home with her significant other with outpatient resources for detox/rehabilitation.   She has received 2 L of IV fluids and IV thiamine.  Has been able to drink here.  He does not have slurred speech, is able to ambulate without assistance and does  not appear significantly intoxicated.  Her significant other is here and will drive her home.  Will provide with outpatient resources and Tegretol taper for alcohol withdrawal.  She reports complications with benzodiazepine taper in the past where has caused her to be too drowsy where she could not ambulate.  I discussed with her that I feel that benzodiazepine taper needs to be done under close medical supervision given these previous side effects.  She states she feels that she is having some withdrawal symptoms currently and appears slightly anxious and mildly tremulous.  No hypertension, tachycardia, vomiting, hallucinations, seizure.  Will start Tegretol now.   At this time, I do not feel there is any life-threatening condition present. I have reviewed and discussed all results (EKG, imaging, lab, urine as appropriate) and exam findings with patient/family. I have reviewed nursing notes and appropriate previous records.  I feel the patient is safe to be discharged home without further emergent workup and can continue workup as an outpatient as needed. Discussed usual and customary return precautions. Patient/family verbalize understanding and are comfortable with this plan.  Outpatient follow-up has been provided if needed. All questions have been answered.    Lazara Grieser, Layla MawKristen N, DO 06/11/18 0206

## 2018-06-10 NOTE — ED Notes (Signed)
Pt states having problem with alcohol, last drink was several hours ago. No thoughts of self harm. Pt cooperative, 1 family member at bedside. C/o nausea no vomiting and mildly anxious.

## 2018-06-10 NOTE — ED Notes (Signed)
Patient transported to X-ray 

## 2018-06-11 LAB — URINALYSIS, MICROSCOPIC (REFLEX)

## 2018-06-11 LAB — COMPREHENSIVE METABOLIC PANEL
ALT: 41 U/L (ref 0–44)
AST: 226 U/L — ABNORMAL HIGH (ref 15–41)
Albumin: 4.1 g/dL (ref 3.5–5.0)
Alkaline Phosphatase: 97 U/L (ref 38–126)
Anion gap: 15 (ref 5–15)
BUN: 10 mg/dL (ref 6–20)
CO2: 24 mmol/L (ref 22–32)
Calcium: 8.8 mg/dL — ABNORMAL LOW (ref 8.9–10.3)
Chloride: 95 mmol/L — ABNORMAL LOW (ref 98–111)
Creatinine, Ser: 0.49 mg/dL (ref 0.44–1.00)
GFR calc Af Amer: >60 mL/min (ref >60)
GFR calc non Af Amer: >60 mL/min (ref >60)
Glucose, Bld: 167 mg/dL — ABNORMAL HIGH (ref 70–99)
Potassium: 3.6 mmol/L (ref 3.5–5.1)
Sodium: 134 mmol/L — ABNORMAL LOW (ref 135–145)
Total Bilirubin: 1.2 mg/dL (ref 0.3–1.2)
Total Protein: 8.7 g/dL — ABNORMAL HIGH (ref 6.5–8.1)

## 2018-06-11 LAB — ETHANOL: ALCOHOL ETHYL (B): 424 mg/dL — AB (ref <10)

## 2018-06-11 LAB — CBC WITH DIFFERENTIAL/PLATELET
Abs Immature Granulocytes: 0.03 10*3/uL (ref 0.00–0.07)
Basophils Absolute: 0.1 10*3/uL (ref 0.0–0.1)
Basophils Relative: 1 %
EOS PCT: 1 %
Eosinophils Absolute: 0.1 10*3/uL (ref 0.0–0.5)
HEMATOCRIT: 45.9 % (ref 36.0–46.0)
HEMOGLOBIN: 14.9 g/dL (ref 12.0–15.0)
Immature Granulocytes: 0 %
LYMPHS ABS: 2.5 10*3/uL (ref 0.7–4.0)
LYMPHS PCT: 30 %
MCH: 27.8 pg (ref 26.0–34.0)
MCHC: 32.5 g/dL (ref 30.0–36.0)
MCV: 85.6 fL (ref 80.0–100.0)
MONO ABS: 0.5 10*3/uL (ref 0.1–1.0)
Monocytes Relative: 6 %
NRBC: 0 % (ref 0.0–0.2)
Neutro Abs: 5.4 10*3/uL (ref 1.7–7.7)
Neutrophils Relative %: 62 %
Platelets: 205 10*3/uL (ref 150–400)
RBC: 5.36 MIL/uL — ABNORMAL HIGH (ref 3.87–5.11)
RDW: 13.7 % (ref 11.5–15.5)
WBC: 8.6 10*3/uL (ref 4.0–10.5)

## 2018-06-11 LAB — RAPID URINE DRUG SCREEN, HOSP PERFORMED
Amphetamines: POSITIVE — AB
Barbiturates: NOT DETECTED
Benzodiazepines: NOT DETECTED
COCAINE: NOT DETECTED
OPIATES: NOT DETECTED
TETRAHYDROCANNABINOL: NOT DETECTED

## 2018-06-11 LAB — URINALYSIS, ROUTINE W REFLEX MICROSCOPIC
Glucose, UA: NEGATIVE mg/dL
Ketones, ur: NEGATIVE mg/dL
Leukocytes, UA: NEGATIVE
NITRITE: NEGATIVE
PH: 7 (ref 5.0–8.0)
Protein, ur: >300 mg/dL — AB
SPECIFIC GRAVITY, URINE: 1.02 (ref 1.005–1.030)

## 2018-06-11 LAB — PROTIME-INR
INR: 1.05
Prothrombin Time: 13.6 seconds (ref 11.4–15.2)

## 2018-06-11 LAB — LIPASE, BLOOD: Lipase: 142 U/L — ABNORMAL HIGH (ref 11–51)

## 2018-06-11 LAB — PREGNANCY, URINE: PREG TEST UR: NEGATIVE

## 2018-06-11 MED ORDER — FOLIC ACID 1 MG PO TABS: 1.0000 mg | ORAL_TABLET | Freq: Every day | ORAL | 3 refills | Status: AC

## 2018-06-11 MED ORDER — SODIUM CHLORIDE 0.9 % IV BOLUS (SEPSIS)
1000.0000 mL | Freq: Once | INTRAVENOUS | Status: AC
  Administered 2018-06-11: 1000 mL via INTRAVENOUS

## 2018-06-11 MED ORDER — CARBAMAZEPINE 200 MG PO TABS
800.0000 mg | ORAL_TABLET | Freq: Once | ORAL | Status: AC
  Administered 2018-06-11: 800 mg via ORAL
  Filled 2018-06-11: qty 4

## 2018-06-11 MED ORDER — CARBAMAZEPINE 200 MG PO TABS: ORAL_TABLET | ORAL | 0 refills | Status: DC

## 2018-06-11 MED ORDER — VITAMIN B-1 100 MG PO TABS: 100.0000 mg | ORAL_TABLET | Freq: Every day | ORAL | 3 refills | Status: AC

## 2018-06-11 MED ORDER — THIAMINE HCL 100 MG/ML IJ SOLN
100.0000 mg | Freq: Once | INTRAMUSCULAR | Status: AC
  Administered 2018-06-11: 100 mg via INTRAVENOUS
  Filled 2018-06-11: qty 2

## 2018-06-11 MED ORDER — ONDANSETRON 4 MG PO TBDP: 4.0000 mg | ORAL_TABLET | Freq: Four times a day (QID) | ORAL | 0 refills | Status: AC | PRN

## 2018-06-11 NOTE — ED Notes (Signed)
Pt tolerated po's without emesis.

## 2018-06-11 NOTE — ED Notes (Signed)
Pt denies nausea, resting comfortably at this time.

## 2018-06-11 NOTE — ED Notes (Signed)
ED Provider at bedside. 

## 2018-06-11 NOTE — ED Notes (Signed)
PO fluids given

## 2018-06-11 NOTE — Discharge Instructions (Addendum)
I recommend that you start a multivitamin daily.

## 2018-06-14 ENCOUNTER — Ambulatory Visit: Payer: BC Managed Care – PPO | Admitting: Family Medicine

## 2018-07-12 ENCOUNTER — Ambulatory Visit: Payer: BC Managed Care – PPO | Admitting: Adult Health

## 2018-07-17 ENCOUNTER — Inpatient Hospital Stay (HOSPITAL_COMMUNITY)
Admission: EM | Admit: 2018-07-17 | Discharge: 2018-07-26 | DRG: 981 | Disposition: A | Discharge status: Home Health | Payer: BC Managed Care – PPO | Attending: Internal Medicine | Admitting: Internal Medicine

## 2018-07-17 ENCOUNTER — Encounter (HOSPITAL_COMMUNITY): Payer: Self-pay | Admitting: Emergency Medicine

## 2018-07-17 ENCOUNTER — Emergency Department (HOSPITAL_COMMUNITY): Payer: BC Managed Care – PPO

## 2018-07-17 DIAGNOSIS — S82851A Displaced trimalleolar fracture of right lower leg, initial encounter for closed fracture: Secondary | ICD-10-CM | POA: Diagnosis present

## 2018-07-17 DIAGNOSIS — Z79899 Other long term (current) drug therapy: Secondary | ICD-10-CM

## 2018-07-17 DIAGNOSIS — F10939 Alcohol use, unspecified with withdrawal, unspecified: Secondary | ICD-10-CM

## 2018-07-17 DIAGNOSIS — T8029XA Infection following other infusion, transfusion and therapeutic injection, initial encounter: Secondary | ICD-10-CM | POA: Diagnosis not present

## 2018-07-17 DIAGNOSIS — S82891A Other fracture of right lower leg, initial encounter for closed fracture: Secondary | ICD-10-CM

## 2018-07-17 DIAGNOSIS — K7030 Alcoholic cirrhosis of liver without ascites: Secondary | ICD-10-CM | POA: Diagnosis not present

## 2018-07-17 DIAGNOSIS — F17210 Nicotine dependence, cigarettes, uncomplicated: Secondary | ICD-10-CM | POA: Diagnosis present

## 2018-07-17 DIAGNOSIS — W010XXA Fall on same level from slipping, tripping and stumbling without subsequent striking against object, initial encounter: Secondary | ICD-10-CM | POA: Diagnosis present

## 2018-07-17 DIAGNOSIS — Z87311 Personal history of (healed) other pathological fracture: Secondary | ICD-10-CM | POA: Diagnosis not present

## 2018-07-17 DIAGNOSIS — R1310 Dysphagia, unspecified: Secondary | ICD-10-CM | POA: Diagnosis not present

## 2018-07-17 DIAGNOSIS — E119 Type 2 diabetes mellitus without complications: Secondary | ICD-10-CM | POA: Diagnosis present

## 2018-07-17 DIAGNOSIS — F329 Major depressive disorder, single episode, unspecified: Secondary | ICD-10-CM | POA: Diagnosis present

## 2018-07-17 DIAGNOSIS — S01512A Laceration without foreign body of oral cavity, initial encounter: Secondary | ICD-10-CM | POA: Diagnosis present

## 2018-07-17 DIAGNOSIS — Y92239 Unspecified place in hospital as the place of occurrence of the external cause: Secondary | ICD-10-CM | POA: Diagnosis present

## 2018-07-17 DIAGNOSIS — F988 Other specified behavioral and emotional disorders with onset usually occurring in childhood and adolescence: Secondary | ICD-10-CM | POA: Diagnosis present

## 2018-07-17 DIAGNOSIS — Z885 Allergy status to narcotic agent status: Secondary | ICD-10-CM | POA: Diagnosis not present

## 2018-07-17 DIAGNOSIS — X58XXXD Exposure to other specified factors, subsequent encounter: Secondary | ICD-10-CM | POA: Diagnosis not present

## 2018-07-17 DIAGNOSIS — M2550 Pain in unspecified joint: Secondary | ICD-10-CM | POA: Diagnosis not present

## 2018-07-17 DIAGNOSIS — R34 Anuria and oliguria: Secondary | ICD-10-CM | POA: Diagnosis present

## 2018-07-17 DIAGNOSIS — Z09 Encounter for follow-up examination after completed treatment for conditions other than malignant neoplasm: Secondary | ICD-10-CM

## 2018-07-17 DIAGNOSIS — E65 Localized adiposity: Secondary | ICD-10-CM | POA: Diagnosis present

## 2018-07-17 DIAGNOSIS — M25579 Pain in unspecified ankle and joints of unspecified foot: Secondary | ICD-10-CM

## 2018-07-17 DIAGNOSIS — S82891G Other fracture of right lower leg, subsequent encounter for closed fracture with delayed healing: Secondary | ICD-10-CM | POA: Diagnosis not present

## 2018-07-17 DIAGNOSIS — J029 Acute pharyngitis, unspecified: Secondary | ICD-10-CM | POA: Diagnosis present

## 2018-07-17 DIAGNOSIS — A419 Sepsis, unspecified organism: Secondary | ICD-10-CM | POA: Diagnosis not present

## 2018-07-17 DIAGNOSIS — I10 Essential (primary) hypertension: Secondary | ICD-10-CM | POA: Diagnosis present

## 2018-07-17 DIAGNOSIS — K7011 Alcoholic hepatitis with ascites: Secondary | ICD-10-CM | POA: Diagnosis present

## 2018-07-17 DIAGNOSIS — Z794 Long term (current) use of insulin: Secondary | ICD-10-CM

## 2018-07-17 DIAGNOSIS — R339 Retention of urine, unspecified: Secondary | ICD-10-CM | POA: Diagnosis present

## 2018-07-17 DIAGNOSIS — W19XXXA Unspecified fall, initial encounter: Secondary | ICD-10-CM

## 2018-07-17 DIAGNOSIS — I808 Phlebitis and thrombophlebitis of other sites: Secondary | ICD-10-CM | POA: Diagnosis not present

## 2018-07-17 DIAGNOSIS — F10239 Alcohol dependence with withdrawal, unspecified: Secondary | ICD-10-CM

## 2018-07-17 DIAGNOSIS — J45909 Unspecified asthma, uncomplicated: Secondary | ICD-10-CM | POA: Diagnosis present

## 2018-07-17 DIAGNOSIS — R509 Fever, unspecified: Secondary | ICD-10-CM | POA: Diagnosis not present

## 2018-07-17 DIAGNOSIS — L309 Dermatitis, unspecified: Secondary | ICD-10-CM | POA: Diagnosis not present

## 2018-07-17 DIAGNOSIS — E669 Obesity, unspecified: Secondary | ICD-10-CM | POA: Diagnosis present

## 2018-07-17 DIAGNOSIS — R4781 Slurred speech: Secondary | ICD-10-CM | POA: Diagnosis present

## 2018-07-17 DIAGNOSIS — L409 Psoriasis, unspecified: Secondary | ICD-10-CM | POA: Diagnosis present

## 2018-07-17 DIAGNOSIS — F1020 Alcohol dependence, uncomplicated: Secondary | ICD-10-CM | POA: Diagnosis not present

## 2018-07-17 DIAGNOSIS — R569 Unspecified convulsions: Secondary | ICD-10-CM | POA: Diagnosis not present

## 2018-07-17 DIAGNOSIS — F10231 Alcohol dependence with withdrawal delirium: Secondary | ICD-10-CM

## 2018-07-17 DIAGNOSIS — K7031 Alcoholic cirrhosis of liver with ascites: Secondary | ICD-10-CM | POA: Diagnosis present

## 2018-07-17 DIAGNOSIS — R011 Cardiac murmur, unspecified: Secondary | ICD-10-CM | POA: Diagnosis present

## 2018-07-17 DIAGNOSIS — F1010 Alcohol abuse, uncomplicated: Secondary | ICD-10-CM | POA: Diagnosis present

## 2018-07-17 DIAGNOSIS — T148XXA Other injury of unspecified body region, initial encounter: Secondary | ICD-10-CM | POA: Diagnosis not present

## 2018-07-17 HISTORY — DX: Type 2 diabetes mellitus without complications: E11.9

## 2018-07-17 LAB — CBC WITH DIFFERENTIAL/PLATELET
Abs Immature Granulocytes: 0.03 10*3/uL (ref 0.00–0.07)
Basophils Absolute: 0.1 10*3/uL (ref 0.0–0.1)
Basophils Relative: 1 %
Eosinophils Absolute: 0.1 10*3/uL (ref 0.0–0.5)
Eosinophils Relative: 1 %
HCT: 42.9 % (ref 36.0–46.0)
Hemoglobin: 14.4 g/dL (ref 12.0–15.0)
IMMATURE GRANULOCYTES: 0 %
Lymphocytes Relative: 7 %
Lymphs Abs: 0.7 10*3/uL (ref 0.7–4.0)
MCH: 28.7 pg (ref 26.0–34.0)
MCHC: 33.6 g/dL (ref 30.0–36.0)
MCV: 85.5 fL (ref 80.0–100.0)
Monocytes Absolute: 0.5 10*3/uL (ref 0.1–1.0)
Monocytes Relative: 5 %
Neutro Abs: 8.2 10*3/uL — ABNORMAL HIGH (ref 1.7–7.7)
Neutrophils Relative %: 86 %
Platelets: 183 10*3/uL (ref 150–400)
RBC: 5.02 MIL/uL (ref 3.87–5.11)
RDW: 13.3 % (ref 11.5–15.5)
WBC: 9.5 10*3/uL (ref 4.0–10.5)
nRBC: 0 % (ref 0.0–0.2)

## 2018-07-17 LAB — I-STAT BETA HCG BLOOD, ED (MC, WL, AP ONLY): I-stat hCG, quantitative: <5 m[IU]/mL (ref <5)

## 2018-07-17 LAB — COMPREHENSIVE METABOLIC PANEL
ALT: 34 U/L (ref 0–44)
AST: 202 U/L — ABNORMAL HIGH (ref 15–41)
Albumin: 3.7 g/dL (ref 3.5–5.0)
Alkaline Phosphatase: 114 U/L (ref 38–126)
Anion gap: 17 — ABNORMAL HIGH (ref 5–15)
BILIRUBIN TOTAL: 2.9 mg/dL — AB (ref 0.3–1.2)
BUN: 6 mg/dL (ref 6–20)
CO2: 23 mmol/L (ref 22–32)
Calcium: 8.6 mg/dL — ABNORMAL LOW (ref 8.9–10.3)
Chloride: 91 mmol/L — ABNORMAL LOW (ref 98–111)
Creatinine, Ser: 0.6 mg/dL (ref 0.44–1.00)
GFR calc Af Amer: >60 mL/min (ref >60)
GFR calc non Af Amer: >60 mL/min (ref >60)
Glucose, Bld: 198 mg/dL — ABNORMAL HIGH (ref 70–99)
Potassium: 4.2 mmol/L (ref 3.5–5.1)
Sodium: 131 mmol/L — ABNORMAL LOW (ref 135–145)
Total Protein: 8.8 g/dL — ABNORMAL HIGH (ref 6.5–8.1)

## 2018-07-17 LAB — URINALYSIS, ROUTINE W REFLEX MICROSCOPIC
Bilirubin Urine: NEGATIVE
Glucose, UA: NEGATIVE mg/dL
Ketones, ur: 5 mg/dL — AB
Leukocytes, UA: NEGATIVE
NITRITE: NEGATIVE
Protein, ur: 100 mg/dL — AB
SPECIFIC GRAVITY, URINE: 1.014 (ref 1.005–1.030)
pH: 7 (ref 5.0–8.0)

## 2018-07-17 LAB — PHOSPHORUS: Phosphorus: 4.4 mg/dL (ref 2.5–4.6)

## 2018-07-17 LAB — RAPID URINE DRUG SCREEN, HOSP PERFORMED
Amphetamines: NOT DETECTED
BARBITURATES: NOT DETECTED
Benzodiazepines: POSITIVE — AB
Cocaine: NOT DETECTED
Opiates: POSITIVE — AB
TETRAHYDROCANNABINOL: NOT DETECTED

## 2018-07-17 LAB — GLUCOSE, CAPILLARY
Glucose-Capillary: 105 mg/dL — ABNORMAL HIGH (ref 70–99)
Glucose-Capillary: 110 mg/dL — ABNORMAL HIGH (ref 70–99)
Glucose-Capillary: 133 mg/dL — ABNORMAL HIGH (ref 70–99)

## 2018-07-17 LAB — ETHANOL

## 2018-07-17 LAB — MRSA PCR SCREENING: MRSA by PCR: NEGATIVE

## 2018-07-17 LAB — MAGNESIUM: Magnesium: 1.4 mg/dL — ABNORMAL LOW (ref 1.7–2.4)

## 2018-07-17 LAB — CBG MONITORING, ED
Glucose-Capillary: 197 mg/dL — ABNORMAL HIGH (ref 70–99)
Glucose-Capillary: 123 mg/dL — ABNORMAL HIGH (ref 70–99)

## 2018-07-17 MED ORDER — SODIUM CHLORIDE 0.9 % IV BOLUS
1000.0000 mL | Freq: Once | INTRAVENOUS | Status: AC
  Administered 2018-07-17: 1000 mL via INTRAVENOUS

## 2018-07-17 MED ORDER — LORAZEPAM 1 MG PO TABS: 0.0000 mg | ORAL_TABLET | Freq: Two times a day (BID) | ORAL | Status: DC

## 2018-07-17 MED ORDER — DEXMEDETOMIDINE HCL IN NACL 200 MCG/50ML IV SOLN
0.2000 ug/kg/h | INTRAVENOUS | Status: DC
  Administered 2018-07-17: 0.2 ug/kg/h via INTRAVENOUS
  Filled 2018-07-17: qty 50

## 2018-07-17 MED ORDER — LORAZEPAM 1 MG PO TABS: 0.0000 mg | ORAL_TABLET | Freq: Four times a day (QID) | ORAL | Status: DC

## 2018-07-17 MED ORDER — INSULIN ASPART 100 UNIT/ML ~~LOC~~ SOLN
0.0000 [IU] | SUBCUTANEOUS | Status: DC
  Administered 2018-07-17 – 2018-07-18 (×2): 2 [IU] via SUBCUTANEOUS

## 2018-07-17 MED ORDER — LEVETIRACETAM IN NACL 500 MG/100ML IV SOLN
500.0000 mg | Freq: Two times a day (BID) | INTRAVENOUS | Status: DC
  Administered 2018-07-17 – 2018-07-19 (×4): 500 mg via INTRAVENOUS
  Filled 2018-07-17 (×4): qty 100

## 2018-07-17 MED ORDER — PHENOL 1.4 % MT LIQD
1.0000 | OROMUCOSAL | Status: DC | PRN
  Administered 2018-07-17: 1 via OROMUCOSAL
  Filled 2018-07-17: qty 177

## 2018-07-17 MED ORDER — LORAZEPAM 2 MG/ML IJ SOLN
1.0000 mg | Freq: Three times a day (TID) | INTRAMUSCULAR | Status: DC
  Administered 2018-07-17 – 2018-07-18 (×5): 1 mg via INTRAVENOUS
  Filled 2018-07-17 (×5): qty 1

## 2018-07-17 MED ORDER — ONDANSETRON HCL 4 MG/2ML IJ SOLN
4.0000 mg | Freq: Once | INTRAMUSCULAR | Status: AC
  Administered 2018-07-17: 4 mg via INTRAVENOUS
  Filled 2018-07-17: qty 2

## 2018-07-17 MED ORDER — FAMOTIDINE IN NACL 20-0.9 MG/50ML-% IV SOLN
20.0000 mg | Freq: Two times a day (BID) | INTRAVENOUS | Status: DC
  Administered 2018-07-17 (×2): 20 mg via INTRAVENOUS
  Filled 2018-07-17 (×3): qty 50

## 2018-07-17 MED ORDER — THIAMINE HCL 100 MG/ML IJ SOLN
100.0000 mg | Freq: Every day | INTRAMUSCULAR | Status: DC
  Administered 2018-07-17 – 2018-07-18 (×2): 100 mg via INTRAVENOUS
  Filled 2018-07-17 (×2): qty 2

## 2018-07-17 MED ORDER — ONDANSETRON HCL 4 MG/2ML IJ SOLN: 4.0000 mg | Freq: Four times a day (QID) | INTRAMUSCULAR | Status: DC | PRN

## 2018-07-17 MED ORDER — MORPHINE SULFATE (PF) 4 MG/ML IV SOLN
4.0000 mg | Freq: Once | INTRAVENOUS | Status: AC
  Administered 2018-07-17: 4 mg via INTRAVENOUS
  Filled 2018-07-17: qty 1

## 2018-07-17 MED ORDER — VITAMIN B-1 100 MG PO TABS
100.0000 mg | ORAL_TABLET | Freq: Every day | ORAL | Status: DC
  Filled 2018-07-17: qty 1

## 2018-07-17 MED ORDER — ORAL CARE MOUTH RINSE
15.0000 mL | Freq: Two times a day (BID) | OROMUCOSAL | Status: DC
  Administered 2018-07-17 – 2018-07-26 (×7): 15 mL via OROMUCOSAL

## 2018-07-17 MED ORDER — LORAZEPAM 2 MG/ML IJ SOLN
1.0000 mg | Freq: Once | INTRAMUSCULAR | Status: AC
  Administered 2018-07-17: 1 mg via INTRAVENOUS
  Filled 2018-07-17: qty 1

## 2018-07-17 MED ORDER — ENOXAPARIN SODIUM 40 MG/0.4ML ~~LOC~~ SOLN
40.0000 mg | SUBCUTANEOUS | Status: DC
  Administered 2018-07-17 – 2018-07-25 (×8): 40 mg via SUBCUTANEOUS
  Filled 2018-07-17 (×10): qty 0.4

## 2018-07-17 MED ORDER — DEXMEDETOMIDINE HCL IN NACL 400 MCG/100ML IV SOLN
0.4000 ug/kg/h | INTRAVENOUS | Status: DC
  Administered 2018-07-17: 0.3 ug/kg/h via INTRAVENOUS
  Filled 2018-07-17: qty 100

## 2018-07-17 MED ORDER — LORAZEPAM 2 MG/ML IJ SOLN
0.0000 mg | Freq: Four times a day (QID) | INTRAMUSCULAR | Status: DC
  Administered 2018-07-17 (×2): 2 mg via INTRAVENOUS
  Administered 2018-07-17: 1 mg via INTRAVENOUS
  Administered 2018-07-17: 2 mg via INTRAVENOUS
  Filled 2018-07-17 (×2): qty 2

## 2018-07-17 MED ORDER — LORAZEPAM 2 MG/ML IJ SOLN: 0.0000 mg | Freq: Two times a day (BID) | INTRAMUSCULAR | Status: DC

## 2018-07-17 MED ORDER — IBUPROFEN 200 MG PO TABS
400.0000 mg | ORAL_TABLET | Freq: Once | ORAL | Status: AC
  Administered 2018-07-17: 400 mg via ORAL
  Filled 2018-07-17: qty 2

## 2018-07-17 NOTE — ED Provider Notes (Signed)
[facility] EMERGENCY DEPARTMENT Provider Note   CSN: [id number] Arrival date & time: 07/17/18  1093     History   Chief Complaint Chief Complaint  Patient presents with  . Seizures    HPI Angela Villegas is a 28 y.o. female.  HPI   Angela Villegas is a 28 y.o. female, with a history of alcohol abuse, asthma, DM, and HTN, presenting to the ED with seizure. Patient is accompanied by her husband who provides some of the history elements. States patient was lying in bed and began having a full body seizure where her body stiffened.  This lasted for approximately a minute and a half.  Other than the seizure, her only current complaint is muscle soreness in the back. Patient is a regular alcohol user.  Typically drinks at least 1/5 of vodka a day.  She has been trying to self taper.  Last alcohol intake was approximately 10 PM last night. She has had recent tremors, diaphoresis, and vomiting that she associates with withdrawal symptoms, which she has had before.  Denies fever, chest pain, abdominal pain, shortness of breath, numbness, weakness, incontinence, or any other complaints.      Past Medical History:  Diagnosis Date  . ADD (attention deficit disorder)   . Alcohol abuse   . Asthma   . Diabetes mellitus (HCC)   . Diabetes mellitus without complication (HCC)   . Hypertension   . Liver disease, unspecified     Patient Active Problem List   Diagnosis Date Noted  . Pyomyositis of neck 07/27/2017  . Injury (inflammation/mass effect) of right internal jugular vein 07/27/2017  . Diabetes mellitus, new onset (HCC) 07/27/2017  . 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 Thrombocytopenia with coagulopathy 07/27/2017  . Hypertension 07/27/2017  . Asthma 07/27/2017  . ADD (attention deficit disorder) 07/27/2017  . 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 Acute hypokalemia 07/27/2017  . Cellulitis and abscess of neck 07/26/2017  . Alcoholic hepatitis with  ascites 12/09/2015  . Hepatic failure (HCC) 12/06/2015  . Ascites 12/06/2015  . Alcoholic hepatitis 12/06/2015  . Coagulopathy (HCC) 12/06/2015  . Alcohol abuse 12/06/2015  . Acute alcohol intoxication (HCC)     Past Surgical History:  Procedure Laterality Date  . EXCISION MASS NECK Right 07/28/2017   Procedure: exploration of right neck control of hemorrhage;  Surgeon: Osborn Coho, MD;  [facility] OR;  Service: ENT;  Laterality: Right;  . INCISION AND DRAINAGE ABSCESS Right 07/27/2017   Procedure: INCISION AND DRAINAGE ABSCESS;  Surgeon: Osborn Coho, MD;  Location: [facility] OR;  Service: ENT;  Laterality: Right;     OB History   No obstetric history on file.      Home Medications    Prior to Admission medications   Medication Sig Start Date End Date Taking? Authorizing Provider  amphetamine-dextroamphetamine (ADDERALL) 10 MG tablet Take 20 mg by mouth 2 (two) times daily.  12/21/14  Yes [provider]  carbamazepine (TEGRETOL) 200 MG tablet 800mg  PO QD X 1D, then 600mg  PO QD X 1D, then 400mg  QD X 1D, then 200mg  PO QD X 2D Patient taking differently: Take 200-800 mg by mouth daily at 12 noon. 800mg  PO QD X 1D, then 600mg  PO QD X 1D, then 400mg  QD X 1D, then 200mg  PO QD X 2D 06/11/18  Yes Ward, Kristen N, DO  fluticasone (FLONASE) 50 MCG/ACT nasal spray Place 2 sprays into both nostrils daily as needed for allergies.    Yes [provider]  folic acid (FOLVITE) 1 MG tablet Take 1 tablet (1 mg total) by mouth daily. 06/11/18  Yes Ward, Kristen N, DO  Hypromellose (ARTIFICIAL TEARS OP) Place 1 drop into both eyes as needed (dry eye).    Yes [provider]  insulin glargine (LANTUS) 100 UNIT/ML injection Inject 0.18 mLs (18 Units total) into the skin daily. Patient taking differently: Inject 24 Units into the skin at bedtime.  08/02/17  Yes Regalado, Belkys A, MD  magnesium oxide (MAG-OX) 400 (241.3 Mg) MG tablet Take 1 tablet (400 mg total) by mouth 2  (two) times daily. 08/02/17  Yes Regalado, Belkys A, MD  mupirocin ointment (BACTROBAN) 2 % Place 1 application into the nose 2 (two) times daily. For skin irritation   Yes [provider]  ondansetron (ZOFRAN ODT) 4 MG disintegrating tablet Take 1 tablet (4 mg total) by mouth every 6 (six) hours as needed for nausea or vomiting. 06/11/18  Yes Ward, Layla MawKristen N, DO  PROAIR HFA 108 (90 Base) MCG/ACT inhaler Inhale 2 puffs into the lungs every 4 (four) hours as needed for wheezing. 07/05/17  Yes [provider]  propranolol (INDERAL) 10 MG tablet Take 10 mg by mouth 2 (two) times daily.   Yes [provider]  sertraline (ZOLOFT) 100 MG tablet Take 100 mg by mouth daily.   Yes [provider]  thiamine (VITAMIN B-1) 100 MG tablet Take 1 tablet (100 mg total) by mouth daily. 06/11/18  Yes Ward, Layla MawKristen N, DO  ferrous sulfate 325 (65 FE) MG tablet Take 1 tablet (325 mg total) by mouth 2 (two) times daily with a meal. Patient not taking: Reported on 07/17/2018 08/01/17   Alba Coryegalado, Belkys A, MD    Family History Family History  Problem Relation Age of Onset  . GER disease Mother     Social History Social History   Tobacco Use  . Smoking status: Current Every Day Smoker    Packs/day: 1.00    Types: Cigarettes  . Smokeless tobacco: Never Used  Substance Use Topics  . Alcohol use: Yes    Comment: 1 fifth of liquor daily or more   . Drug use: No     Allergies   Vicodin [hydrocodone-acetaminophen]   Review of Systems Review of Systems  Constitutional: Positive for diaphoresis. Negative for chills and fever.  Respiratory: Negative for cough and shortness of breath.   Cardiovascular: Negative for chest pain.  Gastrointestinal: Positive for nausea and vomiting. Negative for abdominal pain, blood in stool and diarrhea.  Genitourinary: Negative for dysuria and hematuria.  Musculoskeletal: Positive for back pain.  Neurological: Positive for seizures. Negative  for syncope, weakness and numbness.  All other systems reviewed and are negative.    Physical Exam Updated Vital Signs BP (!) 150/106   Pulse 95   Resp (!) 21   Ht 5\' 1"  (1.549 m)   Wt 70.8 kg   SpO2 99%   BMI 29.48 kg/m   Physical Exam Vitals signs and nursing note reviewed.  Constitutional:      General: She is in acute distress.     Appearance: She is well-developed. She is ill-appearing and diaphoretic.  HENT:     Head: Normocephalic.     Comments: Small laceration/abrasion noted to the left side of the tongue.  No active hemorrhage.    Nose: Nose normal.     Mouth/Throat:     Mouth: Mucous membranes are moist.     Pharynx: Oropharynx is clear.  Eyes:  Extraocular Movements: Extraocular movements intact.     Conjunctiva/sclera: Conjunctivae normal.     Pupils: Pupils are equal, round, and reactive to light.  Neck:     Musculoskeletal: Neck supple.  Cardiovascular:     Rate and Rhythm: Normal rate and regular rhythm.     Pulses: Normal pulses.          Radial pulses are 2+ on the right side and 2+ on the left side.       Posterior tibial pulses are 2+ on the right side and 2+ on the left side.     Heart sounds: Normal heart sounds.  Pulmonary:     Effort: Pulmonary effort is normal. No respiratory distress.     Breath sounds: Normal breath sounds.  Abdominal:     Palpations: Abdomen is soft.     Tenderness: There is no abdominal tenderness. There is no guarding.  Musculoskeletal:     Comments: Normal motor function intact in all extremities. No midline spinal tenderness.   Overall trauma exam performed without acute abnormalities noted other than those mentioned.  Lymphadenopathy:     Cervical: No cervical adenopathy.  Skin:    General: Skin is warm.  Neurological:     Mental Status: She is alert and oriented to person, place, and time.     Comments: Sensation to light touch grossly intact in each of the extremities. Motor function intact in each of the  extremities and equal bilaterally.  Psychiatric:        Behavior: Behavior normal.      ED Treatments / Results  Labs (all labs ordered are listed, but only abnormal results are displayed) Labs Reviewed  COMPREHENSIVE METABOLIC PANEL - Abnormal; Notable for the following components:      Result Value   Sodium 131 (*)    Chloride 91 (*)    Glucose, Bld 198 (*)    Calcium 8.6 (*)    Total Protein 8.8 (*)    AST 202 (*)    Total Bilirubin 2.9 (*)    Anion gap 17 (*)    All other components within normal limits  CBC WITH DIFFERENTIAL/PLATELET - Abnormal; Notable for the following components:   Neutro Abs 8.2 (*)    All other components within normal limits  CBG MONITORING, ED - Abnormal; Notable for the following components:   Glucose-Capillary 197 (*)    All other components within normal limits  ETHANOL  URINALYSIS, ROUTINE W REFLEX MICROSCOPIC  RAPID URINE DRUG SCREEN, HOSP PERFORMED  I-STAT BETA HCG BLOOD, ED ([facility], [facility], [facility] ONLY)    EKG EKG Interpretation  Date/Time:  Sunday July 17 2018 08:48:39 EST Ventricular Rate:  107 PR Interval:    QRS Duration: 86 QT Interval:  342 QTC Calculation: 457 R Axis:   -5 Text Interpretation:  Sinus tachycardia Borderline low voltage, extremity leads Baseline wander in lead(s) I II aVR V2 Artifact No significant change since last tracing Confirmed by Gwyneth Sprout ([id number]) on 07/17/2018 9:05:50 AM   Radiology Dg Ankle Complete Right  Result Date: 07/17/2018 CLINICAL DATA:  Right ankle fracture. EXAM: RIGHT ANKLE - COMPLETE 3+ VIEW COMPARISON:  None. FINDINGS: Overlying splint material is identified. Obliquely oriented fracture through the distal fibula. Medial malleolar fracture is also noted which appears mildly displaced. A small sliver of bone fragment is adjacent to the posterior malleolus. IMPRESSION: 1. Bilayer fracture is not identified. 2. Small bone fragment off the posterior malleolus also noted. A CT of the right ankle  may be helpful to evaluate for posterior malleolar injury. Electronically Signed   By: Signa Kell M.D.   On: 07/17/2018 09:16   Dg Chest Portable 1 View  Result Date: 07/17/2018 CLINICAL DATA:  Seizure. EXAM: PORTABLE CHEST 1 VIEW COMPARISON:  06/10/2018 FINDINGS: The heart size and mediastinal contours are within normal limits. Both lungs are clear. The visualized skeletal structures are unremarkable. IMPRESSION: No active disease. Electronically Signed   By: Signa Kell M.D.   On: 07/17/2018 07:37    Procedures .Critical Care Performed by: Anselm Pancoast, PA-C Authorized by: Anselm Pancoast, PA-C   Critical care provider statement:    Critical care time (minutes):  35   Critical care was necessary to treat or prevent imminent or life-threatening deterioration of the following conditions:  CNS failure or compromise and metabolic crisis (Acute alcohol withdrawal with seizures)   Critical care was time spent personally by me on the following activities:  Development of treatment plan with patient or surrogate, discussions with consultants, evaluation of patient's response to treatment, examination of patient, obtaining history from patient or surrogate, re-evaluation of patient's condition, pulse oximetry, ordering and review of radiographic studies, ordering and review of laboratory studies and ordering and performing treatments and interventions   I assumed direction of critical care for this patient from another provider in my specialty: no     (including critical care time)  Medications Ordered in ED Medications  LORazepam (ATIVAN) injection 0-4 mg (2 mg Intravenous Given 07/17/18 1032)    Or  LORazepam (ATIVAN) tablet 0-4 mg ( Oral See Alternative 07/17/18 1032)  LORazepam (ATIVAN) injection 0-4 mg (has no administration in time range)    Or  LORazepam (ATIVAN) tablet 0-4 mg (has no administration in time range)  thiamine (VITAMIN B-1) tablet 100 mg ( Oral See Alternative 07/17/18 1011)     Or  thiamine (B-1) injection 100 mg (100 mg Intravenous Given 07/17/18 1011)  sodium chloride 0.9 % bolus 1,000 mL (0 mLs Intravenous Stopped 07/17/18 0919)  ondansetron (ZOFRAN) injection 4 mg (4 mg Intravenous Given 07/17/18 0918)  LORazepam (ATIVAN) injection 1 mg (1 mg Intravenous Given 07/17/18 0917)  morphine 4 MG/ML injection 4 mg (4 mg Intravenous Given 07/17/18 0937)  sodium chloride 0.9 % bolus 1,000 mL (0 mLs Intravenous Stopped 07/17/18 1023)     Initial Impression / Assessment and Plan / ED Course  I have reviewed the triage vital signs and the nursing notes.  Pertinent labs & imaging results that were available during my care of the patient were reviewed by me and considered in my medical decision making (see chart for details).  Clinical Course as of Jul 18 1051  Sun Jul 17, 2018  8832 Spoke with Dr. Kendrick Fries, CCM. They will come evaluate the patient.   [SJ]  [id number] Spoke with Dr. Everardo Pacific, orthopedics.  He will consult on the patient during her admission.   [SJ]  1001 Patient states she is feeling a little better. She is less diaphoretic.   [SJ]    Clinical Course User Index [SJ] Anselm Pancoast, PA-C    Patient presents following a seizure.  Suspect alcohol withdrawal seizure.  Ill-appearing.  She began having seizures less than 12 hours after her last drink of alcohol. Concern for dehydration based on some of the patient's lab results. During her ED course, patient stood up to try to get out of bed, and had a another seizure.  It was noted after the seizure that she  had deformity to the right ankle.  This was reduced by Dr. Anitra LauthPlunkett at the bedside.  A splint was placed and she was confirmed to be neurovascularly intact in her foot and toes.  Admitted via critical care service.  Orthopedics to consult during patient's inpatient course. UA and UDS pending at time of admission.  Findings and plan of care discussed with Gwyneth SproutWhitney Plunkett, MD. Dr. Anitra LauthPlunkett personally evaluated and examined  this patient.   Vitals:   07/17/18 0935 07/17/18 0945 07/17/18 1000 07/17/18 1017  BP: (!) 160/139 (!) 164/133 (!) 132/99 (!) 147/97  Pulse: 99 99 97 (!) 103  Resp:  18 18   SpO2:  97% 98%   Weight:      Height:         Final Clinical Impressions(s) / ED Diagnoses   Final diagnoses:  Alcohol withdrawal seizure with complication [facility](HCC)    ED Discharge Orders    None       Concepcion LivingJoy, Shawn C, PA-C 07/17/18 1059    Gwyneth SproutPlunkett, Whitney, MD 07/18/18 2236

## 2018-07-17 NOTE — H&P (Addendum)
Attending:   CC seizure Consult from Dr. Theora Gianotti Subjective: Seen and examined in with Dr. Glenna Fellows and I helped formulate the plan with her.  28 y/o female with a history of heavy alcohol abuse was brought to the ER this morning by EMS in the setting of a seizure.  Her husband provides the history.  He says that she has either tried to quit drinking or has been admitted for various medical problems and forced to quit drinking over the years and has gone through alcohol withdrawal multiple times in the past.    In the ER she got up and tried to stand and broke her ankle.  It was reduced by Dr. Maryan Rued   ROS: cannot obtain due to confusion  Past Medical History:  Diagnosis Date  . ADD (attention deficit disorder)   . Alcohol abuse   . Asthma   . Diabetes mellitus ([place])   . Diabetes mellitus without complication ([place])   . Hypertension   . Liver disease, unspecified      Family History  Problem Relation Age of Onset  . GER disease Mother      Social History   Socioeconomic History  . Marital status: Married    Spouse name: Not on file  . Number of children: Not on file  . Years of education: Not on file  . Highest education level: Not on file  Occupational History  . Not on file  Social Needs  . Financial resource strain: Not on file  . Food insecurity:    Worry: Not on file    Inability: Not on file  . Transportation needs:    Medical: Not on file    Non-medical: Not on file  Tobacco Use  . Smoking status: Current Every Day Smoker    Packs/day: 1.00    Types: Cigarettes  . Smokeless tobacco: Never Used  Substance and Sexual Activity  . Alcohol use: Yes    Comment: 1 fifth of liquor daily or more   . Drug use: No  . Sexual activity: Not on file  Lifestyle  . Physical activity:    Days per week: Not on file    Minutes per session: Not on file  . Stress: Not on file  Relationships  . Social connections:    Talks on phone: Not on file    Gets together: Not  on file    Attends religious service: Not on file    Active member of club or organization: Not on file    Attends meetings of clubs or organizations: Not on file    Relationship status: Not on file  . Intimate partner violence:    Fear of current or ex partner: Not on file    Emotionally abused: Not on file    Physically abused: Not on file    Forced sexual activity: Not on file  Other Topics Concern  . Not on file  Social History Narrative  . Not on file     Allergies  Allergen Reactions  . Vicodin [Hydrocodone-Acetaminophen] Itching and Nausea Only     '@encmedstart' @   Objective: Vitals:   07/17/18 1345 07/17/18 1400 07/17/18 1415 07/17/18 1430  BP: (!) 137/93 (!) 137/91 131/88 132/79  Pulse: (!) 104 (!) 110 (!) 106 (!) 101  Resp: '17 17 18 18  ' Temp:      TempSrc:      SpO2: 98% 98% 98% 97%  Weight:      Height:  Intake/Output Summary (Last 24 hours) at 07/17/2018 1620 Last data filed at 07/17/2018 0919 Gross per 24 hour  Intake 1000 ml  Output -  Net 1000 ml    General:  Resting comfortably in bed HENT: NCAT tongue laceration noted but able to visualize soft palate PULM: CTA B, normal effort CV: RRR, no mgr GI: BS+, soft, nontender MSK: R ankle in splint Derm: psoriasis changes noted scalp, ears with some erythema/edema overlying Neuro: drowsy, will wake up and nod head appropriately   CBC    Component Value Date/Time   WBC 9.5 07/17/2018 0651   RBC 5.02 07/17/2018 0651   HGB 14.4 07/17/2018 0651   HCT 42.9 07/17/2018 0651   PLT 183 07/17/2018 0651   MCV 85.5 07/17/2018 0651   MCH 28.7 07/17/2018 0651   MCHC 33.6 07/17/2018 0651   RDW 13.3 07/17/2018 0651   LYMPHSABS 0.7 07/17/2018 0651   MONOABS 0.5 07/17/2018 0651   EOSABS 0.1 07/17/2018 0651   BASOSABS 0.1 07/17/2018 0651    BMET    Component Value Date/Time   NA 131 (L) 07/17/2018 0651   K 4.2 07/17/2018 0651   CL 91 (L) 07/17/2018 0651   CO2 23 07/17/2018 0651   GLUCOSE 198  (H) 07/17/2018 0651   BUN 6 07/17/2018 0651   CREATININE 0.60 07/17/2018 0651   CALCIUM 8.6 (L) 07/17/2018 0651   GFRNONAA >60 07/17/2018 0651   GFRAA >60 07/17/2018 0651    CXR images none  Impression/Plan:  Ankle fracture: maintain splint, f/u ortho recommendations Alcohol withdrawal seizure: add keppra overnight, some low dose schedule benzodiazepine Alcohol withdrawal delirium: precedex, thiamine, folate Alcoholic hepatitis: repeat LFT in AM, supportive care Alcoholic cirrhosis: consider repeat abdominal imaging this admission, has history of acites in the past  Admit to ICU for close monitoring  I met with her husband bedside  My cc time 45 minutes  Roselie Awkward, MD [place] [facility] Pager: [phone] Cell: [phone] After 3pm or if no response, call [phone]

## 2018-07-17 NOTE — ED Notes (Addendum)
Pt seized for , witnessed by husband.

## 2018-07-17 NOTE — Progress Notes (Signed)
Orthopedic Tech Progress Note Patient Details:  Angela Villegas 1990-08-02 [id number]  Ortho Devices Type of Ortho Device: Post (short leg) splint, Stirrup splint Ortho Device/Splint Location: rle. applied post reduction at drs request. Ortho Device/Splint Interventions: Ordered, Application, Adjustment   Post Interventions Patient Tolerated: Well Instructions Provided: Care of device, Adjustment of device   Trinna Post 07/17/2018, 8:07 AM

## 2018-07-17 NOTE — Progress Notes (Signed)
eLink Physician-Brief Progress Note Patient Name: Angela Villegas DOB: 1990-08-04 MRN: [id number]   Date of Service  07/17/2018  HPI/Events of Note  Multiple issues: 1. Oliguria - Bladder scan with > 700 mL, 2. Patient requests diet - able to swallow water OK, 2. Patient c/w sore throat and 4. Patient c/o pain related to Fx of fibula - AST elevated and allergy to Vicodan. Creatinine = 0.60.    eICU Interventions  Will order: 1. I/O cath PRN. 2. Clear liquid diet.  3. Chloraseptic Spray 1 spray PRN. 4. Motrin 400 mg PO X 1.     Intervention Category Major Interventions: Other:  Sommer,Steven Dennard Nipugene 07/17/2018, 9:07 PM

## 2018-07-17 NOTE — ED Notes (Signed)
Pt coming from EMS after a witnessed seizure by her boyfriend. Boyfriend stated pt began seizing in her sleep and a full body seizure lasted for approx. A minute and a half. Upon EMS arrival pt was awake but not very responsive. Hypertensive on arrival. Some oral trauma noted. Was nauseous during ride so EMS gave 4 mg IV Zofran. Pt is currently going through alcohol withdraw. Last drink was around 8 or 9pm last night.

## 2018-07-17 NOTE — Consult Note (Signed)
ORTHOPAEDIC CONSULTATION  REQUESTING PHYSICIAN: Juanito Doom, MD  Chief Complaint: Right ankle fracture  HPI: Angela Villegas is a 28 y.o. female with history of alcohol abuse and withdrawal seizure resulting in a right bimalleolar ankle fracture.  Orthopedics was consulted by the emergency room management of this.  Patient denies any previous issues with ankle.  She does not provide much in the way of history secondary to pain.  She is no family in the room currently.  Multiple medical problems as listed below as well as using 1/5 of alcohol per day on average.  She was trying to minimize her use recently.  Past Medical History:  Diagnosis Date  . ADD (attention deficit disorder)   . Alcohol abuse   . Asthma   . Diabetes mellitus ([place])   . Diabetes mellitus without complication ([place])   . Hypertension   . Liver disease, unspecified    Past Surgical History:  Procedure Laterality Date  . EXCISION MASS NECK Right 07/28/2017   Procedure: exploration of right neck control of hemorrhage;  Surgeon: Jerrell Belfast, MD;  Location: [facility];  Service: ENT;  Laterality: Right;  . INCISION AND DRAINAGE ABSCESS Right 07/27/2017   Procedure: INCISION AND DRAINAGE ABSCESS;  Surgeon: Jerrell Belfast, MD;  Location: [facility];  Service: ENT;  Laterality: Right;   Social History   Socioeconomic History  . Marital status: Married    Spouse name: Not on file  . Number of children: Not on file  . Years of education: Not on file  . Highest education level: Not on file  Occupational History  . Not on file  Social Needs  . Financial resource strain: Not on file  . Food insecurity:    Worry: Not on file    Inability: Not on file  . Transportation needs:    Medical: Not on file    Non-medical: Not on file  Tobacco Use  . Smoking status: Current Every Day Smoker    Packs/day: 1.00    Types: Cigarettes  . Smokeless tobacco: Never Used  Substance and Sexual Activity  . Alcohol use: Yes    Comment: 1 fifth of liquor daily or more   . Drug use: No  . Sexual activity: Not on file  Lifestyle  . Physical activity:    Days per week: Not on file    Minutes per session: Not on file  . Stress: Not on file  Relationships  . Social connections:    Talks on phone: Not on file    Gets together: Not on file    Attends religious service: Not on file    Active member of club or organization: Not on file    Attends meetings of clubs or organizations: Not on file    Relationship status: Not on file  Other Topics Concern  . Not on file  Social History Narrative  . Not on file   Family History  Problem Relation Age of Onset  . GER disease Mother    Allergies  Allergen Reactions  . Vicodin [Hydrocodone-Acetaminophen] Itching and Nausea Only   Prior to Admission medications   Medication Sig Start Date End Date Taking? Authorizing Provider  amphetamine-dextroamphetamine (ADDERALL) 10 MG tablet Take 20 mg by mouth 2 (two) times daily.  12/21/14  Yes [provider]  carbamazepine (TEGRETOL) 200 MG tablet 894m PO QD X 1D, then 6039mPO QD X 1D, then 40065mD X 1D, then 200m19m QD X 2D Patient  taking differently: Take 200-800 mg by mouth daily at 12 noon. 860m PO QD X 1D, then 6059mPO QD X 1D, then 40044mD X 1D, then 200m52m QD X 2D 06/11/18  Yes Ward, Kristen N, DO  fluticasone (FLONASE) 50 MCG/ACT nasal spray Place 2 sprays into both nostrils daily as needed for allergies.    Yes [provider]  folic acid (FOLVITE) 1 MG tablet Take 1 tablet (1 mg total) by mouth daily. 06/11/18  Yes Ward, Kristen N, DO  Hypromellose (ARTIFICIAL TEARS OP) Place 1 drop into both eyes as needed (dry eye).    Yes [provider]  insulin glargine (LANTUS) 100 UNIT/ML injection Inject 0.18 mLs (18 Units total) into the skin daily. Patient taking differently: Inject 24 Units into the skin at bedtime.  08/02/17  Yes Regalado, Belkys A, MD  magnesium oxide (MAG-OX) 400 (241.3  Mg) MG tablet Take 1 tablet (400 mg total) by mouth 2 (two) times daily. 08/02/17  Yes Regalado, Belkys A, MD  mupirocin ointment (BACTROBAN) 2 % Place 1 application into the nose 2 (two) times daily. For skin irritation   Yes [provider]  ondansetron (ZOFRAN ODT) 4 MG disintegrating tablet Take 1 tablet (4 mg total) by mouth every 6 (six) hours as needed for nausea or vomiting. 06/11/18  Yes Ward, KrisDelice Bison  PROAIR HFA 108 (90 Base) MCG/ACT inhaler Inhale 2 puffs into the lungs every 4 (four) hours as needed for wheezing. 07/05/17  Yes [provider]  propranolol (INDERAL) 10 MG tablet Take 10 mg by mouth 2 (two) times daily.   Yes [provider]  sertraline (ZOLOFT) 100 MG tablet Take 100 mg by mouth daily.   Yes [provider]  thiamine (VITAMIN B-1) 100 MG tablet Take 1 tablet (100 mg total) by mouth daily. 06/11/18  Yes Ward, KrisDelice Bison  ferrous sulfate 325 (65 FE) MG tablet Take 1 tablet (325 mg total) by mouth 2 (two) times daily with a meal. Patient not taking: Reported on 07/17/2018 08/01/17   RegaElmarie Shiley   Dg Ankle Complete Right  Result Date: 07/17/2018 CLINICAL DATA:  Right ankle fracture. EXAM: RIGHT ANKLE - COMPLETE 3+ VIEW COMPARISON:  None. FINDINGS: Overlying splint material is identified. Obliquely oriented fracture through the distal fibula. Medial malleolar fracture is also noted which appears mildly displaced. A small sliver of bone fragment is adjacent to the posterior malleolus. IMPRESSION: 1. Bilayer fracture is not identified. 2. Small bone fragment off the posterior malleolus also noted. A CT of the right ankle may be helpful to evaluate for posterior malleolar injury. Electronically Signed   By: TaylKerby Moors.   On: 07/17/2018 09:16   Dg Chest Portable 1 View  Result Date: 07/17/2018 CLINICAL DATA:  Seizure. EXAM: PORTABLE CHEST 1 VIEW COMPARISON:  06/10/2018 FINDINGS: The heart size and mediastinal contours are  within normal limits. Both lungs are clear. The visualized skeletal structures are unremarkable. IMPRESSION: No active disease. Electronically Signed   By: TaylKerby Moors.   On: 07/17/2018 07:37   Family History Reviewed and non-contributory, no pertinent history of problems with bleeding or anesthesia      Review of Systems 14 system ROS conducted and negative except for that noted in HPI   OBJECTIVE  Vitals: Patient Vitals for the past 8 hrs:  BP Temp Temp src Pulse Resp SpO2  07/17/18 2000 98/84 - - - 17 -  07/17/18 1935 - 99.4 F (  37.4 C) Axillary - - -  07/17/18 1900 117/75 - - - 17 -  07/17/18 1800 113/79 - - - 18 -  07/17/18 1700 (!) 133/91 - - - 20 -  07/17/18 1600 (!) 128/92 97.8 F (36.6 C) Axillary - 16 -  07/17/18 1430 132/79 - - (!) 101 18 97 %  07/17/18 1415 131/88 - - (!) 106 18 98 %  07/17/18 1400 (!) 137/91 - - (!) 110 17 98 %  07/17/18 1345 (!) 137/93 - - (!) 104 17 98 %  07/17/18 1330 (!) 137/97 - - (!) 102 15 97 %  07/17/18 1320 (!) 133/97 - - (!) 110 - -  07/17/18 1315 (!) 133/97 - - (!) 103 17 96 %  07/17/18 1300 (!) 139/103 - - (!) 104 13 99 %  07/17/18 1245 (!) 136/95 - - (!) 104 17 97 %   General: Alert, no acute distress Cardiovascular: No pedal edema Respiratory: No cyanosis, no use of accessory musculature GI: No organomegaly, abdomen is soft and non-tender Skin: No lesions in the area of chief complaint other than those listed below in MSK exam.  Neurologic: Sensation intact distally save for the below mentioned MSK exam Psychiatric: Patient is competent for consent with normal mood and affect Lymphatic: No swelling other than the area located below. Extremities  RLE: splint CDI, +EHL though remainder of motor difficult to test due to splint, sensation intact distally with warm well perfused foot, no pain w passive stretch     Test Results Imaging X-rays after splinting demonstrate a well located mortise with a trimalleolar ankle  fracture with a small avulsion of the posterior aspect of the tibia.  Labs cbc Recent Labs    07/17/18 0651  WBC 9.5  HGB 14.4  HCT 42.9  PLT 183    Labs inflam No results for input(s): CRP in the last 72 hours.  Invalid input(s): ESR  Labs coag No results for input(s): INR, PTT in the last 72 hours.  Invalid input(s): PT  Recent Labs    07/17/18 0651  NA 131*  K 4.2  CL 91*  CO2 23  GLUCOSE 198*  BUN 6  CREATININE 0.60  CALCIUM 8.6*     ASSESSMENT AND PLAN: 28 y.o. female with the following: Trimalleolar ankle fracture  It is a young patient the patient would benefit from surgical management of displaced fracture.  As she is clearly in withdrawals from alcohol use we do not feel that she is stable for surgery currently.  She will likely need surgery in the next few days.  We will work on scheduling based on her medical improvement.  She may even be discharged and have outpatient surgery if that is what her primary team feels appropriate for.  We talked about risk benefits alternatives of surgery including but not limited to infection, hardware pain, stiffness and skin breakdown.  She understands all of these things.  Work on surgical scheduling based on her medical improvement.

## 2018-07-17 NOTE — ED Notes (Signed)
Attempted phone call to receiving floor to give report, floor reported that receiving RN was in another room and would return phone call.

## 2018-07-17 NOTE — ED Notes (Signed)
Pt request assistance for toileting at 710 am. Was disconnected from monitor, pt have footwear protection and was ambulating without any assitance required. Urine sample supplies were giving to family and they were ready to go to the bathroom, family refuse any other assistance at 719. Pharmacy tech came into the room for unknowledge purpose. As soon I came out of the door I hear some noise inside the room. Pt fall into the flor at 722 and was actively seizing, assistance was provided and right ankle injury was noticed and treated.

## 2018-07-17 NOTE — ED Notes (Signed)
Per Dr Anitra Lauth, CIWA should be completed every hour

## 2018-07-17 NOTE — ED Notes (Signed)
Paged PCCM per RN Clydie Braun

## 2018-07-17 NOTE — Consult Note (Signed)
NAME:  Angela Villegas, MRN:  [id number], DOB:  02-20-1991, LOS: 0 ADMISSION DATE:  07/17/2018, CONSULTATION DATE:  07/17/2018 REFERRING MD: Anselm PancoastJoy, Shawn C, PA-C  CHIEF COMPLAINT:  Alcohol withdrawal   Brief History   28 year old female with chronic alcohol abuse admitted after two alcohol withdrawal seizures.  History of present illness    Angela Villegas is a 28yo female with a pmhx significant for alcohol use disorder, prior alcohol withdrawal, cirrhosis, psoriasis, ADD, and TIIDM presenting with seizure from home. History was provided by the husband at bedside. Patient recently decided to quit drinking. She was previously drinking over 1/5 of liquor a day and tapered herself down to a 6 pack of beer over the past week. She was seen by a physician who prescribed her a 4 day course of a carbamazepine taper. She took her first dose of carbamazepine yesterday evening 2 hours after her last drink. At 5 am this morning, husband noted patient became unresponsive in bed with posturing, deviated gaze, and tongue biting. The episode lasted about a minute and a half. He called EMS. After arrival to the ED, patient experienced a second witness seizure and treated with IM ativan. During this episode she fell and twisted her ankle. Plain films demonstrated a distal fibular fracture and medial malleolar fracture. Husband reports she has been drinking regularly since the age of 28 and heavily for the past five years. She has attempted to quit several times. Husband reports two prior hospitalizations for unrelated reasons requiring extended stay due to alcohol withdrawal and detox. She has never experienced a withdrawal seizure before. She has been unable to eat the past past four days and has had non-bloody emesis.    Past Medical History  Type II DM Cirrhosis  Psoriasis  Alcohol use disorder Depression  Significant Hospital Events   1/5 > Seizure, admit to ICU  Consults:  Orthopedic surgery   Procedures:   None  Significant Diagnostic Tests:  1/5 Right Ankle Xray > Distal fibular fracture, medial malleolar fracture  Micro Data:  none  Antimicrobials:  none  Interim history/subjective:  A&O x 3 in ED on precedex drip. Her speech is difficult due to her tongue injury. Complaining of pain in her ankle.   Objective   Blood pressure 132/79, pulse (!) 101, temperature 98.3 F (36.8 C), temperature source Oral, resp. rate 18, height 5\' 1"  (1.549 m), weight 70.8 kg, last menstrual period 06/16/2018, SpO2 97 %.        Intake/Output Summary (Last 24 hours) at 07/17/2018 1501 Last data filed at 07/17/2018 0919 Gross per 24 hour  Intake 1000 ml  Output -  Net 1000 ml   Filed Weights   07/17/18 0635  Weight: 70.8 kg    Examination: General: drowsy, arrousable to voice, diaphoretic, difficult to understand due to swollen tongue, no scleral icterus HENT: dried blood around mouth, tongue swollen not obstructing airway with left laceration and oozing, dried blood in right nare Lungs: CTA, no w/r/r Cardiovascular: tachycardic, regular rhythm, II/VI systolic murmur LUSB Abdomen: distended, soft, NTTP, +BS Extremities: moving all extremities, no edema, cast covering right calf and ankle, no asterixis  Neuro: a&ox3, following commands Skin: silver cracked plaques bilateral ears L>R and umbilicus without cracked skin, otherwise c/d/i   Resolved Hospital Problem list     Assessment & Plan:  28yo female with history of alcoholic cirrhosis, alcohol abuse, eczema and TIIDM presenting with first time seizures 2/2 alcohol withdrawal with last drink yesterday. Recently prescribed  carbamazepine four day taper as she was trying to quit using alcohol.   Alcohol Withdrawal Seizure: Self tapered off alcohol, presented with two withdrawal seizures. Last drink 1/4. - NPO, holding home meds - ICU CIWA protocol  - Precedex gtt - Ativan q8h IV; keppra 500 mg q12h - seizure precautions  - zofran prn -  Mg, Phos, UA, UDS ordered   - am CMP, CBC  - cont. Folic acid, thiamine - telemetry  - 1L NS bolus in ED,   Alcoholic Cirrhosis: On propanolol BID at home. Has required two paracentesis previously.  - Monitor mental status  - Holding propanolol while NPO   Right Distal Oblique Fibular Fx Orthopedics consulted and leg splinted in the ED. She will require surgery once stable.   TIIDM Normally takes lantus 24U qhs - SSI q4h  Psoriasis Psoriatic lesions bilateral ears, umbilical area, and nipple. Using mupirocin previously.  - fluocinonide .05%   Tongue Laceration  - mouth care per routine  ADD - hold adderall   Best practice:  Diet: NPO Pain/Anxiety/Delirium protocol (if indicated): n/a VAP protocol (if indicated): n/a DVT prophylaxis: Lovenox GI prophylaxis: IV Pepcid BID Glucose control: q4h CBG & SSI Mobility: Per RN as tolerated Code Status: FULL Family Communication: Husband updated at bedside  Disposition: ICU  Labs   CBC: Recent Labs  Lab 07/17/18 0651  WBC 9.5  NEUTROABS 8.2*  HGB 14.4  HCT 42.9  MCV 85.5  PLT 183    Basic Metabolic Panel: Recent Labs  Lab 07/17/18 0651  NA 131*  K 4.2  CL 91*  CO2 23  GLUCOSE 198*  BUN 6  CREATININE 0.60  CALCIUM 8.6*   GFR: Estimated Creatinine Clearance: 94.2 mL/min (by C-G formula based on SCr of 0.6 mg/dL). Recent Labs  Lab 07/17/18 0651  WBC 9.5    Liver Function Tests: Recent Labs  Lab 07/17/18 0651  AST 202*  ALT 34  ALKPHOS 114  BILITOT 2.9*  PROT 8.8*  ALBUMIN 3.7   No results for input(s): LIPASE, AMYLASE in the last 168 hours. No results for input(s): AMMONIA in the last 168 hours.  ABG    Component Value Date/Time   HCO3 26.6 09/17/2017 1700   TCO2 28 09/17/2017 1700   O2SAT 96.0 09/17/2017 1700     Coagulation Profile: No results for input(s): INR, PROTIME in the last 168 hours.  Cardiac Enzymes: No results for input(s): CKTOTAL, CKMB, CKMBINDEX, TROPONINI in  the last 168 hours.  HbA1C: Hgb A1c MFr Bld  Date/Time Value Ref Range Status  07/27/2017 08:02 AM 11.5 (H) 4.8 - 5.6 % Final    Comment:    (NOTE) Pre diabetes:          5.7%-6.4% Diabetes:              >6.4% Glycemic control for   <7.0% adults with diabetes     CBG: Recent Labs  Lab 07/17/18 0701 07/17/18 1401  GLUCAP 197* 123*    Review of Systems:   Per HPI  Past Medical History  She,  has a past medical history of ADD (attention deficit disorder), Alcohol abuse, Asthma, Diabetes mellitus ([facility]), Diabetes mellitus without complication ([facility]), Hypertension, and Liver disease, unspecified.   Surgical History    Past Surgical History:  Procedure Laterality Date  . EXCISION MASS NECK Right 07/28/2017   Procedure: exploration of right neck control of hemorrhage;  Surgeon: Osborn Coho, MD;  Location: [facility] OR;  Service: ENT;  Laterality: Right;  .  INCISION AND DRAINAGE ABSCESS Right 07/27/2017   Procedure: INCISION AND DRAINAGE ABSCESS;  Surgeon: Osborn Coho, MD;  Location: [facility] OR;  Service: ENT;  Laterality: Right;     Social History   reports that she has been smoking cigarettes. She has been smoking about 1.00 pack per day. She has never used smokeless tobacco. She reports current alcohol use. She reports that she does not use drugs.   Family History   Her family history includes GER disease in her mother.   Allergies Allergies  Allergen Reactions  . Vicodin [Hydrocodone-Acetaminophen] Itching and Nausea Only     Home Medications  Prior to Admission medications   Medication Sig Start Date End Date Taking? Authorizing Provider  amphetamine-dextroamphetamine (ADDERALL) 10 MG tablet Take 20 mg by mouth 2 (two) times daily.  12/21/14  Yes [provider]  carbamazepine (TEGRETOL) 200 MG tablet 800mg  PO QD X 1D, then 600mg  PO QD X 1D, then 400mg  QD X 1D, then 200mg  PO QD X 2D Patient taking differently: Take 200-800 mg by mouth daily at 12 noon. 800mg  PO  QD X 1D, then 600mg  PO QD X 1D, then 400mg  QD X 1D, then 200mg  PO QD X 2D 06/11/18  Yes Ward, Kristen N, DO  fluticasone (FLONASE) 50 MCG/ACT nasal spray Place 2 sprays into both nostrils daily as needed for allergies.    Yes [provider]  folic acid (FOLVITE) 1 MG tablet Take 1 tablet (1 mg total) by mouth daily. 06/11/18  Yes Ward, Kristen N, DO  Hypromellose (ARTIFICIAL TEARS OP) Place 1 drop into both eyes as needed (dry eye).    Yes [provider]  insulin glargine (LANTUS) 100 UNIT/ML injection Inject 0.18 mLs (18 Units total) into the skin daily. Patient taking differently: Inject 24 Units into the skin at bedtime.  08/02/17  Yes Regalado, Belkys A, MD  magnesium oxide (MAG-OX) 400 (241.3 Mg) MG tablet Take 1 tablet (400 mg total) by mouth 2 (two) times daily. 08/02/17  Yes Regalado, Belkys A, MD  mupirocin ointment (BACTROBAN) 2 % Place 1 application into the nose 2 (two) times daily. For skin irritation   Yes [provider]  ondansetron (ZOFRAN ODT) 4 MG disintegrating tablet Take 1 tablet (4 mg total) by mouth every 6 (six) hours as needed for nausea or vomiting. 06/11/18  Yes Ward, Layla Maw, DO  PROAIR HFA 108 (90 Base) MCG/ACT inhaler Inhale 2 puffs into the lungs every 4 (four) hours as needed for wheezing. 07/05/17  Yes [provider]  propranolol (INDERAL) 10 MG tablet Take 10 mg by mouth 2 (two) times daily.   Yes [provider]  sertraline (ZOLOFT) 100 MG tablet Take 100 mg by mouth daily.   Yes [provider]  thiamine (VITAMIN B-1) 100 MG tablet Take 1 tablet (100 mg total) by mouth daily. 06/11/18  Yes Ward, Layla Maw, DO  ferrous sulfate 325 (65 FE) MG tablet Take 1 tablet (325 mg total) by mouth 2 (two) times daily with a meal. Patient not taking: Reported on 07/17/2018 08/01/17   Alba Cory, MD     Critical care time:

## 2018-07-17 NOTE — H&P (Signed)
Angela Villegas  RAJ:[id number] DOB: 1990/08/26 DOA: 07/17/2018  Admitted: 07/17/2018 PCP: Shirlean Mylar, MD    Reason for Consult / Chief Complaint:  Alcohol withdrawal  Consulting MD:  Max Fickle, MD  HPI/Brief Narrative   Angela Villegas is a 28yo female with cirrhosis, alcohol abuse and withdrawal, eczema, ADD, and TIIDM presenting with tachycardia and seizurex2 secondary to alcohol withdrawal. During the second seizure she bit her tongue, fell and experienced an oblique fx of the right fibula. She presented tachycardic, protecting her own airway, and is arousable to voice and oriented. She usually drinks a 1/2 liter of liquor per day and tapered down to her last six pack yesterday. She was prescribed a four day carbamazepine taper and took the first dose yesterday and began experiencing shakes two hours after her last drink. She has drank since age 9 and heavily for the past five years and has attempted to quit several times. She has experienced withdrawal twice before but never had a seizure.   Subjective  Her husband supplied most of her history. He states she has not eaten much the past four days and has been vomiting bile, no BRB. Her first seizure was in bed and lasted about 1.5 minutes. Her second seizure occurred after arrival to the ED and lasted around 3 minutes. She has been having BM with no change in color, no diarrhea. She has been having ongoing eczema flare which she picks at and has bleeding on both ears. She additionally has had recent congestion and has been blowing her nose frequently causing nose bleeds. Husband states she does not use other drugs and smoke 1ppd.   Assessment & Plan:  28yo female with history of alcoholic cirrhosis, alcohol abuse, eczema and TIIDM presenting with first time seizures 2/2 alcohol withdrawal with last drink yesterday. Recently prescribed carbamazepine four day taper as she was trying to quit using alcohol.   Alcohol Withdrawal Alcoholic  Cirrhosis  - stop carbamazepine  - CIWA protocol  - cont. Precedex, ativan prn  - seizure precautions  - hold home propranolol  - cont. Home thiamine, folate - zofran prn - Pepcid  - Mg, Phos, UA, UDS ordered   - am CMP, CBC  - telemetry  - diet NPO  - daily weights, strict I/O's  Right Distal Oblique Fibular Fx Orthopedics consulted and leg splinted in the ED. She will require surgery once stable.   TIIDM Normally takes lantus 24U qhs - SSI q4h  Psoriasis Psoriatic lesions bilateral ears, umbilical area, and nipple. Using mupirocin previously.  - fluocinonide .05%   Tongue Laceration  - mouth care per routine  ADD - hold adderall   Best Practice / Goals of Care / Disposition.   DVT prophylaxis: lovenox GI prophylaxis: pepcid IV 20mg  bid Diet: NPO Mobility: nurse to assess Code Status: full Family Communication: discussed with husband at bedside  Disposition / Summary of Today's Plan 07/17/18   Patient currently stable and protecting airway, does not require intubation. CIWA protocol with precedex with frequent neuro checks. Ortho consulted for fibular fracture.   Consultants:  none  Procedures: none  Significant Diagnostic Tests: none  Micro Data: none  Antimicrobials:  none   Objective    Examination: General: drowsy, arrousable to voice, diaphoretic, difficult to understand due to swollen tongue HENT: dried blood around mouth, tongue swollen not obstructing airway with left laceration and oozing, dried blood in right nare Lungs: CTA, no w/r/r Cardiovascular: tachycardic, regular rhythm, II/VI systolic murmur LUSB  Abdomen: distended, soft, NTTP, +BS Extremities: moving all extremities, no edema, cast covering right calf and ankle Neuro: a&ox3, following commands Skin: silver cracked plaques bilateral ears L>R and umbilicus without cracked skin, otherwise c/d/i   Blood pressure (!) 133/97, pulse (!) 110, temperature 98.3 F (36.8 C),  temperature source Oral, resp. rate 13, height 5\' 1"  (1.549 m), weight 70.8 kg, last menstrual period 06/16/2018, SpO2 99 %. Marland Kitchen dexmedetomidine (PRECEDEX) IV infusion 0.3 mcg/kg/hr (07/17/18 1334)          Intake/Output Summary (Last 24 hours) at 07/17/2018 1335 Last data filed at 07/17/2018 0919 Gross per 24 hour  Intake 1000 ml  Output -  Net 1000 ml   Filed Weights   07/17/18 0635  Weight: 70.8 kg     Labs   CBC: Recent Labs  Lab 07/17/18 0651  WBC 9.5  NEUTROABS 8.2*  HGB 14.4  HCT 42.9  MCV 85.5  PLT 183   Basic Metabolic Panel: Recent Labs  Lab 07/17/18 0651  NA 131*  K 4.2  CL 91*  CO2 23  GLUCOSE 198*  BUN 6  CREATININE 0.60  CALCIUM 8.6*   GFR: Estimated Creatinine Clearance: 94.2 mL/min (by C-G formula based on SCr of 0.6 mg/dL). Recent Labs  Lab 07/17/18 0651  WBC 9.5   Liver Function Tests: Recent Labs  Lab 07/17/18 0651  AST 202*  ALT 34  ALKPHOS 114  BILITOT 2.9*  PROT 8.8*  ALBUMIN 3.7   No results for input(s): LIPASE, AMYLASE in the last 168 hours. No results for input(s): AMMONIA in the last 168 hours. ABG    Component Value Date/Time   HCO3 26.6 09/17/2017 1700   TCO2 28 09/17/2017 1700   O2SAT 96.0 09/17/2017 1700    Coagulation Profile: No results for input(s): INR, PROTIME in the last 168 hours. Cardiac Enzymes: No results for input(s): CKTOTAL, CKMB, CKMBINDEX, TROPONINI in the last 168 hours. HbA1C: Hgb A1c MFr Bld  Date/Time Value Ref Range Status  07/27/2017 08:02 AM 11.5 (H) 4.8 - 5.6 % Final    Comment:    (NOTE) Pre diabetes:          5.7%-6.4% Diabetes:              >6.4% Glycemic control for   <7.0% adults with diabetes    CBG: Recent Labs  Lab 07/17/18 0701  GLUCAP 197*     Review of Systems:     Past medical history   Past Medical History:  Diagnosis Date  . ADD (attention deficit disorder)   . Alcohol abuse   . Asthma   . Diabetes mellitus (HCC)   . Diabetes mellitus without  complication (HCC)   . Hypertension   . Liver disease, unspecified    Social History   Social History   Socioeconomic History  . Marital status: Married    Spouse name: Not on file  . Number of children: Not on file  . Years of education: Not on file  . Highest education level: Not on file  Occupational History  . Not on file  Social Needs  . Financial resource strain: Not on file  . Food insecurity:    Worry: Not on file    Inability: Not on file  . Transportation needs:    Medical: Not on file    Non-medical: Not on file  Tobacco Use  . Smoking status: Current Every Day Smoker    Packs/day: 1.00    Types: Cigarettes  .  Smokeless tobacco: Never Used  Substance and Sexual Activity  . Alcohol use: Yes    Comment: 1 fifth of liquor daily or more   . Drug use: No  . Sexual activity: Not on file  Lifestyle  . Physical activity:    Days per week: Not on file    Minutes per session: Not on file  . Stress: Not on file  Relationships  . Social connections:    Talks on phone: Not on file    Gets together: Not on file    Attends religious service: Not on file    Active member of club or organization: Not on file    Attends meetings of clubs or organizations: Not on file    Relationship status: Not on file  . Intimate partner violence:    Fear of current or ex partner: Not on file    Emotionally abused: Not on file    Physically abused: Not on file    Forced sexual activity: Not on file  Other Topics Concern  . Not on file  Social History Narrative  . Not on file    Family history    Family History  Problem Relation Age of Onset  . GER disease Mother     Allergies Allergies  Allergen Reactions  . Vicodin [Hydrocodone-Acetaminophen] Itching and Nausea Only    Home meds  Prior to Admission medications   Medication Sig Start Date End Date Taking? Authorizing Provider  amphetamine-dextroamphetamine (ADDERALL) 10 MG tablet Take 20 mg by mouth 2 (two) times  daily.  12/21/14  Yes [provider]  carbamazepine (TEGRETOL) 200 MG tablet 800mg  PO QD X 1D, then 600mg  PO QD X 1D, then 400mg  QD X 1D, then 200mg  PO QD X 2D Patient taking differently: Take 200-800 mg by mouth daily at 12 noon. 800mg  PO QD X 1D, then 600mg  PO QD X 1D, then 400mg  QD X 1D, then 200mg  PO QD X 2D 06/11/18  Yes Ward, Kristen N, DO  fluticasone (FLONASE) 50 MCG/ACT nasal spray Place 2 sprays into both nostrils daily as needed for allergies.    Yes [provider]  folic acid (FOLVITE) 1 MG tablet Take 1 tablet (1 mg total) by mouth daily. 06/11/18  Yes Ward, Kristen N, DO  Hypromellose (ARTIFICIAL TEARS OP) Place 1 drop into both eyes as needed (dry eye).    Yes [provider]  insulin glargine (LANTUS) 100 UNIT/ML injection Inject 0.18 mLs (18 Units total) into the skin daily. Patient taking differently: Inject 24 Units into the skin at bedtime.  08/02/17  Yes Regalado, Belkys A, MD  magnesium oxide (MAG-OX) 400 (241.3 Mg) MG tablet Take 1 tablet (400 mg total) by mouth 2 (two) times daily. 08/02/17  Yes Regalado, Belkys A, MD  mupirocin ointment (BACTROBAN) 2 % Place 1 application into the nose 2 (two) times daily. For skin irritation   Yes [provider]  ondansetron (ZOFRAN ODT) 4 MG disintegrating tablet Take 1 tablet (4 mg total) by mouth every 6 (six) hours as needed for nausea or vomiting. 06/11/18  Yes Ward, Layla Maw, DO  PROAIR HFA 108 (90 Base) MCG/ACT inhaler Inhale 2 puffs into the lungs every 4 (four) hours as needed for wheezing. 07/05/17  Yes [provider]  propranolol (INDERAL) 10 MG tablet Take 10 mg by mouth 2 (two) times daily.   Yes [provider]  sertraline (ZOLOFT) 100 MG tablet Take 100 mg by mouth daily.   Yes [provider]  thiamine (VITAMIN B-1) 100 MG tablet Take 1 tablet (100 mg total) by mouth daily. 06/11/18  Yes Ward, Layla MawKristen N, DO  ferrous sulfate 325 (65 FE) MG tablet Take 1 tablet  (325 mg total) by mouth 2 (two) times daily with a meal. Patient not taking: Reported on 07/17/2018 08/01/17   Regalado, Jon BillingsBelkys A, MD     LOS: 0 days

## 2018-07-17 NOTE — ED Notes (Addendum)
Pt. On purewick. Pt. Has not produce urine for UA at this time

## 2018-07-17 NOTE — ED Notes (Signed)
Critical care MD bedside

## 2018-07-18 ENCOUNTER — Inpatient Hospital Stay (HOSPITAL_COMMUNITY): Payer: BC Managed Care – PPO

## 2018-07-18 DIAGNOSIS — K7030 Alcoholic cirrhosis of liver without ascites: Secondary | ICD-10-CM

## 2018-07-18 DIAGNOSIS — F10239 Alcohol dependence with withdrawal, unspecified: Secondary | ICD-10-CM

## 2018-07-18 DIAGNOSIS — R569 Unspecified convulsions: Secondary | ICD-10-CM

## 2018-07-18 LAB — COMPREHENSIVE METABOLIC PANEL
ALT: 27 U/L (ref 0–44)
ANION GAP: 13 (ref 5–15)
AST: 142 U/L — ABNORMAL HIGH (ref 15–41)
Albumin: 3.1 g/dL — ABNORMAL LOW (ref 3.5–5.0)
Alkaline Phosphatase: 89 U/L (ref 38–126)
BUN: 11 mg/dL (ref 6–20)
CO2: 19 mmol/L — ABNORMAL LOW (ref 22–32)
Calcium: 7.7 mg/dL — ABNORMAL LOW (ref 8.9–10.3)
Chloride: 105 mmol/L (ref 98–111)
Creatinine, Ser: 0.56 mg/dL (ref 0.44–1.00)
GFR calc Af Amer: >60 mL/min (ref >60)
GFR calc non Af Amer: >60 mL/min (ref >60)
Glucose, Bld: 94 mg/dL (ref 70–99)
Potassium: 3.9 mmol/L (ref 3.5–5.1)
Sodium: 137 mmol/L (ref 135–145)
Total Bilirubin: 2.6 mg/dL — ABNORMAL HIGH (ref 0.3–1.2)
Total Protein: 7.2 g/dL (ref 6.5–8.1)

## 2018-07-18 LAB — CBC
HCT: 35.8 % — ABNORMAL LOW (ref 36.0–46.0)
HEMOGLOBIN: 11.8 g/dL — AB (ref 12.0–15.0)
MCH: 28.9 pg (ref 26.0–34.0)
MCHC: 33 g/dL (ref 30.0–36.0)
MCV: 87.7 fL (ref 80.0–100.0)
NRBC: 0 % (ref 0.0–0.2)
Platelets: 113 10*3/uL — ABNORMAL LOW (ref 150–400)
RBC: 4.08 MIL/uL (ref 3.87–5.11)
RDW: 14 % (ref 11.5–15.5)
WBC: 6.1 10*3/uL (ref 4.0–10.5)

## 2018-07-18 LAB — GLUCOSE, CAPILLARY
GLUCOSE-CAPILLARY: 101 mg/dL — AB (ref 70–99)
Glucose-Capillary: 98 mg/dL (ref 70–99)
Glucose-Capillary: 100 mg/dL — ABNORMAL HIGH (ref 70–99)
Glucose-Capillary: 121 mg/dL — ABNORMAL HIGH (ref 70–99)
Glucose-Capillary: 89 mg/dL (ref 70–99)

## 2018-07-18 MED ORDER — LORAZEPAM 2 MG/ML IJ SOLN
1.0000 mg | INTRAMUSCULAR | Status: DC | PRN
  Administered 2018-07-19: 1 mg via INTRAVENOUS
  Filled 2018-07-18: qty 1

## 2018-07-18 MED ORDER — MAGNESIUM SULFATE 4 GM/100ML IV SOLN
4.0000 g | Freq: Once | INTRAVENOUS | Status: AC
  Administered 2018-07-18: 4 g via INTRAVENOUS
  Filled 2018-07-18: qty 100

## 2018-07-18 MED ORDER — SERTRALINE HCL 100 MG PO TABS
100.0000 mg | ORAL_TABLET | Freq: Every day | ORAL | Status: DC
  Administered 2018-07-18 – 2018-07-26 (×8): 100 mg via ORAL
  Filled 2018-07-18 (×9): qty 1

## 2018-07-18 MED ORDER — SODIUM CHLORIDE 0.9 % IV SOLN
INTRAVENOUS | Status: DC
  Administered 2018-07-18 – 2018-07-19 (×3): via INTRAVENOUS

## 2018-07-18 MED ORDER — LORAZEPAM 2 MG/ML IJ SOLN: 1.0000 mg | INTRAMUSCULAR | Status: DC | PRN

## 2018-07-18 MED ORDER — FOLIC ACID 1 MG PO TABS
1.0000 mg | ORAL_TABLET | Freq: Every day | ORAL | Status: DC
  Filled 2018-07-18: qty 1

## 2018-07-18 MED ORDER — MAGIC MOUTHWASH W/LIDOCAINE
5.0000 mL | Freq: Three times a day (TID) | ORAL | Status: DC | PRN
  Administered 2018-07-18 – 2018-07-19 (×2): 5 mL via ORAL
  Filled 2018-07-18 (×7): qty 5

## 2018-07-18 MED ORDER — IBUPROFEN 200 MG PO TABS: 400.0000 mg | ORAL_TABLET | Freq: Four times a day (QID) | ORAL | Status: DC | PRN

## 2018-07-18 MED ORDER — IBUPROFEN 100 MG/5ML PO SUSP
400.0000 mg | Freq: Four times a day (QID) | ORAL | Status: DC | PRN
  Administered 2018-07-18 – 2018-07-19 (×3): 400 mg via ORAL
  Filled 2018-07-18 (×4): qty 20

## 2018-07-18 MED ORDER — PROPRANOLOL HCL 10 MG PO TABS
10.0000 mg | ORAL_TABLET | Freq: Every day | ORAL | Status: DC
  Administered 2018-07-18 – 2018-07-19 (×2): 10 mg via ORAL
  Filled 2018-07-18 (×3): qty 1

## 2018-07-18 MED ORDER — FLUOCINONIDE 0.05 % EX CREA
TOPICAL_CREAM | Freq: Two times a day (BID) | CUTANEOUS | Status: DC
  Administered 2018-07-18 – 2018-07-21 (×6): via TOPICAL
  Administered 2018-07-21: 1 via TOPICAL
  Administered 2018-07-22 – 2018-07-25 (×7): via TOPICAL
  Filled 2018-07-18: qty 15

## 2018-07-18 NOTE — Progress Notes (Signed)
Report called to 2w13

## 2018-07-18 NOTE — ED Provider Notes (Signed)
Reduction of dislocation Date/Time: 07/18/2018 10:36 PM Performed by: Gwyneth Sprout, MD Authorized by: Gwyneth Sprout, MD  Consent: The procedure was performed in an emergent situation. Verbal consent obtained. Consent given by: spouse Patient understanding: patient does not state understanding of the procedure being performed Imaging studies: imaging studies not available Patient identity confirmed: verbally with patient Local anesthesia used: no  Anesthesia: Local anesthesia used: no  Sedation: Patient sedated: no  Patient tolerance: Patient tolerated the procedure well with no immediate complications Comments: Dislocation of the right ankle with weak pulse and color change of foot.  Dislocation reduced and color returned and strong pulse.  Pt was post ictal and did not show signs of pain during the procedure.       Gwyneth Sprout, MD 07/18/18 2238

## 2018-07-18 NOTE — Progress Notes (Addendum)
NAME:  Angela Villegas, MRN:  [id number], DOB:  August 29, 1990, LOS: 1 ADMISSION DATE:  07/17/2018, CONSULTATION DATE:  07/17/2018 REFERRING MD:  ED CHIEF COMPLAINT:  Seizures  Brief History   Angela Villegas is a 28yo female with cirrhosis, alcohol abuse and withdrawal, eczema, ADD, and TIIDM presenting with tachycardia and seizurex2 secondary to alcohol withdrawal  History of present illness   28yo female with cirrhosis, alcohol abuse and withdrawal, eczema, ADD, and TIIDM presenting with tachycardia and seizurex2 secondary to alcohol withdrawal. During the second seizure she bit her tongue, fell and experienced an oblique fx of the right fibula. She presented tachycardic, protecting her own airway, and is arousable to voice and oriented. She usually drinks a 1/2 liter of liquor per day and tapered down to her last six pack yesterday. She was prescribed a four day carbamazepine taper and took the first dose yesterday and began experiencing shakes two hours after her last drink. She has drank since age 32 and heavily for the past five years and has attempted to quit several times. She has experienced withdrawal twice before but never had a seizure.   Past Medical History  Alcoholic cirrhosis  Alcohol use disorder HTN TIIDM Psoriasis   Significant Hospital Events   none  Consults:  ortho  Procedures:  none  Significant Diagnostic Tests:  none  Micro Data:  none  Antimicrobials:  none  Interim history/subjective:  Patient having pain from where she bit her tongue. She is otherwise feeling ok. Husband is at bedside.  Difficulty urinating today and yesterday. This is a new issue. She does not have dysuria.   Objective   Blood pressure (!) 128/91, pulse 74, temperature 98.5 F (36.9 C), temperature source Oral, resp. rate 15, height 5\' 1"  (1.549 m), weight 73.7 kg, last menstrual period 06/16/2018, SpO2 96 %.        Intake/Output Summary (Last 24 hours) at 07/18/2018 0704 Last data filed  at 07/18/2018 0600 Gross per 24 hour  Intake 1386.78 ml  Output 950 ml  Net 436.78 ml   Filed Weights   07/17/18 0635 07/18/18 0352  Weight: 70.8 kg 73.7 kg   . dexmedetomidine Stopped (07/18/18 0549)  . famotidine (PEPCID) IV Stopped (07/17/18 2220)  . levETIRAcetam 400 mL/hr at 07/18/18 0600    Examination: General: NAD, supine in bed HENT: macerated area of left tongue, tongue diffusely swollen, otherwise [place]/AT Lungs: CTA, no w/r/r Cardiovascular: tachycardic, regular rhythm, II/VI systolic murmur LUSB Abdomen: soft, non-distended, NTTP Extremities: moving all extremities, splint in place on right foot, strength 5/5 extremities Neuro: A&Ox3, cooperative, CN II-XII intact   Resolved Hospital Problem list     Assessment & Plan:    Alcohol Withdrawal Seizure No recurrence of seizures. Off precedex and alert and oriented without signs of withdrawal.  - CIWA protocol with ativan prn - scheduled ativan for seizure prophylaxis - CLD - keppra 500mg  q12h - EEG, CT - neuro consult afterward as first time seizure - seizure precautions  - folic acid, thiamine  - zofran prn    Alcoholic Cirrhosis - cont. Home propranolol for varices prophylaxis - am CMP   Right Distal Oblique Fibular Fx Ortho consulted.  - she should be stable for surgery in two days  Urinary Retention Since her seizure. Possibly medication induced with tegretol. Has had In & Out cath x2 with last one 700cc o/p - I/O cath prn  - foley if third I/o cath  TIIDM - SSI moderate   Psoriasis Fluocinonide .05%  cream  Tongue Lac Swelling decreased but painful, causing slurring. Discussed with ENT, recommend oral and topical pain medication as needed.  - SLP eval  - Ibuprofen 400 mg q6h prn  - magic mouthwash with lidocaine TID prn  - chlorseptic spray   ADD - holding adderall   Depression -home sertraline   Best practice:  Diet: CLD Pain/Anxiety/Delirium protocol (if indicated): yes VAP  protocol (if indicated): n/a DVT prophylaxis: lovenox GI prophylaxis: none Glucose control: SSI q4h Mobility: nurse to assess Code Status: full Family Communication: discussed with husband at bedside Disposition: ICU   Labs   CBC: Recent Labs  Lab 07/17/18 0651  WBC 9.5  NEUTROABS 8.2*  HGB 14.4  HCT 42.9  MCV 85.5  PLT 183    Basic Metabolic Panel: Recent Labs  Lab 07/17/18 0651 07/17/18 1539 07/18/18 0351  NA 131*  --  PENDING  K 4.2  --  PENDING  CL 91*  --  PENDING  CO2 23  --  PENDING  GLUCOSE 198*  --  94  BUN 6  --  11  CREATININE 0.60  --  0.56  CALCIUM 8.6*  --  PENDING  MG  --  1.4*  --   PHOS  --  4.4  --    GFR: Estimated Creatinine Clearance: 96.2 mL/min (by C-G formula based on SCr of 0.56 mg/dL). Recent Labs  Lab 07/17/18 0651  WBC 9.5    Liver Function Tests: Recent Labs  Lab 07/17/18 0651 07/18/18 0351  AST 202* 142*  ALT 34 27  ALKPHOS 114 89  BILITOT 2.9* 2.6*  PROT 8.8* 7.2  ALBUMIN 3.7 3.1*   No results for input(s): LIPASE, AMYLASE in the last 168 hours. No results for input(s): AMMONIA in the last 168 hours.  ABG    Component Value Date/Time   HCO3 26.6 09/17/2017 1700   TCO2 28 09/17/2017 1700   O2SAT 96.0 09/17/2017 1700     Coagulation Profile: No results for input(s): INR, PROTIME in the last 168 hours.  Cardiac Enzymes: No results for input(s): CKTOTAL, CKMB, CKMBINDEX, TROPONINI in the last 168 hours.  HbA1C: Hgb A1c MFr Bld  Date/Time Value Ref Range Status  07/27/2017 08:02 AM 11.5 (H) 4.8 - 5.6 % Final    Comment:    (NOTE) Pre diabetes:          5.7%-6.4% Diabetes:              >6.4% Glycemic control for   <7.0% adults with diabetes     CBG: Recent Labs  Lab 07/17/18 1401 07/17/18 1653 07/17/18 1932 07/17/18 2340 07/18/18 0344  GLUCAP 123* 105* 110* 133* 98    Review of Systems:     Past Medical History  She,  has a past medical history of ADD (attention deficit disorder), Alcohol  abuse, Asthma, Diabetes mellitus (HCC), Diabetes mellitus without complication (HCC), Hypertension, and Liver disease, unspecified.   Surgical History    Past Surgical History:  Procedure Laterality Date  . EXCISION MASS NECK Right 07/28/2017   Procedure: exploration of right neck control of hemorrhage;  Surgeon: Osborn CohoShoemaker, David, MD;  [facility];  Service: ENT;  Laterality: Right;  . INCISION AND DRAINAGE ABSCESS Right 07/27/2017   Procedure: INCISION AND DRAINAGE ABSCESS;  Surgeon: Osborn CohoShoemaker, David, MD;  [facility];  Service: ENT;  Laterality: Right;     Social History   reports that she has been smoking cigarettes. She has been smoking about 1.00 pack per  day. She has never used smokeless tobacco. She reports current alcohol use. She reports that she does not use drugs.   Family History   Her family history includes GER disease in her mother.   Allergies Allergies  Allergen Reactions  . Vicodin [Hydrocodone-Acetaminophen] Itching and Nausea Only     Home Medications  Prior to Admission medications   Medication Sig Start Date End Date Taking? Authorizing Provider  amphetamine-dextroamphetamine (ADDERALL) 10 MG tablet Take 20 mg by mouth 2 (two) times daily.  12/21/14  Yes [provider]  carbamazepine (TEGRETOL) 200 MG tablet 800mg  PO QD X 1D, then 600mg  PO QD X 1D, then 400mg  QD X 1D, then 200mg  PO QD X 2D Patient taking differently: Take 200-800 mg by mouth daily at 12 noon. 800mg  PO QD X 1D, then 600mg  PO QD X 1D, then 400mg  QD X 1D, then 200mg  PO QD X 2D 06/11/18  Yes Ward, Kristen N, DO  fluticasone (FLONASE) 50 MCG/ACT nasal spray Place 2 sprays into both nostrils daily as needed for allergies.    Yes [provider]  folic acid (FOLVITE) 1 MG tablet Take 1 tablet (1 mg total) by mouth daily. 06/11/18  Yes Ward, Kristen N, DO  Hypromellose (ARTIFICIAL TEARS OP) Place 1 drop into both eyes as needed (dry eye).    Yes [provider]  insulin  glargine (LANTUS) 100 UNIT/ML injection Inject 0.18 mLs (18 Units total) into the skin daily. Patient taking differently: Inject 24 Units into the skin at bedtime.  08/02/17  Yes Regalado, Belkys A, MD  magnesium oxide (MAG-OX) 400 (241.3 Mg) MG tablet Take 1 tablet (400 mg total) by mouth 2 (two) times daily. 08/02/17  Yes Regalado, Belkys A, MD  mupirocin ointment (BACTROBAN) 2 % Place 1 application into the nose 2 (two) times daily. For skin irritation   Yes [provider]  ondansetron (ZOFRAN ODT) 4 MG disintegrating tablet Take 1 tablet (4 mg total) by mouth every 6 (six) hours as needed for nausea or vomiting. 06/11/18  Yes Ward, Layla Maw, DO  PROAIR HFA 108 (90 Base) MCG/ACT inhaler Inhale 2 puffs into the lungs every 4 (four) hours as needed for wheezing. 07/05/17  Yes [provider]  propranolol (INDERAL) 10 MG tablet Take 10 mg by mouth 2 (two) times daily.   Yes [provider]  sertraline (ZOLOFT) 100 MG tablet Take 100 mg by mouth daily.   Yes [provider]  thiamine (VITAMIN B-1) 100 MG tablet Take 1 tablet (100 mg total) by mouth daily. 06/11/18  Yes Ward, Layla Maw, DO  ferrous sulfate 325 (65 FE) MG tablet Take 1 tablet (325 mg total) by mouth 2 (two) times daily with a meal. Patient not taking: Reported on 07/17/2018 08/01/17   Alba Cory, MD     Critical care time:        Versie Starks, DO 07/18/2018, 7:04 AM Pager: [phone]

## 2018-07-19 ENCOUNTER — Encounter (HOSPITAL_COMMUNITY): Payer: Self-pay

## 2018-07-19 ENCOUNTER — Inpatient Hospital Stay (HOSPITAL_COMMUNITY): Payer: BC Managed Care – PPO

## 2018-07-19 ENCOUNTER — Other Ambulatory Visit: Payer: Self-pay

## 2018-07-19 LAB — CBC
HCT: 36.4 % (ref 36.0–46.0)
Hemoglobin: 12.2 g/dL (ref 12.0–15.0)
MCH: 29.3 pg (ref 26.0–34.0)
MCHC: 33.5 g/dL (ref 30.0–36.0)
MCV: 87.3 fL (ref 80.0–100.0)
PLATELETS: 120 10*3/uL — AB (ref 150–400)
RBC: 4.17 MIL/uL (ref 3.87–5.11)
RDW: 13.8 % (ref 11.5–15.5)
WBC: 6.5 10*3/uL (ref 4.0–10.5)
nRBC: 0 % (ref 0.0–0.2)

## 2018-07-19 LAB — MAGNESIUM: Magnesium: 2 mg/dL (ref 1.7–2.4)

## 2018-07-19 LAB — COMPREHENSIVE METABOLIC PANEL
ALT: 32 U/L (ref 0–44)
AST: 135 U/L — ABNORMAL HIGH (ref 15–41)
Albumin: 3.1 g/dL — ABNORMAL LOW (ref 3.5–5.0)
Alkaline Phosphatase: 96 U/L (ref 38–126)
Anion gap: 11 (ref 5–15)
BUN: <5 mg/dL — ABNORMAL LOW (ref 6–20)
CHLORIDE: 101 mmol/L (ref 98–111)
CO2: 21 mmol/L — ABNORMAL LOW (ref 22–32)
Calcium: 7.5 mg/dL — ABNORMAL LOW (ref 8.9–10.3)
Creatinine, Ser: 0.47 mg/dL (ref 0.44–1.00)
GFR calc Af Amer: >60 mL/min (ref >60)
GFR calc non Af Amer: >60 mL/min (ref >60)
Glucose, Bld: 103 mg/dL — ABNORMAL HIGH (ref 70–99)
POTASSIUM: 3.8 mmol/L (ref 3.5–5.1)
Sodium: 133 mmol/L — ABNORMAL LOW (ref 135–145)
Total Bilirubin: 2.8 mg/dL — ABNORMAL HIGH (ref 0.3–1.2)
Total Protein: 7.4 g/dL (ref 6.5–8.1)

## 2018-07-19 LAB — GLUCOSE, CAPILLARY
GLUCOSE-CAPILLARY: 119 mg/dL — AB (ref 70–99)
Glucose-Capillary: 107 mg/dL — ABNORMAL HIGH (ref 70–99)
Glucose-Capillary: 94 mg/dL (ref 70–99)
Glucose-Capillary: 101 mg/dL — ABNORMAL HIGH (ref 70–99)
Glucose-Capillary: 99 mg/dL (ref 70–99)
Glucose-Capillary: 102 mg/dL — ABNORMAL HIGH (ref 70–99)

## 2018-07-19 LAB — PROTIME-INR
INR: 1.15
Prothrombin Time: 14.6 seconds (ref 11.4–15.2)

## 2018-07-19 MED ORDER — INSULIN ASPART 100 UNIT/ML ~~LOC~~ SOLN
0.0000 [IU] | Freq: Three times a day (TID) | SUBCUTANEOUS | Status: DC
  Administered 2018-07-23: 1 [IU] via SUBCUTANEOUS
  Administered 2018-07-25: 5 [IU] via SUBCUTANEOUS
  Administered 2018-07-26: 2 [IU] via SUBCUTANEOUS

## 2018-07-19 MED ORDER — POLYETHYLENE GLYCOL 3350 17 G PO PACK
17.0000 g | PACK | Freq: Every day | ORAL | Status: DC
  Filled 2018-07-19 (×5): qty 1

## 2018-07-19 MED ORDER — LEVETIRACETAM 500 MG PO TABS
500.0000 mg | ORAL_TABLET | Freq: Two times a day (BID) | ORAL | Status: DC
  Administered 2018-07-19 – 2018-07-20 (×3): 500 mg via ORAL
  Filled 2018-07-19 (×3): qty 1

## 2018-07-19 MED ORDER — ADULT MULTIVITAMIN W/MINERALS CH
1.0000 | ORAL_TABLET | Freq: Every day | ORAL | Status: DC
  Administered 2018-07-19 – 2018-07-26 (×7): 1 via ORAL
  Filled 2018-07-19 (×8): qty 1

## 2018-07-19 MED ORDER — MORPHINE SULFATE (PF) 2 MG/ML IV SOLN
2.0000 mg | INTRAVENOUS | Status: DC | PRN
  Administered 2018-07-19 (×2): 2 mg via INTRAVENOUS
  Filled 2018-07-19 (×3): qty 1

## 2018-07-19 MED ORDER — MAGIC MOUTHWASH W/LIDOCAINE
15.0000 mL | Freq: Four times a day (QID) | ORAL | Status: DC
  Administered 2018-07-19 – 2018-07-26 (×25): 15 mL via ORAL
  Filled 2018-07-19 (×24): qty 15

## 2018-07-19 MED ORDER — FOLIC ACID 1 MG PO TABS
1.0000 mg | ORAL_TABLET | Freq: Every day | ORAL | Status: DC
  Administered 2018-07-19 – 2018-07-26 (×7): 1 mg via ORAL
  Filled 2018-07-19 (×8): qty 1

## 2018-07-19 MED ORDER — PNEUMOCOCCAL VAC POLYVALENT 25 MCG/0.5ML IJ INJ: 0.5000 mL | INJECTION | INTRAMUSCULAR | Status: DC | PRN

## 2018-07-19 MED ORDER — LORAZEPAM 2 MG/ML IJ SOLN
2.0000 mg | INTRAMUSCULAR | Status: DC | PRN
  Administered 2018-07-19 – 2018-07-20 (×4): 2 mg via INTRAVENOUS
  Administered 2018-07-20: 3 mg via INTRAVENOUS
  Administered 2018-07-20 – 2018-07-23 (×13): 2 mg via INTRAVENOUS
  Filled 2018-07-19 (×11): qty 1
  Filled 2018-07-19: qty 2
  Filled 2018-07-19 (×4): qty 1
  Filled 2018-07-19: qty 2
  Filled 2018-07-19 (×3): qty 1

## 2018-07-19 MED ORDER — HYDROCODONE-ACETAMINOPHEN 5-325 MG PO TABS
1.0000 | ORAL_TABLET | Freq: Four times a day (QID) | ORAL | Status: DC | PRN
  Filled 2018-07-19 (×2): qty 1

## 2018-07-19 MED ORDER — VITAMIN B-1 100 MG PO TABS
100.0000 mg | ORAL_TABLET | Freq: Every day | ORAL | Status: DC
  Administered 2018-07-21 – 2018-07-26 (×6): 100 mg via ORAL
  Filled 2018-07-19 (×7): qty 1

## 2018-07-19 MED ORDER — DOCUSATE SODIUM 100 MG PO CAPS
100.0000 mg | ORAL_CAPSULE | Freq: Two times a day (BID) | ORAL | Status: DC
  Administered 2018-07-23 – 2018-07-26 (×6): 100 mg via ORAL
  Filled 2018-07-19 (×11): qty 1

## 2018-07-19 MED ORDER — THIAMINE HCL 100 MG/ML IJ SOLN
100.0000 mg | Freq: Every day | INTRAMUSCULAR | Status: DC
  Administered 2018-07-19: 100 mg via INTRAVENOUS
  Filled 2018-07-19: qty 2

## 2018-07-19 NOTE — Progress Notes (Signed)
Saw patient this morning.  She is in no acute distress.  Objective: Splint in place, wiggling toes, distal motor and sensory function appears to be intact this point limits exam.  Assessment: Trimalleolar right ankle fracture  Plan: Plan for ORIF.  We talked to the patient about the risk benefits again including significant risk of infection nonunion and malunion.  She understands all of these things.  We will work on surgical scheduling and will attempt to have the surgery tomorrow.  Please make patient n.p.o. in light of this.  We will update the plan if she is not able to have surgery tomorrow and may have it later this week with myself or another surgeon.

## 2018-07-19 NOTE — Progress Notes (Signed)
[facility] Progress Note  Patient: Angela Villegas ZOX:[id number]   PCP: Shirlean MylarWebb, Carol, MD DOB: 1991/03/25   DOA: 07/17/2018   DOS: 07/19/2018   Date of Service: the patient was seen and examined on 07/19/2018  Brief hospital course: Pt. with PMH of cirrhosis, alcohol abuse and withdrawal, ADD, and T2DM ; admitted on 07/17/2018, presented with complaint of seizure, was found to have alcohol withdrawal induced seizure along with right ankle fracture and tongue laceration.  She typically drinks 1/2 liter of vodka a day, last drink was 1 07/16/2018.  2 seizures at home, 1 seizure here in the ER.  Started on telemetry admitted to ICU,.  Transferred to floor on 07/19/2018. Currently further plan is current measures, surgical clearance for ORIF tomorrow.  Subjective: Denies any new complaint.  Has pain in her tongue where she has laceration.  No nausea no vomiting.  Difficulty swallowing secondary to pain.  Denies any chest pain or shortness of breath.  No nausea no vomiting.  Passing gas.  No bowel movement.  Denies any hallucination but does have anxiety.  Later in the afternoon while walking to the bathroom on her own tripped and fell on her buttocks.  No head injury.  No dizziness no lightheadedness.  No passing out.  Reports that her right ankle hurts more.  Telemetry: Sinus tachycardia.  Assessment and Plan: 1.  Alcohol abuse. Alcohol withdrawal seizures. Admitted to the ICU started on Precedex. Scheduled Ativan along with Ativan as needed for CIWA protocol. Seizure prophylaxis. Also started on Keppra 500 mg twice daily secondary to repeated seizures in the ER. Continue Keppra 5 mg twice daily for now. Continue folic acid and thiamine. Change to stepdown CIWA protocol. Continue with IV hydration.  Negative for any acute abnormality  2.  Right distal lower leg fibular fracture. Orthopedic were consulted. Patient is scheduled for procedure tomorrow. Still 8-10, anticipating the patient may be  able to go for the surgery without any complication tomorrow. We will monitor overnight for any acute abnormality.  3.  Alcohol induced cirrhosis. Patient is on Inderal at home which is currently continued at a reduced dose of 10 mg daily.  In the past was on Lasix and Aldactone although currently not on her home medication list.  4.  Tongue laceration. PCCM discussed with ENT, recommend oral and topical pain medication. SLP evaluation currently recommending full liquid diet secondary to difficulty swallowing secondary to pain.  5. Type  Diabetes Mellitus, uncontroled without any complication last hemoglobin A1c was 11 in 2019, Check hemoglobin A1c. hold her oral hypoglycemic agents. On insulin sliding scale sensitive.  7. Obesity Body mass index is 30.7 kg/m.  Dietary consultation. Currently unable to eat anything due to tongue laceration.  8.  Fall. Patient had a fall at the hospital while getting out of the bed on her own without any assistance. Patient was informed and educated to call before getting out of the bed. No focal deficit on examination. We will get x-ray of the right leg.  9.  Urinary retention. Present on admission, We will continue Foley catheter which was inserted in ICU until we get postop mobility.  Diet: full liquid diet DVT Prophylaxis: subcutaneous Heparin  Advance goals of care discussion: full code  Family Communication: family was present at bedside, at the time of interview. The pt provided permission to discuss medical plan with the family. Opportunity was given to ask question and all questions were answered satisfactorily.   Disposition:  Discharge to be determined.  Consultants: PCCM, orthopedics  Procedures: none  Scheduled Meds: . docusate sodium  100 mg Oral BID  . enoxaparin (LOVENOX) injection  40 mg Subcutaneous Q24H  . fluocinonide cream   Topical BID  . folic acid  1 mg Oral Daily  . levETIRAcetam  500 mg Oral BID  . magic  mouthwash w/lidocaine  15 mL Oral QID  . mouth rinse  15 mL Mouth Rinse BID  . multivitamin with minerals  1 tablet Oral Daily  . polyethylene glycol  17 g Oral Daily  . propranolol  10 mg Oral Daily  . sertraline  100 mg Oral Daily  . [START ON 07/20/2018] thiamine  100 mg Oral Daily   Continuous Infusions: . sodium chloride 75 mL/hr at 07/19/18 1400   PRN Meds: HYDROcodone-acetaminophen, LORazepam, magic mouthwash w/lidocaine, morphine injection, ondansetron (ZOFRAN) IV, phenol, pneumococcal 23 valent vaccine Antibiotics: Anti-infectives (From admission, onward)   None       Objective: Physical Exam: Vitals:   07/19/18 1200 07/19/18 1400 07/19/18 1601 07/19/18 1700  BP:   (!) 146/96   Pulse: 90 88 93 93  Resp:  (!) 21 (!) 21 (!) 23  Temp:      TempSrc:      SpO2: 98% 98% 97% 98%  Weight:      Height:        Intake/Output Summary (Last 24 hours) at 07/19/2018 1754 Last data filed at 07/19/2018 1700 Gross per 24 hour  Intake 2430.57 ml  Output 1000 ml  Net 1430.57 ml   Filed Weights   07/17/18 0635 07/18/18 0352 07/19/18 0441  Weight: 70.8 kg 73.7 kg 73.7 kg   General: Alert, Awake and Oriented to Time, Place and Person. Appear in mild distress, affect anxious  Eyes: PERRL, Conjunctiva normal ENT: Oral Mucosa with healing laceration, clear moist. Neck: no JVD, no Abnormal Mass Or lumps Cardiovascular: S1 and S2 Present, no Murmur, Peripheral Pulses Present Respiratory: normal respiratory effort, Bilateral Air entry equal and Decreased, no use of accessory muscle, Clear to Auscultation, no Crackles, no wheezes Abdomen: Bowel Sound present, Soft and no tenderness, no hernia Skin: no redness, no Rash, no induration Extremities: no Pedal edema, no calf tenderness, right leg is wrapped in splint. Neurologic: Grossly no focal neuro deficit. Bilaterally Equal motor strength Significant anxiety  Data Reviewed: CBC: Recent Labs  Lab 07/17/18 0651 07/18/18 0657  07/19/18 0233  WBC 9.5 6.1 6.5  NEUTROABS 8.2*  --   --   HGB 14.4 11.8* 12.2  HCT 42.9 35.8* 36.4  MCV 85.5 87.7 87.3  PLT 183 113* 120*   Basic Metabolic Panel: Recent Labs  Lab 07/17/18 0651 07/17/18 1539 07/18/18 0351 07/19/18 0233  NA 131*  --  137 133*  K 4.2  --  3.9 3.8  CL 91*  --  105 101  CO2 23  --  19* 21*  GLUCOSE 198*  --  94 103*  BUN 6  --  11 <5*  CREATININE 0.60  --  0.56 0.47  CALCIUM 8.6*  --  7.7* 7.5*  MG  --  1.4*  --  2.0  PHOS  --  4.4  --   --     Liver Function Tests: Recent Labs  Lab 07/17/18 0651 07/18/18 0351 07/19/18 0233  AST 202* 142* 135*  ALT 34 27 32  ALKPHOS 114 89 96  BILITOT 2.9* 2.6* 2.8*  PROT 8.8* 7.2 7.4  ALBUMIN 3.7 3.1* 3.1*   No results for input(s):  LIPASE, AMYLASE in the last 168 hours. No results for input(s): AMMONIA in the last 168 hours. Coagulation Profile: Recent Labs  Lab 07/19/18 0233  INR 1.15   Cardiac Enzymes: No results for input(s): CKTOTAL, CKMB, CKMBINDEX, TROPONINI in the last 168 hours. BNP (last 3 results) No results for input(s): PROBNP in the last 8760 hours. CBG: Recent Labs  Lab 07/19/18 0005 07/19/18 0317 07/19/18 0737 07/19/18 1209 07/19/18 1719  GLUCAP 119* 107* 94 101* 99   Studies: No results found.   Time spent: 35 minutes  Author: Lynden Oxford, MD [facility] Pager: [phone] 07/19/2018 5:54 PM  Between 7PM-7AM, please contact night-coverage at [email or web address], password [facility]

## 2018-07-19 NOTE — Progress Notes (Signed)
Patient fell during transfer to the bedside commode. Patient states she was sleeping and she woke up and had to pee really bad. Family at bedside and was sleeping and patient did not want to wake him. Received call from cardiac tele that the patient's leads were off and while in route to see the patient the charge nurse stated that the patient called out using the call bell system and stated that she fell. Upon entering the room the family member was already placing the patient back into the bed. Paged Dr. Allena Katz and he came to access patient. Informed Director of the unit and she made rounds. Heart rate is 88, Respiratory rate is 16, pulse ox is 98% on room air and blood pressure is 150/90. States pain in the broken right ankle and Dr. Allena Katz stated that he would order more pain management. Patient is alert and oriented X4 but appears sleepy but easy to arouse. Instructed family members and patient to not transfer out of bed without help and the verbalized understanding. Placed patient's bed in lowest position, call bell within reach, side rails up X2, offered bathroom trip, offered po intake and placed patient's bed on bed alarm. Will continue to monitor.

## 2018-07-19 NOTE — Plan of Care (Signed)

## 2018-07-19 NOTE — Evaluation (Signed)
Clinical/Bedside Swallow Evaluation Patient Details  Name: Shammara Granderson MRN: [id number] Date of Birth: 1991/03/07  Today's Date: 07/19/2018 Time: SLP Start Time (ACUTE ONLY): 1021 SLP Stop Time (ACUTE ONLY): 1039 SLP Time Calculation (min) (ACUTE ONLY): 18 min  Past Medical History:  Past Medical History:  Diagnosis Date  . ADD (attention deficit disorder)   . Alcohol abuse   . Asthma   . Diabetes mellitus (HCC)   . Diabetes mellitus without complication (HCC)   . Hypertension   . Liver disease, unspecified    Past Surgical History:  Past Surgical History:  Procedure Laterality Date  . EXCISION MASS NECK Right 07/28/2017   Procedure: exploration of right neck control of hemorrhage;  Surgeon: Osborn Coho, MD;  Location: [facility] OR;  Service: ENT;  Laterality: Right;  . INCISION AND DRAINAGE ABSCESS Right 07/27/2017   Procedure: INCISION AND DRAINAGE ABSCESS;  Surgeon: Osborn Coho, MD;  Location: [facility] OR;  Service: ENT;  Laterality: Right;   HPI:  Pt is a 28yo female with cirrhosis, alcohol abuse and withdrawal, eczema, ADD, and TIIDM presenting with tachycardia and seizurex2 secondary to alcohol withdrawal. During the second seizure she bit her tongue, fell and experienced an oblique fx of the right fibula   Assessment / Plan / Recommendation Clinical Impression  Pt has a visible lesion on the L side of her tongue and associated edema secondary to biting her tongue. She has no overt signs of aspiration, although she does have prolonged oral transit. She describes needing a liquid wash to clear purees from her oral cavity, although her mouth is clear after use of liquid wash. She also describes difficulty discerning between true residue versus feeling her tongue. Pt declined more solid textures, saying that she had tried a cracker earlier today and that it was very difficulty to clear from her mouth. Full liquid versis Dys 1 diet was reviewed with pt and at this time she opts for a  full liquid diet (with thin liquids). Anticipate good prognosis for advancement as her tongue heals, as this appears to be secondary to that injury and less so related to any underlying dysphagia. SLP Visit Diagnosis: Dysphagia, oral phase (R13.11)    Aspiration Risk  Mild aspiration risk    Diet Recommendation Thin liquid;Other (Comment)(full liquid)   Liquid Administration via: Cup;Straw Medication Administration: (pt taking them in liquids per RN) Supervision: Patient able to self feed;Intermittent supervision to cue for compensatory strategies Compensations: Slow rate;Small sips/bites;Follow solids with liquid Postural Changes: Seated upright at 90 degrees    Other  Recommendations Oral Care Recommendations: Oral care BID   Follow up Recommendations None      Frequency and Duration min 2x/week  2 weeks       Prognosis Prognosis for Safe Diet Advancement: Good      Swallow Study   General HPI: Pt is a 28yo female with cirrhosis, alcohol abuse and withdrawal, eczema, ADD, and TIIDM presenting with tachycardia and seizurex2 secondary to alcohol withdrawal. During the second seizure she bit her tongue, fell and experienced an oblique fx of the right fibula Type of Study: Bedside Swallow Evaluation Previous Swallow Assessment: none in chart Diet Prior to this Study: NPO Temperature Spikes Noted: No Respiratory Status: Room air History of Recent Intubation: No Behavior/Cognition: Alert;Cooperative Oral Cavity Assessment: Other (comment)(lesion on L side of tongue from biting; associated edema) Oral Care Completed by SLP: No Oral Cavity - Dentition: Adequate natural dentition Vision: Functional for self-feeding Self-Feeding Abilities: Able to feed self  Patient Positioning: Upright in bed Baseline Vocal Quality: Other (comment)(glottal fry) Volitional Swallow: Able to elicit    Oral/Motor/Sensory Function Overall Oral Motor/Sensory Function: Other (comment)(tongue is  swollenn, possibly reduced ROM but mild)   Ice Chips Ice chips: Not tested   Thin Liquid Thin Liquid: Within functional limits Presentation: Self Fed;Straw    Nectar Thick Nectar Thick Liquid: Not tested   Honey Thick Honey Thick Liquid: Not tested   Puree Puree: Impaired Presentation: Self Fed Oral Phase Functional Implications: Other (comment)(pt reports needing liquid wash to clear residue)   Solid     Solid: Not tested(pt said she tried a cracker earlier and it didn't go well)      Maxcine Hamaiewonsky, Monica Codd 07/19/2018,10:51 AM   Maxcine HamLaura Paiewonsky, M.A. CCC-SLP [facility] Pager [phone] Office [phone]

## 2018-07-20 ENCOUNTER — Encounter (HOSPITAL_COMMUNITY): Payer: Self-pay

## 2018-07-20 ENCOUNTER — Inpatient Hospital Stay (HOSPITAL_COMMUNITY): Payer: BC Managed Care – PPO | Admitting: Certified Registered Nurse Anesthetist

## 2018-07-20 ENCOUNTER — Encounter (HOSPITAL_COMMUNITY)
Admission: EM | Disposition: A | Discharge status: Home Health | Payer: Self-pay | Source: Home / Self Care | Attending: Internal Medicine

## 2018-07-20 ENCOUNTER — Inpatient Hospital Stay (HOSPITAL_COMMUNITY): Payer: BC Managed Care – PPO

## 2018-07-20 LAB — COMPREHENSIVE METABOLIC PANEL
ALK PHOS: 102 U/L (ref 38–126)
ALT: 32 U/L (ref 0–44)
AST: 124 U/L — ABNORMAL HIGH (ref 15–41)
Albumin: 3 g/dL — ABNORMAL LOW (ref 3.5–5.0)
Anion gap: 10 (ref 5–15)
CALCIUM: 7.5 mg/dL — AB (ref 8.9–10.3)
CO2: 21 mmol/L — ABNORMAL LOW (ref 22–32)
Chloride: 100 mmol/L (ref 98–111)
Creatinine, Ser: 0.49 mg/dL (ref 0.44–1.00)
GFR calc Af Amer: >60 mL/min (ref >60)
GFR calc non Af Amer: >60 mL/min (ref >60)
Glucose, Bld: 104 mg/dL — ABNORMAL HIGH (ref 70–99)
Potassium: 3.2 mmol/L — ABNORMAL LOW (ref 3.5–5.1)
Sodium: 131 mmol/L — ABNORMAL LOW (ref 135–145)
Total Bilirubin: 2.8 mg/dL — ABNORMAL HIGH (ref 0.3–1.2)
Total Protein: 7.4 g/dL (ref 6.5–8.1)

## 2018-07-20 LAB — CBC WITH DIFFERENTIAL/PLATELET
Abs Immature Granulocytes: 0.02 10*3/uL (ref 0.00–0.07)
BASOS PCT: 0 %
Basophils Absolute: 0 10*3/uL (ref 0.0–0.1)
EOS ABS: 0.1 10*3/uL (ref 0.0–0.5)
Eosinophils Relative: 2 %
HCT: 35.1 % — ABNORMAL LOW (ref 36.0–46.0)
Hemoglobin: 11.4 g/dL — ABNORMAL LOW (ref 12.0–15.0)
Immature Granulocytes: 0 %
Lymphocytes Relative: 14 %
Lymphs Abs: 1 10*3/uL (ref 0.7–4.0)
MCH: 27.9 pg (ref 26.0–34.0)
MCHC: 32.5 g/dL (ref 30.0–36.0)
MCV: 86 fL (ref 80.0–100.0)
Monocytes Absolute: 0.6 10*3/uL (ref 0.1–1.0)
Monocytes Relative: 9 %
Neutro Abs: 5 10*3/uL (ref 1.7–7.7)
Neutrophils Relative %: 75 %
Platelets: 136 10*3/uL — ABNORMAL LOW (ref 150–400)
RBC: 4.08 MIL/uL (ref 3.87–5.11)
RDW: 13.2 % (ref 11.5–15.5)
WBC: 6.8 10*3/uL (ref 4.0–10.5)
nRBC: 0 % (ref 0.0–0.2)

## 2018-07-20 LAB — GLUCOSE, CAPILLARY
GLUCOSE-CAPILLARY: 122 mg/dL — AB (ref 70–99)
Glucose-Capillary: 113 mg/dL — ABNORMAL HIGH (ref 70–99)
Glucose-Capillary: 99 mg/dL (ref 70–99)
Glucose-Capillary: 93 mg/dL (ref 70–99)
Glucose-Capillary: 106 mg/dL — ABNORMAL HIGH (ref 70–99)
Glucose-Capillary: 110 mg/dL — ABNORMAL HIGH (ref 70–99)
Glucose-Capillary: 113 mg/dL — ABNORMAL HIGH (ref 70–99)

## 2018-07-20 LAB — RAPID URINE DRUG SCREEN, HOSP PERFORMED
Amphetamines: NOT DETECTED
Barbiturates: NOT DETECTED
Benzodiazepines: POSITIVE — AB
Cocaine: NOT DETECTED
Opiates: POSITIVE — AB
Tetrahydrocannabinol: NOT DETECTED

## 2018-07-20 LAB — URINALYSIS, ROUTINE W REFLEX MICROSCOPIC
Bilirubin Urine: NEGATIVE
GLUCOSE, UA: NEGATIVE mg/dL
Ketones, ur: NEGATIVE mg/dL
Leukocytes, UA: NEGATIVE
Nitrite: NEGATIVE
Protein, ur: 30 mg/dL — AB
Specific Gravity, Urine: 1.006 (ref 1.005–1.030)
pH: 7 (ref 5.0–8.0)

## 2018-07-20 LAB — HEMOGLOBIN A1C
HEMOGLOBIN A1C: 6.4 % — AB (ref 4.8–5.6)
Mean Plasma Glucose: 136.98 mg/dL

## 2018-07-20 LAB — PROTIME-INR
INR: 1.16
Prothrombin Time: 14.7 seconds (ref 11.4–15.2)

## 2018-07-20 LAB — MAGNESIUM: Magnesium: 1.6 mg/dL — ABNORMAL LOW (ref 1.7–2.4)

## 2018-07-20 SURGERY — OPEN REDUCTION INTERNAL FIXATION (ORIF) ANKLE FRACTURE
Anesthesia: Choice | Laterality: Right

## 2018-07-20 MED ORDER — PROPRANOLOL HCL 40 MG PO TABS
20.0000 mg | ORAL_TABLET | Freq: Every day | ORAL | Status: DC
  Administered 2018-07-20 – 2018-07-26 (×7): 20 mg via ORAL
  Filled 2018-07-20 (×8): qty 1

## 2018-07-20 MED ORDER — LORAZEPAM 2 MG/ML IJ SOLN
2.0000 mg | Freq: Once | INTRAMUSCULAR | Status: DC
  Filled 2018-07-20: qty 1

## 2018-07-20 MED ORDER — CHLORHEXIDINE GLUCONATE 4 % EX LIQD
60.0000 mL | Freq: Once | CUTANEOUS | Status: AC
  Administered 2018-07-20: 4 via TOPICAL
  Filled 2018-07-20: qty 60

## 2018-07-20 MED ORDER — ONDANSETRON HCL 4 MG/2ML IJ SOLN
INTRAMUSCULAR | Status: AC
  Filled 2018-07-20: qty 2

## 2018-07-20 MED ORDER — LEVETIRACETAM 250 MG PO TABS
250.0000 mg | ORAL_TABLET | Freq: Two times a day (BID) | ORAL | Status: AC
  Administered 2018-07-21 (×2): 250 mg via ORAL
  Filled 2018-07-20 (×2): qty 1

## 2018-07-20 MED ORDER — MORPHINE SULFATE (PF) 2 MG/ML IV SOLN
2.0000 mg | INTRAVENOUS | Status: DC | PRN
  Administered 2018-07-20 – 2018-07-26 (×24): 2 mg via INTRAVENOUS
  Filled 2018-07-20 (×26): qty 1

## 2018-07-20 MED ORDER — DEXAMETHASONE SODIUM PHOSPHATE 10 MG/ML IJ SOLN
INTRAMUSCULAR | Status: AC
  Filled 2018-07-20: qty 1

## 2018-07-20 MED ORDER — LIDOCAINE 2% (20 MG/ML) 5 ML SYRINGE
INTRAMUSCULAR | Status: AC
  Filled 2018-07-20: qty 5

## 2018-07-20 MED ORDER — POTASSIUM CHLORIDE CRYS ER 20 MEQ PO TBCR
40.0000 meq | EXTENDED_RELEASE_TABLET | Freq: Once | ORAL | Status: AC
  Administered 2018-07-20: 40 meq via ORAL
  Filled 2018-07-20: qty 2

## 2018-07-20 MED ORDER — FENTANYL CITRATE (PF) 250 MCG/5ML IJ SOLN
INTRAMUSCULAR | Status: AC
  Filled 2018-07-20: qty 5

## 2018-07-20 NOTE — Plan of Care (Signed)

## 2018-07-20 NOTE — Care Management Note (Signed)
Case Management Note  Patient Details  Name: Angela Villegas MRN: [id number] Date of Birth: [birth year]  Subjective/Objective:                 ETOH withdrawal seizures and ankle fracture.   Action/Plan:  28 year old Patient from home w spouse. Independent prior to admission. History significant for ETOH abuse. Plan for ORIF of ankle delayed 2/2 fever.   Expected Discharge Date:                  Expected Discharge Plan:  Home/Self Care  In-House Referral:  Clinical Social Work  Discharge planning Services  CM Consult  Post Acute Care Choice:    Choice offered to:     DME Arranged:    DME Agency:     HH Arranged:    HH Agency:     Status of Service:  In process, will continue to follow  If discussed at Long Length of Stay Meetings, dates discussed:    Additional Comments:  Lawerance Sabal, RN 07/20/2018, 12:18 PM

## 2018-07-20 NOTE — Progress Notes (Addendum)
[facility] Progress Note  Patient: Angela Villegas WOE:[id number]   PCP: Shirlean Mylar, MD DOB: 08-Oct-1990   DOA: 07/17/2018   DOS: 07/20/2018   Date of Service: the patient was seen and examined on 07/20/2018  Brief hospital course: Pt. with PMH of cirrhosis, alcohol abuse and withdrawal, ADD, and T2DM ; admitted on 07/17/2018, presented with complaint of seizure, was found to have alcohol withdrawal induced seizure along with right ankle fracture and tongue laceration.  She typically drinks 1/2 liter of vodka a day, last drink was 1 07/16/2018.  2 seizures at home, 1 seizure here in the ER.  Started on telemetry admitted to ICU,.  Transferred to [facility] on 07/19/2018. -Plan was for ORIF 1/8 however she was febrile to 101.3 this morning  Subjective:  -No new complaints, found to have a fever of 101.3 this a.m. -Denies any worsening cough congestion, no abdominal pain nausea or vomiting  Assessment and Plan:  Alcohol withdrawal seizures  -Was admitted to the ICU on Precedex -Currently stabilized improved, she was started on 500 mg twice daily Keppra due to multiple seizures, doubt she needs long-term antiepileptic therapy since etiology of seizures is clearly acute alcohol withdrawal, d/w with Neurology, doesn't need long term AEDs and this can be stopped -will cut down keppra dose and stop after tomorrow's dose -Currently on Ativan as needed, thiamine, CIWA protocol  -No evidence of active withdrawal symptoms at this time  -No driving/operating machinery etc for atleast 68months  Trimalleolar right ankle fracture -Plan for ORIF, today surgery postponed due to fever  Fever -Etiology not clear at this time -Check blood cultures, UA urine cultures, chest x-ray -Monitor clinically   Alcohol induced cirrhosis. -Resume Inderal at home dose of 20 mg daily  -No longer on diuretics, will consider resuming this soon   Tongue laceration. PCCM discussed with ENT, recommend oral and topical pain  medication. SLP evaluation currently recommending full liquid diet secondary to difficulty swallowing secondary to pain.  Type 2 Diabetes Mellitus, uncontroled without any complication Hemoglobin A1c was 6.4 , compared to 11 in 2019, -Home medications held, continue sliding scale insulin  Obesity Body mass index is 30.7 kg/m.  Dietary consultation. Currently unable to eat anything due to tongue laceration.  Urinary retention. -resolved, Foley catheter discontinued  Diet: full liquid diet DVT Prophylaxis: subcutaneous Heparin  Advance goals of care discussion: full code  Family Communication: Spouse at bedside  Disposition: Discharge to be determined.  Consultants: PCCM, orthopedics  Procedures: none  Scheduled Meds: . docusate sodium  100 mg Oral BID  . enoxaparin (LOVENOX) injection  40 mg Subcutaneous Q24H  . fluocinonide cream   Topical BID  . folic acid  1 mg Oral Daily  . insulin aspart  0-9 Units Subcutaneous TID WC  . levETIRAcetam  500 mg Oral BID  . magic mouthwash w/lidocaine  15 mL Oral QID  . mouth rinse  15 mL Mouth Rinse BID  . multivitamin with minerals  1 tablet Oral Daily  . polyethylene glycol  17 g Oral Daily  . propranolol  20 mg Oral Daily  . sertraline  100 mg Oral Daily  . thiamine  100 mg Oral Daily   Continuous Infusions: . sodium chloride 75 mL/hr at 07/20/18 1000   PRN Meds: HYDROcodone-acetaminophen, LORazepam, magic mouthwash w/lidocaine, morphine injection, ondansetron (ZOFRAN) IV, phenol, pneumococcal 23 valent vaccine Antibiotics: Anti-infectives (From admission, onward)   None       Objective: Physical Exam: Vitals:   07/20/18 0800 07/20/18  1751 07/20/18 0900 07/20/18 1000  BP: (!) 143/101 (!) 152/104    Pulse: 96 (!) 107  97  Resp: 17 16 12 15   Temp:  (!) 101.3 F (38.5 C)    TempSrc:  Oral    SpO2: 98% 97%  98%  Weight:      Height:        Intake/Output Summary (Last 24 hours) at 07/20/2018 1251 Last data filed at  07/20/2018 1000 Gross per 24 hour  Intake 1725.67 ml  Output 900 ml  Net 825.67 ml   Filed Weights   07/18/18 0352 07/19/18 0441 07/20/18 0405  Weight: 73.7 kg 73.7 kg 73.7 kg   Gen: Chronically ill-appearing young female, laying in bed, no distress HEENT: PERRLA, Neck supple, no JVD Lungs: Good air movement, decreased breath sounds at both bases CVS: RRR,No Gallops,Rubs or new Murmurs Abd: Soft, mildly distended, nontender, bowel sounds present Extremities: No edema, right ankle with dressing/Ace wrap Skin: no new rashes Neuro: Moves all extremities, no localizing signs, no asterixis  Data Reviewed: CBC: Recent Labs  Lab 07/17/18 0651 07/18/18 0657 07/19/18 0233 07/20/18 0315  WBC 9.5 6.1 6.5 6.8  NEUTROABS 8.2*  --   --  5.0  HGB 14.4 11.8* 12.2 11.4*  HCT 42.9 35.8* 36.4 35.1*  MCV 85.5 87.7 87.3 86.0  PLT 183 113* 120* 136*   Basic Metabolic Panel: Recent Labs  Lab 07/17/18 0651 07/17/18 1539 07/18/18 0351 07/19/18 0233 07/20/18 0315  NA 131*  --  137 133* 131*  K 4.2  --  3.9 3.8 3.2*  CL 91*  --  105 101 100  CO2 23  --  19* 21* 21*  GLUCOSE 198*  --  94 103* 104*  BUN 6  --  11 <5* <5*  CREATININE 0.60  --  0.56 0.47 0.49  CALCIUM 8.6*  --  7.7* 7.5* 7.5*  MG  --  1.4*  --  2.0 1.6*  PHOS  --  4.4  --   --   --     Liver Function Tests: Recent Labs  Lab 07/17/18 0651 07/18/18 0351 07/19/18 0233 07/20/18 0315  AST 202* 142* 135* 124*  ALT 34 27 32 32  ALKPHOS 114 89 96 102  BILITOT 2.9* 2.6* 2.8* 2.8*  PROT 8.8* 7.2 7.4 7.4  ALBUMIN 3.7 3.1* 3.1* 3.0*   No results for input(s): LIPASE, AMYLASE in the last 168 hours. No results for input(s): AMMONIA in the last 168 hours. Coagulation Profile: Recent Labs  Lab 07/19/18 0233 07/20/18 0315  INR 1.15 1.16   Cardiac Enzymes: No results for input(s): CKTOTAL, CKMB, CKMBINDEX, TROPONINI in the last 168 hours. BNP (last 3 results) No results for input(s): PROBNP in the last 8760  hours. CBG: Recent Labs  Lab 07/19/18 1719 07/19/18 1930 07/19/18 2311 07/20/18 0315 07/20/18 0816  GLUCAP 99 113* 102* 99 93   Studies: Dg Chest 2 View  Result Date: 07/20/2018 CLINICAL DATA:  Alcohol withdrawals and seizure. EXAM: CHEST - 2 VIEW COMPARISON:  07/17/2018 FINDINGS: Normal heart size. Lungs clear. No pneumothorax. No pleural effusion. IMPRESSION: No active cardiopulmonary disease. Electronically Signed   By: Jolaine Click M.D.   On: 07/20/2018 10:35   Dg Ankle Complete Right  Result Date: 07/19/2018 CLINICAL DATA:  Fall with ankle pain. Initial fall with ankle fracture 2 days ago, fall again today in the hospital with ankle splinted. Increased pain. EXAM: RIGHT ANKLE - COMPLETE 3+ VIEW COMPARISON:  Ankle radiographs 07/17/2018 FINDINGS:  Displaced fracture from the medial malleolus demonstrates slight increased angulation since prior exam, equivocal mild increase in degree of displacement. Oblique distal fibular fracture with slightly increased displacement. The ankle mortise is preserved. The previous bone fragment adjacent to the posterior malleolus is not as well seen on the current exam. Overlying splint material in place. There is soft tissue edema. IMPRESSION: 1. Displaced medial malleolar fracture with slight increase in angulation and equivocal increase in degree of displacement. 2. Distal fibular fracture slightly increased in displacement from prior. 3. Previous tiny osseous fragment projecting posterior to the posterior malleolus is not as well seen on the current exam. Electronically Signed   By: Narda RutherfordMelanie  Sanford M.D.   On: 07/19/2018 19:04   Dg Tibia/fibula Right Port  Result Date: 07/19/2018 CLINICAL DATA:  Fall with ankle pain. Initial fall with ankle fracture 2 days ago, fall again today in the hospital with ankle splinted. Increased pain. EXAM: PORTABLE RIGHT TIBIA AND FIBULA - 2 VIEW COMPARISON:  Ankle radiographs 07/17/2018. Dedicated ankle exam performed  concurrently. FINDINGS: Splint material in place. The proximal tibia and fibula are intact. Possible remote fracture of the mid fibular shaft. Ankle fractures better assessed on concurrent ankle exam. Mild soft tissue edema. IMPRESSION: Proximal tibia and fibula are intact. Ankle fractures better assessed on concurrent ankle exam. Electronically Signed   By: Narda RutherfordMelanie  Sanford M.D.   On: 07/19/2018 19:00     Time spent: 35 minutes  Author: Zannie CovePreetha Ignazio Kincaid, MD  07/20/2018 12:51 PM  Between 7PM-7AM, please contact night-coverage at [email or web address], password [email or web address]

## 2018-07-20 NOTE — Progress Notes (Signed)
Patient's family member left for the day. In the room with the patient and took patient to the bathroom. Transferred well and tolerated move well with no distress noted. Offered pain medications for verbalized pain in leg. Gave PRN dose of morphine. Tolerated well. CIWA score yielded 2 mg of Ativan to be given. PRN medication given and tolerated well. Offered Po intake and gave diet ginger ale. Offered Pur wick and patient refused. Bed in the lowest position, call bell within reach side rails up X 2, verbalized understanding of need to call for assistance. Will continue to monitor.

## 2018-07-20 NOTE — Progress Notes (Signed)
Patient called out saying she needed more tissues to wipe herself. Upon entering the room she was on the bedside commode and family member standing beside her. Side rails up X 2 still and bed alarm turned off. I asked how the bed alarm got turned off and the patient said the Nurse tech turned it off but the family member stated that he turned it off. When asked why they didn't use the call bell system to call prior to transferring and the patient stated that she couldn't wait for assistance. Helped transfer back to the bed and explained at great length how transferring the patient without assistance puts the patient in grave danger for death or serious injury. Verbalized understanding by both the patient and family member. Bed in the lowest position, call bell within reach side rails up X 2, family at bedside, cardiac leads in place, purwick offered again with refusal, bed alarm in use. Will continue to monitor.

## 2018-07-20 NOTE — Progress Notes (Signed)
SLP Cancellation Note  Patient Details Name: Angela Villegas MRN: [id number] DOB: [birth year]   Cancelled treatment:       Reason Eval/Treat Not Completed: Patient at procedure or test/unavailable  Ferdinand LangoLeah Urijah Arko MA, CCC-SLP   [name] [name] 07/20/2018, 10:14 AM

## 2018-07-20 NOTE — Progress Notes (Signed)
Offered patient purwick to reduce need to leave bed for voiding. Understood how to use it properly. Offered to place on low bed and place floor mats per policy and patient become very upset and stated that she would not be happy with that and refused to be placed on low bed and have mats placed. Explained the need to keep patient safe due to recent falls and patient still refused. Patient is resting in bed call bell within reach side rails up X 2, family at bedside side, bed alarm in use, Cardiac leads in place. Will continue to monitor.

## 2018-07-20 NOTE — Progress Notes (Signed)
Patient's bed alarm went off and upon entering the room the patient was trying to transfer to the bedside commode without any help. Transferred patient safely to the bedside commode. Instructed patient to use the call bell system to call for any help. Verbalized understanding. Transferred patient back to bed with no distress noted. Offered pur wick but patient refused. Patient verbalized pain was well controlled at this point. Patient is alert and oriented X 4. Bed in the lowest position, call bell within reach, side rails up X 2, cell phone in reach , remote in reach, Will contiune to monitor.

## 2018-07-20 NOTE — Progress Notes (Signed)
Bed alarm was going off in the room and upon entering the room noticed that the family member was turning it off on the bed and the patient was on the bedside commode. When asked why they did not use the call bell system to call for help the family member stated that he was used to helping her and the patient preferred him to do it instead of [place]. The patient stated that she didn't want him to call us. When asked why the staff couldn't help patient she stated that she just was used to him helping her. While assessing patient this nurse replaced cardiac leads and noticed that the IV was disconnected from the patient. Family member stated that he had disconnected her from the leads and IV so he could transfer her to the bedside commode. Educated at great length about how unsafe this practice is, about how patient could sustain harm and even death if transferred unsafely and how it is our privilege to help care for the patient. Patient started to cry. She verbalized understanding of need to call for all needs and not rely on family member. Family member also verbalized understanding and this nurse again educated on how touching the equipment in the room could cause harm or death to himself and the patient stating that we receive special training to operate and administer medications and provide patient care. Placed patient safely back in the bed. This nurse and nurse tech [place] assisted with bath and linen change. Placed patient in the lowest position, call bell within reach, side rails up X 2, replaced IV tubing and offered PO intake and PRN medications. Will continue to monitor.

## 2018-07-20 NOTE — Progress Notes (Signed)
Patient's bed alarm went off and nurse tech [place] was able to reach the patient first. Upon entering the room the tech found the patient on the bedside but her injured foot was still caught in the railing. Able to safely remove foot with no further injury. Patient became very upset and started shouting that we were preventing her from going to the bathroom by herself. Reminded patient that helping her transfer was to help prevent new injury and further injury to her foot. Patient refused to use the bed side commode and attempted to try to walk to the bathroom. Instructed that this nurse would assist her to the bathroom room if she wanted to go and she stated that I was keeping her prisoner against her will. Patient stood up on her uninjured leg and started to walk using the sink, bedside table and bedside commode to steady her. Attempted to provide support to patient to maintain safety and patient pushed this nurse away and almost falling forward. Nurse tech [place] was able to catch her and steady her safety back on the bedside commode. She continued to state that we were preventing her "free will to use there bathroom alone" and we were "trying to control her". Explained to patient that we only wanted to keep her safe from injury and harm. Other nurse Blane OharaSavannah Burns RN came in to the room to assist with patient. When patient had completed voiding she attempted to get up without aid. [place] nurse tech tried to assist patient and she pushed her stating that she "would and could use self defense to keep us from touching her." Patient removed all cardiac leads, pulse ox while also trying to place herself in the bed. [facility] paged Dr. Jomarie LongsJoseph. 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 When he asked about her getting medication she stated that we "wasn't giving her anything to try to poison her". He was able to calm her down and she then asked for the Ativan to help her relax. Savannah RN gave PRN dose of ativan. Patient continued to sit on the side of the bed and refused to allow staff to help her move to a safe position. Instructed to the patient that staff could not leave until she was in a safe location. Patient continued to refuse cardiac leads, pulse ox and any staff involvement. Dr. Jomarie LongsJoseph ordered another PRN dose of Ativan but patient refused to take it stating " I know my rights". She was able to identify the date, time, her location and why she was in the hospital. She knew her name and answer questions appropriately. She stated " stop treating me like a crazy person I know what is happening and you cannot make me do anything against my will. " Patient started to cry and staff sat with patient until husband arrived. After husband arrived he helped staff place patient in a safe location in the bed. Discussed with family member the events that happened and how it was unsafe for her to continue to get up without staff assistance. Instructed him that while he may want to help her it is our job and duty to keep her safe  during her hospitalization so he needed to call staff for any and all needs the patient may have. Verbalized understanding. Also informed family member and patient that violence against staff will not be tolerated. Patient and family member verbalized understanding. Patient appears more calm now that family member is in the room. Offered to meet any current needs the patient and family may have. Verbalized no needs. Bed in the lowest position, call bell within reach, offered pur wick but refused, cell phone and remote within reach, bed alarm in use. Will continue to monitor.

## 2018-07-21 ENCOUNTER — Inpatient Hospital Stay (HOSPITAL_COMMUNITY): Payer: BC Managed Care – PPO

## 2018-07-21 DIAGNOSIS — L309 Dermatitis, unspecified: Secondary | ICD-10-CM | POA: Diagnosis present

## 2018-07-21 DIAGNOSIS — E119 Type 2 diabetes mellitus without complications: Secondary | ICD-10-CM

## 2018-07-21 DIAGNOSIS — T148XXA Other injury of unspecified body region, initial encounter: Secondary | ICD-10-CM

## 2018-07-21 DIAGNOSIS — R509 Fever, unspecified: Secondary | ICD-10-CM

## 2018-07-21 DIAGNOSIS — Z885 Allergy status to narcotic agent status: Secondary | ICD-10-CM

## 2018-07-21 DIAGNOSIS — I808 Phlebitis and thrombophlebitis of other sites: Secondary | ICD-10-CM

## 2018-07-21 DIAGNOSIS — Z87311 Personal history of (healed) other pathological fracture: Secondary | ICD-10-CM

## 2018-07-21 DIAGNOSIS — F17210 Nicotine dependence, cigarettes, uncomplicated: Secondary | ICD-10-CM

## 2018-07-21 DIAGNOSIS — S82851A Displaced trimalleolar fracture of right lower leg, initial encounter for closed fracture: Secondary | ICD-10-CM | POA: Diagnosis present

## 2018-07-21 DIAGNOSIS — F1020 Alcohol dependence, uncomplicated: Secondary | ICD-10-CM

## 2018-07-21 LAB — COMPREHENSIVE METABOLIC PANEL
ALT: 37 U/L (ref 0–44)
AST: 143 U/L — ABNORMAL HIGH (ref 15–41)
Albumin: 3 g/dL — ABNORMAL LOW (ref 3.5–5.0)
Alkaline Phosphatase: 106 U/L (ref 38–126)
Anion gap: 13 (ref 5–15)
BUN: 5 mg/dL — ABNORMAL LOW (ref 6–20)
CHLORIDE: 98 mmol/L (ref 98–111)
CO2: 21 mmol/L — ABNORMAL LOW (ref 22–32)
Calcium: 7.6 mg/dL — ABNORMAL LOW (ref 8.9–10.3)
Creatinine, Ser: 0.51 mg/dL (ref 0.44–1.00)
GFR calc Af Amer: >60 mL/min (ref >60)
GFR calc non Af Amer: >60 mL/min (ref >60)
Glucose, Bld: 103 mg/dL — ABNORMAL HIGH (ref 70–99)
Potassium: 3 mmol/L — ABNORMAL LOW (ref 3.5–5.1)
SODIUM: 132 mmol/L — AB (ref 135–145)
Total Bilirubin: 2.8 mg/dL — ABNORMAL HIGH (ref 0.3–1.2)
Total Protein: 7.5 g/dL (ref 6.5–8.1)

## 2018-07-21 LAB — CBC
HEMATOCRIT: 35.8 % — AB (ref 36.0–46.0)
Hemoglobin: 11.6 g/dL — ABNORMAL LOW (ref 12.0–15.0)
MCH: 27.9 pg (ref 26.0–34.0)
MCHC: 32.4 g/dL (ref 30.0–36.0)
MCV: 86.1 fL (ref 80.0–100.0)
PLATELETS: 173 10*3/uL (ref 150–400)
RBC: 4.16 MIL/uL (ref 3.87–5.11)
RDW: 13.5 % (ref 11.5–15.5)
WBC: 6.9 10*3/uL (ref 4.0–10.5)
nRBC: 0 % (ref 0.0–0.2)

## 2018-07-21 LAB — GLUCOSE, CAPILLARY
Glucose-Capillary: 104 mg/dL — ABNORMAL HIGH (ref 70–99)
Glucose-Capillary: 102 mg/dL — ABNORMAL HIGH (ref 70–99)
Glucose-Capillary: 108 mg/dL — ABNORMAL HIGH (ref 70–99)
Glucose-Capillary: 111 mg/dL — ABNORMAL HIGH (ref 70–99)

## 2018-07-21 MED ORDER — LIDOCAINE HCL 1 % IJ SOLN
INTRAMUSCULAR | Status: AC
  Filled 2018-07-21: qty 20

## 2018-07-21 MED ORDER — VANCOMYCIN HCL 10 G IV SOLR
1750.0000 mg | Freq: Once | INTRAVENOUS | Status: AC
  Administered 2018-07-21: 1750 mg via INTRAVENOUS
  Filled 2018-07-21: qty 1750

## 2018-07-21 MED ORDER — VANCOMYCIN HCL 10 G IV SOLR
1250.0000 mg | INTRAVENOUS | Status: DC
  Administered 2018-07-22 – 2018-07-25 (×4): 1250 mg via INTRAVENOUS
  Filled 2018-07-21 (×4): qty 1250

## 2018-07-21 MED ORDER — IBUPROFEN 200 MG PO TABS
400.0000 mg | ORAL_TABLET | Freq: Four times a day (QID) | ORAL | Status: DC | PRN
  Administered 2018-07-21 – 2018-07-22 (×2): 400 mg via ORAL
  Filled 2018-07-21 (×2): qty 2

## 2018-07-21 MED ORDER — ACETAMINOPHEN 325 MG PO TABS
650.0000 mg | ORAL_TABLET | Freq: Four times a day (QID) | ORAL | Status: DC | PRN
  Filled 2018-07-21: qty 2

## 2018-07-21 NOTE — Progress Notes (Signed)
SLP Cancellation Note  Patient Details Name: Zakari Marotte MRN: [id number] DOB: [birth year]   Cancelled treatment:       Reason Eval/Treat Not Completed: Patient at procedure or test/unavailable   Fionn Stracke, Riley Nearing 07/21/2018, 12:54 PM

## 2018-07-21 NOTE — Consult Note (Signed)
[facility]    Date of Admission:  07/17/2018          Reason for Consult: Fever due to septic thrombophlebitis of left antecubital vein    Referring Provider: Dr. Zannie CovePreetha Joseph  Assessment: I suspect that her recent fevers are due to superficial septic thrombophlebitis of her left antecubital vein caused by a recent IV.  Blood cultures are negative so far.  We will start IV vancomycin to cover the most likely culprits.  Plan: 1. Start vancomycin 2. 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 docusate sodium  100 mg Oral BID  . enoxaparin (LOVENOX) injection  40 mg Subcutaneous Q24H  . fluocinonide cream   Topical BID  . folic acid  1 mg Oral Daily  . insulin aspart  0-9 Units Subcutaneous TID WC  . LORazepam  2 mg Intravenous Once  . magic mouthwash w/lidocaine  15 mL Oral QID  . mouth rinse  15 mL Mouth Rinse BID  . multivitamin with minerals  1 tablet Oral Daily  . polyethylene glycol  17 g Oral Daily  . propranolol  20 mg Oral Daily  . sertraline  100 mg Oral Daily  . thiamine  100 mg Oral Daily   Continuous Infusions: PRN Meds:.HYDROcodone-acetaminophen, ibuprofen, LORazepam, magic mouthwash w/lidocaine, morphine injection, ondansetron (ZOFRAN) IV, phenol, pneumococcal 23 valent vaccine  HPI: Angela Villegas is a 28 y.o. female with diabetes and alcohol dependence.  She recently cut down on her alcohol consumption and suffered to withdrawal seizures.  She suffered a right ankle fracture.  She was admitted to the intensive care unit and then moved to a general ward  once stabilized.  She has developed fevers over the past 48 hours.  Surgery on her ankle fracture has been postponed.   Review of Systems: Review of Systems  Constitutional: Positive for fever and malaise/fatigue. Negative for chills and diaphoresis.  Respiratory: Positive for cough. Negative for sputum production and shortness of breath.        Some mild nonproductive cough after a respiratory illness 1 month ago.  This is improving.  Cardiovascular: Negative for chest pain.  Gastrointestinal: Negative for abdominal pain, diarrhea, nausea and vomiting.  Genitourinary: Negative for dysuria.  Musculoskeletal: Positive for joint pain. Negative for back pain.  Skin:       She has chronic eczema but no acute rash.  Neurological: Negative for headaches.  Psychiatric/Behavioral: Positive for substance abuse.    Past Medical History:  Diagnosis Date  . ADD (attention deficit disorder)   . Alcohol abuse   . Asthma   . Diabetes mellitus (HCC)   . Diabetes mellitus without complication (HCC)   . Hypertension   . Liver disease, unspecified     Social History   Tobacco Use  . Smoking status: Current Every Day Smoker    Packs/day: 1.00    Types: Cigarettes  . Smokeless tobacco: Never Used  Substance Use Topics  . Alcohol use: Yes    Comment: 1 fifth of liquor daily or more   .  Drug use: No    Family History  Problem Relation Age of Onset  . GER disease Mother    Allergies  Allergen Reactions  . Vicodin [Hydrocodone-Acetaminophen] Itching and Nausea Only    OBJECTIVE: Blood pressure (!) 137/96, pulse 91, temperature (!) 101.3 F (38.5 C), temperature source Oral, resp. rate 18, height 5\' 1"  (1.549 m), weight 73.7 kg, last menstrual period 07/20/2018, SpO2 97 %.  Physical Exam Constitutional:      Comments: She is very calm and resting quietly in bed.  HENT:     Mouth/Throat:     Pharynx: No oropharyngeal exudate.     Comments: She has bite marks on her tongue  bilaterally but no evidence of infection. Eyes:     Conjunctiva/sclera: Conjunctivae normal.  Cardiovascular:     Rate and Rhythm: Normal rate and regular rhythm.     Heart sounds: No murmur.  Pulmonary:     Effort: Pulmonary effort is normal.     Breath sounds: Normal breath sounds.  Abdominal:     General: There is no distension.     Palpations: Abdomen is soft. There is no mass.     Tenderness: There is no abdominal tenderness.  Skin:    Comments: She has chronic skin lesions from her eczema but no abscess or other acute changes related to the eczema.  She has swelling redness and slight tenderness at the site of the left antecubital IV that was placed on admission.  The IV was removed yesterday.  No expressible drainage.  Psychiatric:        Mood and Affect: Mood normal.     Lab Results Lab Results  Component Value Date   WBC 6.9 07/21/2018   HGB 11.6 (L) 07/21/2018   HCT 35.8 (L) 07/21/2018   MCV 86.1 07/21/2018   PLT 173 07/21/2018    Lab Results  Component Value Date   CREATININE 0.51 07/21/2018   BUN 5 (L) 07/21/2018   NA 132 (L) 07/21/2018   K 3.0 (L) 07/21/2018   CL 98 07/21/2018   CO2 21 (L) 07/21/2018    Lab Results  Component Value Date   ALT 37 07/21/2018   AST 143 (H) 07/21/2018   ALKPHOS 106 07/21/2018   BILITOT 2.8 (H) 07/21/2018     Microbiology: Recent Results (from the past 240 hour(s))  MRSA PCR Screening     Status: None   Collection Time: 07/17/18  3:00 PM  Result Value Ref Range Status   MRSA by PCR NEGATIVE NEGATIVE Final    Comment:        The GeneXpert MRSA Assay (FDA approved for NASAL specimens only), is one component of a comprehensive MRSA colonization surveillance program. It is not intended to diagnose MRSA infection nor to guide or monitor treatment for MRSA infections. Performed at [facility], [address], [place], [place] [address]   Culture, Urine     Status: Abnormal (Preliminary result)   Collection  Time: 07/20/18  9:17 AM  Result Value Ref Range Status   Specimen Description URINE, RANDOM  Final   Special Requests   Final    NONE Performed at [facility], [address], [place], [place] [address]    Culture >=100,000 COLONIES/mL ENTEROCOCCUS FAECALIS (A)  Final   Report Status PENDING  Incomplete  Culture, blood (routine x 2)     Status: None (Preliminary result)   Collection Time: 07/20/18 10:45 AM  Result Value Ref Range Status  Specimen Description BLOOD RIGHT HAND  Final   Special Requests   Final    BOTTLES DRAWN AEROBIC AND ANAEROBIC Blood Culture adequate volume   Culture   Final    NO GROWTH 1 DAY Performed at [facility], [address] [place], [place] [address]    Report Status PENDING  Incomplete  Culture, blood (routine x 2)     Status: None (Preliminary result)   Collection Time: 07/20/18 10:51 AM  Result Value Ref Range Status   Specimen Description BLOOD LEFT HAND  Final   Special Requests   Final    BOTTLES DRAWN AEROBIC AND ANAEROBIC Blood Culture adequate volume   Culture   Final    NO GROWTH 1 DAY Performed at [facility], [address] [place], [place] [address]    Report Status PENDING  Incomplete    Cliffton AstersJohn Yoseline Andersson, MD [facility] [phone] pager   [phone] cell 07/21/2018, 6:11 PM

## 2018-07-21 NOTE — Progress Notes (Addendum)
[facility] Progress Note  Patient: Angela Villegas RUE:[id number]   PCP: Shirlean MylarWebb, Carol, MD DOB: 12/27/1990   DOA: 07/17/2018   DOS: 07/21/2018   Date of Service: the patient was seen and examined on 07/21/2018  Brief hospital course: Pt. with PMH of cirrhosis, alcohol abuse and withdrawal, ADD, and T2DM ; admitted on 07/17/2018, presented with complaint of seizure, was found to have alcohol withdrawal induced seizure along with right ankle fracture and tongue laceration.  She typically drinks 1/2 liter of vodka a day, last drink was 1 07/16/2018.  2 seizures at home, 1 seizure here in the ER.  Started on telemetry admitted to ICU,.  [facility] -Plan was for ORIF 1/8 however she was febrile to 101.3 1/8 am and afebrile rest of the day, febrile to 101.6 this am again  Subjective:  -No new complaints, found to have a fever of 101.6 this a.m. -Denies any worsening cough congestion, no abdominal pain nausea or vomiting, has some pain at fracture site  Assessment and Plan:  Alcohol withdrawal seizures  -Was admitted to the ICU on Precedex -Currently stabilized and improved from this standpoint, she was started on 500 mg twice daily Keppra due to multiple seizures, doubt she needs long-term antiepileptic therapy since etiology of seizures is clearly acute alcohol withdrawal, d/w with Neurology, doesn't need long term AEDs and this can be stopped -taper off keppra today -Currently on Ativan as needed, thiamine, CIWA protocol  -No evidence of active withdrawal symptoms at this time  -No driving/operating machinery etc for atleast 6months  Trimalleolar right ankle fracture -Plan for ORIF, surgery postponed due to fever -Ortho following  Fever -Etiology not clear at this time -Febrile yesterday morning and again this morning, UA chest x-ray blood cultures unrevealing  -Check Dopplers to rule out DVT and check US and do paracentesis if she has ascites due to h/o ascites and  cirrhosis   Alcohol induced cirrhosis. -Resume Inderal at home dose of 20 mg daily  -No longer on diuretics, will consider resuming this soon   Tongue laceration. PCCM discussed with ENT, recommend oral and topical pain medication. SLP evaluation currently recommending full liquid diet secondary to difficulty swallowing secondary to pain.  Type 2 Diabetes Mellitus, uncontroled without any complication Hemoglobin A1c was 6.4 , compared to 11 in 2019, -Home medications held, continue sliding scale insulin  Obesity Body mass index is 30.7 kg/m.  Dietary consultation. Currently unable to eat anything due to tongue laceration.  Urinary retention. -resolved, Foley catheter discontinued  Diet: full liquid diet DVT Prophylaxis: subcutaneous Heparin  Advance goals of care discussion: full code  Family Communication: Spouse at bedside  Disposition: Discharge to be determined.  Consultants: PCCM, orthopedics  Procedures: none  Scheduled Meds: . lidocaine      . docusate sodium  100 mg Oral BID  . enoxaparin (LOVENOX) injection  40 mg Subcutaneous Q24H  . fluocinonide cream   Topical BID  . folic acid  1 mg Oral Daily  . insulin aspart  0-9 Units Subcutaneous TID WC  . levETIRAcetam  250 mg Oral BID  . LORazepam  2 mg Intravenous Once  . magic mouthwash w/lidocaine  15 mL Oral QID  . mouth rinse  15 mL Mouth Rinse BID  . multivitamin with minerals  1 tablet Oral Daily  . polyethylene glycol  17 g Oral Daily  . propranolol  20 mg Oral Daily  . sertraline  100 mg Oral Daily  . thiamine  100 mg Oral Daily   Continuous Infusions:  PRN Meds: HYDROcodone-acetaminophen, LORazepam, magic mouthwash w/lidocaine, morphine injection, ondansetron (ZOFRAN) IV, phenol, pneumococcal 23 valent vaccine Antibiotics: Anti-infectives (From admission, onward)   None       Objective: Physical Exam: Vitals:   07/20/18 2311 07/21/18 0819 07/21/18 0820 07/21/18 1154  BP: (!) 132/95 (!)  123/103  (!) 136/98  Pulse: (!) 103 (!) 117 (!) 117   Resp: (!) 22 18    Temp: 99.1 F (37.3 C) (!) 100.9 F (38.3 C) (!) 101.6 F (38.7 C)   TempSrc: Oral Oral Oral   SpO2: 94% 97% 99%   Weight:      Height:        Intake/Output Summary (Last 24 hours) at 07/21/2018 1205 Last data filed at 07/21/2018 0900 Gross per 24 hour  Intake 619.95 ml  Output 700 ml  Net -80.05 ml   Filed Weights   07/18/18 0352 07/19/18 0441 07/20/18 0405  Weight: 73.7 kg 73.7 kg 73.7 kg   Gen: Chronically ill female, laying in bed, no distress, alert awake oriented x3 awake, Alert, Oriented X 3,  HEENT: PERRLA, Neck supple, no JVD Lungs: Decreased breath sounds at both bases, rest clear CVS: RRR,No Gallops,Rubs or new Murmurs Abd: Soft, nontender, mildly distended, fluid thrill present, bowel sounds present Extremities: Right ankle swelling with dressing/Ace wrap Neuro: Moves all extremities, no localizing signs, no asterixis  Data Reviewed: CBC: Recent Labs  Lab 07/17/18 0651 07/18/18 0657 07/19/18 0233 07/20/18 0315 07/21/18 0320  WBC 9.5 6.1 6.5 6.8 6.9  NEUTROABS 8.2*  --   --  5.0  --   HGB 14.4 11.8* 12.2 11.4* 11.6*  HCT 42.9 35.8* 36.4 35.1* 35.8*  MCV 85.5 87.7 87.3 86.0 86.1  PLT 183 113* 120* 136* 173   Basic Metabolic Panel: Recent Labs  Lab 07/17/18 0651 07/17/18 1539 07/18/18 0351 07/19/18 0233 07/20/18 0315 07/21/18 0320  NA 131*  --  137 133* 131* 132*  K 4.2  --  3.9 3.8 3.2* 3.0*  CL 91*  --  105 101 100 98  CO2 23  --  19* 21* 21* 21*  GLUCOSE 198*  --  94 103* 104* 103*  BUN 6  --  11 <5* <5* 5*  CREATININE 0.60  --  0.56 0.47 0.49 0.51  CALCIUM 8.6*  --  7.7* 7.5* 7.5* 7.6*  MG  --  1.4*  --  2.0 1.6*  --   PHOS  --  4.4  --   --   --   --     Liver Function Tests: Recent Labs  Lab 07/17/18 0651 07/18/18 0351 07/19/18 0233 07/20/18 0315 07/21/18 0320  AST 202* 142* 135* 124* 143*  ALT 34 27 32 32 37  ALKPHOS 114 89 96 102 106  BILITOT 2.9* 2.6*  2.8* 2.8* 2.8*  PROT 8.8* 7.2 7.4 7.4 7.5  ALBUMIN 3.7 3.1* 3.1* 3.0* 3.0*   No results for input(s): LIPASE, AMYLASE in the last 168 hours. No results for input(s): AMMONIA in the last 168 hours. Coagulation Profile: Recent Labs  Lab 07/19/18 0233 07/20/18 0315  INR 1.15 1.16   Cardiac Enzymes: No results for input(s): CKTOTAL, CKMB, CKMBINDEX, TROPONINI in the last 168 hours. BNP (last 3 results) No results for input(s): PROBNP in the last 8760 hours. CBG: Recent Labs  Lab 07/20/18 1318 07/20/18 1716 07/20/18 1930 07/20/18 2327 07/21/18 0737  GLUCAP 106* 122* 110* 113* 104*   Studies: No results  found.   Time spent: 25 minutes  Author: Zannie Cove, MD  07/21/2018 12:05 PM  Between 7PM-7AM, please contact night-coverage at [email or web address], password [facility]

## 2018-07-21 NOTE — Progress Notes (Signed)
Inpatient Diabetes Program Recommendations  AACE/ADA: New Consensus Statement on Inpatient Glycemic Control (2015)  Target Ranges:  Prepandial:   less than 140 mg/dL      Peak postprandial:   less than 180 mg/dL (1-2 hours)      Critically ill patients:  140 - 180 mg/dL   Lab Results  Component Value Date   GLUCAP 104 (H) 07/21/2018   HGBA1C 6.4 (H) 07/20/2018    Review of Glycemic Control Results for Angela Villegas, Angela Villegas (MRN [id number]) as of 07/21/2018 10:08  Ref. Range 07/20/2018 13:18 07/20/2018 17:16 07/20/2018 19:30 07/20/2018 23:27 07/21/2018 07:37  Glucose-Capillary Latest Ref Range: 70 - 99 mg/dL 112 (H) 162 (H) 446 (H) 113 (H) 104 (H)   Diabetes history: DM  Outpatient Diabetes medications:  Lantus 24 units daily Current orders for Inpatient glycemic control:  Novolog sensitive tid with meals  Inpatient Diabetes Program Recommendations:   Referral received noting that patient has "new" diagnosis of DM.  However after review of chart, patient was diagnosed with DM in 07/2017.  Patient was seen by DM coordinator and taught insulin pen, basics of DM, etc.  A1C last year was 11.5% and is now 6.4%.  She does not appear to need basal insulin at this time.  Note plans for surgery today.  Will follow patient and talk to her and family when appropriate.    Thanks,  Beryl Meager, RN, BC-ADM Inpatient Diabetes Coordinator Pager [phone] (8a-5p)

## 2018-07-21 NOTE — Progress Notes (Signed)
Pharmacy Antibiotic Note  Angela Villegas is a 28 y.o. female admitted on 07/17/2018 with concern for septic thrombophlebitis   Plan: Vancomycin x 1750 mg x 1  Vancomycin 1250 mg q24h Monitor renal fx cx vanc lvls prn  Height: 5\' 1"  (154.9 cm) Weight: 162 lb 7.7 oz (73.7 kg) IBW/kg (Calculated) : 47.8  Temp (24hrs), Avg:101.2 F (38.4 C), Min:99.1 F (37.3 C), Max:102.9 F (39.4 C)  Recent Labs  Lab 07/17/18 0651 07/18/18 0351 07/18/18 0657 07/19/18 0233 07/20/18 0315 07/21/18 0320  WBC 9.5  --  6.1 6.5 6.8 6.9  CREATININE 0.60 0.56  --  0.47 0.49 0.51    Estimated Creatinine Clearance: 96.2 mL/min (by C-G formula based on SCr of 0.51 mg/dL).    Allergies  Allergen Reactions  . Vicodin [Hydrocodone-Acetaminophen] Itching and Nausea Only   Vancomycin 1250 mg IV Q 24 hrs. Goal AUC 400-550. Expected AUC: 489 SCr used: 0.8  Isaac Bliss, PharmD, BCPS, BCCCP Clinical Pharmacist [phone]  Please check AMION for all [facility] Pharmacy numbers  07/21/2018 6:38 PM

## 2018-07-21 NOTE — Progress Notes (Signed)
Reviewed imaging and discussed case with Dr. Everardo Pacific. Have tentatively placed patient on surgery schedule for tomorrow. Febrile this AM. Would like patient to be afebrile for at least 24 hours and with blood cultures negative for 48 hours before performing surgery and placing hardware. Will review chart and examine patient tomorrow AM and decide whether to postpone surgery further.  Roby Lofts, MD [facility] [phone] (phone)

## 2018-07-21 NOTE — Progress Notes (Signed)
Bilateral lower extremity venous duplex has been completed. Preliminary results can be found in CV Proc through chart review.   07/21/18 1:27 PM Olen Cordial RVT

## 2018-07-22 ENCOUNTER — Encounter (HOSPITAL_COMMUNITY)
Admission: EM | Disposition: A | Discharge status: Home Health | Payer: Self-pay | Source: Home / Self Care | Attending: Internal Medicine

## 2018-07-22 DIAGNOSIS — R011 Cardiac murmur, unspecified: Secondary | ICD-10-CM

## 2018-07-22 DIAGNOSIS — X58XXXD Exposure to other specified factors, subsequent encounter: Secondary | ICD-10-CM

## 2018-07-22 DIAGNOSIS — S82891G Other fracture of right lower leg, subsequent encounter for closed fracture with delayed healing: Secondary | ICD-10-CM

## 2018-07-22 LAB — CBC
HCT: 35.3 % — ABNORMAL LOW (ref 36.0–46.0)
Hemoglobin: 12 g/dL (ref 12.0–15.0)
MCH: 29 pg (ref 26.0–34.0)
MCHC: 34 g/dL (ref 30.0–36.0)
MCV: 85.3 fL (ref 80.0–100.0)
Platelets: 194 10*3/uL (ref 150–400)
RBC: 4.14 MIL/uL (ref 3.87–5.11)
RDW: 13.5 % (ref 11.5–15.5)
WBC: 6.5 10*3/uL (ref 4.0–10.5)
nRBC: 0 % (ref 0.0–0.2)

## 2018-07-22 LAB — GLUCOSE, CAPILLARY
GLUCOSE-CAPILLARY: 119 mg/dL — AB (ref 70–99)
Glucose-Capillary: 105 mg/dL — ABNORMAL HIGH (ref 70–99)
Glucose-Capillary: 135 mg/dL — ABNORMAL HIGH (ref 70–99)
Glucose-Capillary: 101 mg/dL — ABNORMAL HIGH (ref 70–99)

## 2018-07-22 LAB — BASIC METABOLIC PANEL
Anion gap: 11 (ref 5–15)
BUN: 6 mg/dL (ref 6–20)
CO2: 22 mmol/L (ref 22–32)
Calcium: 7.5 mg/dL — ABNORMAL LOW (ref 8.9–10.3)
Chloride: 96 mmol/L — ABNORMAL LOW (ref 98–111)
Creatinine, Ser: 0.42 mg/dL — ABNORMAL LOW (ref 0.44–1.00)
GFR calc Af Amer: >60 mL/min (ref >60)
GFR calc non Af Amer: >60 mL/min (ref >60)
Glucose, Bld: 113 mg/dL — ABNORMAL HIGH (ref 70–99)
POTASSIUM: 3.1 mmol/L — AB (ref 3.5–5.1)
Sodium: 129 mmol/L — ABNORMAL LOW (ref 135–145)

## 2018-07-22 LAB — URINE CULTURE: Culture: >=100000 — AB

## 2018-07-22 SURGERY — OPEN REDUCTION INTERNAL FIXATION (ORIF) ANKLE FRACTURE
Anesthesia: General | Laterality: Right

## 2018-07-22 MED ORDER — PROPOFOL 10 MG/ML IV BOLUS
INTRAVENOUS | Status: AC
  Filled 2018-07-22: qty 40

## 2018-07-22 MED ORDER — POTASSIUM CHLORIDE CRYS ER 20 MEQ PO TBCR
40.0000 meq | EXTENDED_RELEASE_TABLET | Freq: Two times a day (BID) | ORAL | Status: AC
  Administered 2018-07-22 (×2): 40 meq via ORAL
  Filled 2018-07-22 (×2): qty 2

## 2018-07-22 NOTE — Progress Notes (Signed)
[facility]  Date of Admission:  07/17/2018   Total days of antibiotics 2        Day 2 Vancomycin       ASSESSMENT: Angela Villegas is a 28 year old female who presented with alcohol withdrawal seizure and subsequent right ankle fracture. She underwent detox in the ICU and was transferred to the floor on 07/19/18. Surgical repair of right ankle fracture delayed due to fevers. Her fevers are likely due to superficial septic thrombophlebitis of her left antecubital vein caused by a recent IV. Spiked fever of 101 this morning. Blood cultures remain negative, will continue antibiotics.  PLAN:  1.  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 docusate sodium  100 mg Oral BID  . enoxaparin (LOVENOX) injection  40 mg Subcutaneous Q24H  . fluocinonide cream   Topical BID  . folic acid  1 mg Oral Daily  . insulin aspart  0-9 Units Subcutaneous TID WC  . LORazepam  2 mg Intravenous Once  . magic mouthwash w/lidocaine  15 mL Oral QID  . mouth rinse  15 mL Mouth Rinse BID  . multivitamin with minerals  1 tablet Oral Daily  . polyethylene glycol  17 g Oral Daily  . potassium chloride  40 mEq Oral BID  . propranolol  20 mg Oral Daily  . sertraline  100 mg Oral Daily  . thiamine  100 mg Oral Daily   Continuous Infusions: . vancomycin     PRN Meds:.HYDROcodone-acetaminophen, ibuprofen, LORazepam, magic mouthwash w/lidocaine, morphine injection, ondansetron (ZOFRAN) IV, phenol, pneumococcal 23 valent vaccine   SUBJECTIVE: Patient states she feels about the  same as yesterday but fevers are a little better. Did have a fever this morning but states this fever broke.  Review of Systems: Review of Systems  Constitutional: Positive for fever.  Genitourinary: Negative for dysuria and frequency.    Allergies  Allergen Reactions  . Vicodin [Hydrocodone-Acetaminophen] Itching and Nausea Only    OBJECTIVE: Vitals:   07/21/18 1646 07/21/18 2313 07/22/18 0634 07/22/18 0815  BP: (!) 137/96 (!) 156/84  (!) 129/93  Pulse: 91 88  (!) 105  Resp: 18 18    Temp: (!) 101.3 F (38.5 C) 98.7 F (37.1 C)  (!) 101 F (38.3 C)  TempSrc: Oral Oral  Oral  SpO2: 97% 100%  98%  Weight:   73.9 kg   Height:       Body mass index is 30.78 kg/m.  Physical Exam Constitutional:      General: She is not in acute distress. Cardiovascular:     Rate and Rhythm: Normal rate and regular rhythm.     Heart sounds: Murmur present.  Pulmonary:     Effort: Pulmonary effort is normal. No respiratory distress.     Breath sounds: Normal breath sounds.  Abdominal:     General: Bowel sounds are normal. There is no distension.     Palpations: Abdomen is soft.     Tenderness: There is no abdominal tenderness.  Musculoskeletal:  Left lower leg: No edema.     Comments: Erythema over left antecubital vein, with small <1cm nodular mas proximally RLE in splint  Skin:    General: Skin is warm and dry.  Neurological:     Mental Status: She is alert.     Lab Results Lab Results  Component Value Date   WBC 6.5 07/22/2018   HGB 12.0 07/22/2018   HCT 35.3 (L) 07/22/2018   MCV 85.3 07/22/2018   PLT 194 07/22/2018    Lab Results  Component Value Date   CREATININE 0.42 (L) 07/22/2018   BUN 6 07/22/2018   NA 129 (L) 07/22/2018   K 3.1 (L) 07/22/2018   CL 96 (L) 07/22/2018   CO2 22 07/22/2018    Lab Results  Component Value Date   ALT 37 07/21/2018   AST 143 (H) 07/21/2018   ALKPHOS 106 07/21/2018   BILITOT 2.8 (H) 07/21/2018     Microbiology: Recent  Results (from the past 240 hour(s))  MRSA PCR Screening     Status: None   Collection Time: 07/17/18  3:00 PM  Result Value Ref Range Status   MRSA by PCR NEGATIVE NEGATIVE Final    Comment:        The GeneXpert MRSA Assay (FDA approved for NASAL specimens only), is one component of a comprehensive MRSA colonization surveillance program. It is not intended to diagnose MRSA infection nor to guide or monitor treatment for MRSA infections. Performed at [facility], [address] [place], [place] [address]   Culture, Urine     Status: Abnormal   Collection Time: 07/20/18  9:17 AM  Result Value Ref Range Status   Specimen Description URINE, RANDOM  Final   Special Requests   Final    NONE Performed at [facility], [address] [place], [place] [address]    Culture >=100,000 COLONIES/mL ENTEROCOCCUS FAECALIS (A)  Final   Report Status 07/22/2018 FINAL  Final   Organism ID, Bacteria ENTEROCOCCUS FAECALIS (A)  Final      Susceptibility   Enterococcus faecalis - MIC*    AMPICILLIN <=2 SENSITIVE Sensitive     LEVOFLOXACIN 1 SENSITIVE Sensitive     NITROFURANTOIN <=16 SENSITIVE Sensitive     VANCOMYCIN 2 SENSITIVE Sensitive     * >=100,000 COLONIES/mL ENTEROCOCCUS FAECALIS  Culture, blood (routine x 2)     Status: None (Preliminary result)   Collection Time: 07/20/18 10:45 AM  Result Value Ref Range Status   Specimen Description BLOOD RIGHT HAND  Final   Special Requests   Final    BOTTLES DRAWN AEROBIC AND ANAEROBIC Blood Culture adequate volume   Culture   Final    NO GROWTH 2 DAYS Performed at [facility], [address] [place], [place] [address]    Report Status PENDING  Incomplete  Culture, blood (routine x 2)     Status: None (Preliminary result)   Collection Time: 07/20/18 10:51 AM  Result Value Ref Range Status   Specimen Description BLOOD LEFT HAND  Final   Special Requests   Final    BOTTLES DRAWN AEROBIC AND ANAEROBIC Blood Culture  adequate volume   Culture   Final    NO GROWTH 2 DAYS Performed at [facility], [address] [place], [place] [address]    Report Status PENDING  Incomplete  Culture, blood (routine x 2)     Status: None (Preliminary result)   Collection Time: 07/21/18  3:07 PM  Result Value Ref Range Status   Specimen Description BLOOD LEFT HAND  Final   Special Requests AEROBIC BOTTLE ONLY Blood Culture adequate volume  Final   Culture   Final    NO GROWTH < 24 HOURS Performed at [facility], [address] [place], [place] [address]    Report Status PENDING  Incomplete  Culture, blood (routine x 2)     Status: None (Preliminary result)   Collection Time: 07/21/18  3:11 PM  Result Value Ref Range Status   Specimen Description BLOOD RIGHT HAND  Final   Special Requests AEROBIC BOTTLE ONLY Blood Culture adequate volume  Final   Culture   Final    NO GROWTH < 24 HOURS Performed at [facility], [address] [place], [place] [address]    Report Status PENDING  Incomplete    Beola CordAlexander Melvin, MD IM PGY-2 [phone] Attending: [phone] pager   [phone] cell 07/22/2018, 12:46 PM

## 2018-07-22 NOTE — Progress Notes (Signed)
[facility] Progress Note  Patient: Angela Villegas ZOX:[id number]   PCP: Shirlean Mylar, MD DOB: September 12, 1990   DOA: 07/17/2018   DOS: 07/22/2018   Date of Service: the patient was seen and examined on 07/22/2018  Brief hospital course: Pt. with PMH of cirrhosis, alcohol abuse and withdrawal, ADD, and T2DM ; admitted on 07/17/2018, presented with complaint of seizure, was found to have alcohol withdrawal induced seizure along with right ankle fracture and tongue laceration.  She typically drinks 1/2 liter of vodka a day, last drink was 1 07/16/2018.  2 seizures at home, 1 seizure here in the ER.  Started on telemetry admitted to ICU,.  Transferred to [facility] on 07/19/2018. -Plan was for ORIF 1/8 however has been febrile since, surgery deferred -1/9 ID consulted, started on IV vancomycin for possible thrombophlebitis  Subjective:  -Feels weak overall, febrile last evening and this morning, started IV vancomycin yesterday -Denies any abdominal pain nausea or vomiting  Assessment and Plan:  Alcohol withdrawal seizures  -Was admitted to the ICU on Precedex -Currently stabilized and improved from this standpoint, she was started on 500 mg twice daily Keppra admission, I discussed with neurology and she was slowly tapered off Keppra -Improved, no evidence of ongoing withdrawals at this time, continue thiamine -No driving/operating machinery etc for atleast 6months  Trimalleolar right ankle fracture -Plan for ORIF, surgery postponed due to fever -Ortho following, now considering surgery on Monday if afebrile  Fever/possibly thrombophlebitis of left antecubital vein -Greatly appreciate ID consult, she started IV vancomycin yesterday -Blood cultures are negative, UA was unremarkable however urine cultures grew enterococcus faecalis, she is asymptomatic from a urinary standpoint I suspect this is colonization regardless vancomycin should cover this -Dopplers negative for DVT, no ascites noted on abdominal  ultrasound so could not have SBP -Chest x-ray was unremarkable as well -Continue vancomycin today, monitor clinically   Alcohol induced cirrhosis. -Stable, restarted Inderal at home dose of 20 mg daily  -No longer on diuretics, will consider resuming this soon   Tongue laceration. PCCM discussed with ENT, recommend oral and topical pain medication. SLP evaluation currently recommending full liquid diet secondary to difficulty swallowing secondary to pain.  Type 2 Diabetes Mellitus, uncontroled without any complication Hemoglobin A1c was 6.4 , compared to 11 in 2019, -Home medications held, continue sliding scale insulin  Obesity Body mass index is 30.78 kg/m.  Dietary consultation. Currently unable to eat anything due to tongue laceration.  Urinary retention. -resolved, Foley catheter discontinued  Diet: full liquid diet DVT Prophylaxis: subcutaneous Heparin  Advance goals of care discussion: full code  Family Communication: Spouse at bedside  Disposition: Home pending ORIF and resolution of fevers  Consultants: PCCM, orthopedics  Procedures: none  Scheduled Meds: . docusate sodium  100 mg Oral BID  . enoxaparin (LOVENOX) injection  40 mg Subcutaneous Q24H  . fluocinonide cream   Topical BID  . folic acid  1 mg Oral Daily  . insulin aspart  0-9 Units Subcutaneous TID WC  . LORazepam  2 mg Intravenous Once  . magic mouthwash w/lidocaine  15 mL Oral QID  . mouth rinse  15 mL Mouth Rinse BID  . multivitamin with minerals  1 tablet Oral Daily  . polyethylene glycol  17 g Oral Daily  . potassium chloride  40 mEq Oral BID  . propranolol  20 mg Oral Daily  . sertraline  100 mg Oral Daily  . thiamine  100 mg Oral Daily   Continuous Infusions: . vancomycin  PRN Meds: HYDROcodone-acetaminophen, ibuprofen, LORazepam, magic mouthwash w/lidocaine, morphine injection, ondansetron (ZOFRAN) IV, phenol, pneumococcal 23 valent vaccine Antibiotics: Anti-infectives (From  admission, onward)   Start     Dose/Rate Route Frequency Ordered Stop   07/22/18 2000  vancomycin (VANCOCIN) 1,250 mg in sodium chloride 0.9 % 250 mL IVPB     1,250 mg 166.7 mL/hr over 90 Minutes Intravenous Every 24 hours 07/21/18 1837     07/21/18 2000  vancomycin (VANCOCIN) 1,750 mg in sodium chloride 0.9 % 500 mL IVPB     1,750 mg 250 mL/hr over 120 Minutes Intravenous  Once 07/21/18 1837 07/22/18 0011       Objective: Physical Exam: Vitals:   07/21/18 1646 07/21/18 2313 07/22/18 0634 07/22/18 0815  BP: (!) 137/96 (!) 156/84  (!) 129/93  Pulse: 91 88  (!) 105  Resp: 18 18    Temp: (!) 101.3 F (38.5 C) 98.7 F (37.1 C)  (!) 101 F (38.3 C)  TempSrc: Oral Oral  Oral  SpO2: 97% 100%  98%  Weight:   73.9 kg   Height:        Intake/Output Summary (Last 24 hours) at 07/22/2018 1142 Last data filed at 07/22/2018 0800 Gross per 24 hour  Intake 480 ml  Output -  Net 480 ml   Filed Weights   07/19/18 0441 07/20/18 0405 07/22/18 0634  Weight: 73.7 kg 73.7 kg 73.9 kg   Gen: Awake, Alert, Oriented X 3, obese chronically ill female, laying in bed, no distress HEENT: PERRLA, Neck supple, no JVD Lungs: Decreased breath sounds both bases, rest clear CVS: S1-S2/regular rate rhythm Abd: soft, Non tender, non distended, BS present Extremities: Right ankle swelling with dressing/Ace wrap Skin: Bilateral postauricular areas with scaling and eczema, left upper extremity antecubital vein with small area of swelling and redness Neuro: Moves all extremities, no localizing signs, no asterixis  Data Reviewed: CBC: Recent Labs  Lab 07/17/18 0651 07/18/18 0657 07/19/18 0233 07/20/18 0315 07/21/18 0320 07/22/18 0437  WBC 9.5 6.1 6.5 6.8 6.9 6.5  NEUTROABS 8.2*  --   --  5.0  --   --   HGB 14.4 11.8* 12.2 11.4* 11.6* 12.0  HCT 42.9 35.8* 36.4 35.1* 35.8* 35.3*  MCV 85.5 87.7 87.3 86.0 86.1 85.3  PLT 183 113* 120* 136* 173 194   Basic Metabolic Panel: Recent Labs  Lab  07/17/18 1539 07/18/18 0351 07/19/18 0233 07/20/18 0315 07/21/18 0320 07/22/18 0437  NA  --  137 133* 131* 132* 129*  K  --  3.9 3.8 3.2* 3.0* 3.1*  CL  --  105 101 100 98 96*  CO2  --  19* 21* 21* 21* 22  GLUCOSE  --  94 103* 104* 103* 113*  BUN  --  11 <5* <5* 5* 6  CREATININE  --  0.56 0.47 0.49 0.51 0.42*  CALCIUM  --  7.7* 7.5* 7.5* 7.6* 7.5*  MG 1.4*  --  2.0 1.6*  --   --   PHOS 4.4  --   --   --   --   --     Liver Function Tests: Recent Labs  Lab 07/17/18 0651 07/18/18 0351 07/19/18 0233 07/20/18 0315 07/21/18 0320  AST 202* 142* 135* 124* 143*  ALT 34 27 32 32 37  ALKPHOS 114 89 96 102 106  BILITOT 2.9* 2.6* 2.8* 2.8* 2.8*  PROT 8.8* 7.2 7.4 7.4 7.5  ALBUMIN 3.7 3.1* 3.1* 3.0* 3.0*   No results for input(s):  LIPASE, AMYLASE in the last 168 hours. No results for input(s): AMMONIA in the last 168 hours. Coagulation Profile: Recent Labs  Lab 07/19/18 0233 07/20/18 0315  INR 1.15 1.16   Cardiac Enzymes: No results for input(s): CKTOTAL, CKMB, CKMBINDEX, TROPONINI in the last 168 hours. BNP (last 3 results) No results for input(s): PROBNP in the last 8760 hours. CBG: Recent Labs  Lab 07/21/18 1237 07/21/18 1612 07/21/18 2129 07/22/18 0816 07/22/18 1139  GLUCAP 102* 108* 111* 105* 135*   Studies: Ir Abdomen US Limited  Result Date: 07/21/2018 CLINICAL DATA:  Evaluate for ascites and paracentesis. EXAM: LIMITED ABDOMEN ULTRASOUND FOR ASCITES TECHNIQUE: Limited ultrasound survey for ascites was performed in all four abdominal quadrants. COMPARISON:  12/12/2015 FINDINGS: No ascites identified in the 4 quadrants of the abdomen. Fluid-filled bladder identified in the right lower quadrant. IMPRESSION: No ascites identified. Electronically Signed   By: Richarda Overlie M.D.   On: 07/21/2018 15:57   Vas [place] Lower Extremity Venous (dvt)  Result Date: 07/21/2018  Lower Venous Study Indications: Trauma.  Limitations: Bandages. Performing Technologist: Chanda Busing  RVT  Examination Guidelines: A complete evaluation includes B-mode imaging, spectral Doppler, color Doppler, and power Doppler as needed of all accessible portions of each vessel. Bilateral testing is considered an integral part of a complete examination. Limited examinations for reoccurring indications may be performed as noted.  Right Venous Findings: +---------+---------------+---------+-----------+----------+-------+          CompressibilityPhasicitySpontaneityPropertiesSummary +---------+---------------+---------+-----------+----------+-------+ CFV      Full           Yes      Yes                          +---------+---------------+---------+-----------+----------+-------+ SFJ      Full                                                 +---------+---------------+---------+-----------+----------+-------+ FV Prox  Full                                                 +---------+---------------+---------+-----------+----------+-------+ FV Mid   Full                                                 +---------+---------------+---------+-----------+----------+-------+ FV DistalFull                                                 +---------+---------------+---------+-----------+----------+-------+ PFV      Full                                                 +---------+---------------+---------+-----------+----------+-------+ POP      Full           Yes      Yes                          +---------+---------------+---------+-----------+----------+-------+  PTV      Full                                                 +---------+---------------+---------+-----------+----------+-------+ PERO     Full                                                 +---------+---------------+---------+-----------+----------+-------+ Unable to assess entire calf due to fracture and bandages.  Left Venous Findings:  +---------+---------------+---------+-----------+----------+-------+          CompressibilityPhasicitySpontaneityPropertiesSummary +---------+---------------+---------+-----------+----------+-------+ CFV      Full           Yes      Yes                          +---------+---------------+---------+-----------+----------+-------+ SFJ      Full                                                 +---------+---------------+---------+-----------+----------+-------+ FV Prox  Full                                                 +---------+---------------+---------+-----------+----------+-------+ FV Mid   Full                                                 +---------+---------------+---------+-----------+----------+-------+ FV DistalFull                                                 +---------+---------------+---------+-----------+----------+-------+ PFV      Full                                                 +---------+---------------+---------+-----------+----------+-------+ POP      Full           Yes      Yes                          +---------+---------------+---------+-----------+----------+-------+ PTV      Full                                                 +---------+---------------+---------+-----------+----------+-------+ PERO     Full                                                 +---------+---------------+---------+-----------+----------+-------+  Summary: Right: There is no evidence of deep vein thrombosis in the lower extremity. However, portions of this examination were limited- see technologist comments above. No cystic structure found in the popliteal fossa. Left: There is no evidence of deep vein thrombosis in the lower extremity. No cystic structure found in the popliteal fossa.  *See table(s) above for measurements and observations. Electronically signed by Lemar Livings MD on 07/21/2018 at 2:40:44 PM.    Final      Time spent:  25 minutes  Author: Zannie Cove, MD  07/22/2018 11:42 AM  Between 7PM-7AM, please contact night-coverage at [email or web address], password [facility]

## 2018-07-22 NOTE — Progress Notes (Signed)
Spiked temperature of 102.9 yesterday afternoon. Will delay surgery for now. Possible surgery on Monday with Dr. Everardo Pacific. Will see how patient does over the weekend. Patient may have diet this AM from orthopaedic perspective.  Roby Lofts, MD [facility] [phone] (phone)

## 2018-07-22 NOTE — Progress Notes (Signed)
SLP Cancellation Note  Patient Details Name: Angela Villegas MRN: [id number] DOB: [birth year]   Cancelled treatment:       Reason Eval/Treat Not Completed: SLP screened, no needs identified, will sign off. Checked in with pt and husband. Tongue healing well, tolerating regular diet. No further concerns will sign off.    Bryar Rennie, Riley NearingBonnie Caroline 07/22/2018, 11:41 AM

## 2018-07-23 DIAGNOSIS — M2550 Pain in unspecified joint: Secondary | ICD-10-CM

## 2018-07-23 LAB — COMPREHENSIVE METABOLIC PANEL
ALT: 41 U/L (ref 0–44)
AST: 140 U/L — ABNORMAL HIGH (ref 15–41)
Albumin: 2.8 g/dL — ABNORMAL LOW (ref 3.5–5.0)
Alkaline Phosphatase: 107 U/L (ref 38–126)
Anion gap: 10 (ref 5–15)
BUN: <5 mg/dL — ABNORMAL LOW (ref 6–20)
CO2: 22 mmol/L (ref 22–32)
Calcium: 7.6 mg/dL — ABNORMAL LOW (ref 8.9–10.3)
Chloride: 101 mmol/L (ref 98–111)
Creatinine, Ser: 0.41 mg/dL — ABNORMAL LOW (ref 0.44–1.00)
GFR calc non Af Amer: >60 mL/min (ref >60)
Glucose, Bld: 116 mg/dL — ABNORMAL HIGH (ref 70–99)
Potassium: 3.8 mmol/L (ref 3.5–5.1)
Sodium: 133 mmol/L — ABNORMAL LOW (ref 135–145)
Total Bilirubin: 2.4 mg/dL — ABNORMAL HIGH (ref 0.3–1.2)
Total Protein: 7.2 g/dL (ref 6.5–8.1)

## 2018-07-23 LAB — CBC
HEMATOCRIT: 35.4 % — AB (ref 36.0–46.0)
Hemoglobin: 11.5 g/dL — ABNORMAL LOW (ref 12.0–15.0)
MCH: 27.8 pg (ref 26.0–34.0)
MCHC: 32.5 g/dL (ref 30.0–36.0)
MCV: 85.5 fL (ref 80.0–100.0)
Platelets: 242 10*3/uL (ref 150–400)
RBC: 4.14 MIL/uL (ref 3.87–5.11)
RDW: 13.6 % (ref 11.5–15.5)
WBC: 6.7 10*3/uL (ref 4.0–10.5)
nRBC: 0 % (ref 0.0–0.2)

## 2018-07-23 LAB — GLUCOSE, CAPILLARY
GLUCOSE-CAPILLARY: 132 mg/dL — AB (ref 70–99)
Glucose-Capillary: 101 mg/dL — ABNORMAL HIGH (ref 70–99)
Glucose-Capillary: 106 mg/dL — ABNORMAL HIGH (ref 70–99)
Glucose-Capillary: 114 mg/dL — ABNORMAL HIGH (ref 70–99)

## 2018-07-23 MED ORDER — FUROSEMIDE 20 MG PO TABS
20.0000 mg | ORAL_TABLET | Freq: Once | ORAL | Status: AC
  Administered 2018-07-23: 20 mg via ORAL
  Filled 2018-07-23: qty 1

## 2018-07-23 MED ORDER — LORAZEPAM 1 MG PO TABS
1.0000 mg | ORAL_TABLET | Freq: Four times a day (QID) | ORAL | Status: DC | PRN
  Administered 2018-07-23 – 2018-07-26 (×8): 1 mg via ORAL
  Filled 2018-07-23 (×9): qty 1

## 2018-07-23 NOTE — Progress Notes (Signed)
As patient was febrile 1/9 and 1/10 surgery was cancelled.  Plan on ORIF Monday 1/13 if stable.  Please keep NPO at midnight.    ORIF R ankle Surgeon: Emeterio Balke 

## 2018-07-23 NOTE — Progress Notes (Signed)
Patient ID: Angela Villegas, female   DOB: 08/29/1990, 29 y.o.   MRN: [id number]         [facility]  Date of Admission:  07/17/2018           Day 3 vancomycin ASSESSMENT: She is improving on empiric vancomycin for a small area of septic thrombophlebitis in her left antecubital vein.  She has defervesced.  Continuing vancomycin for a few more days while she is here.  We will follow-up on Monday, 07/25/2018.  PLAN: 1. 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 docusate sodium  100 mg Oral BID  . enoxaparin (LOVENOX) injection  40 mg Subcutaneous Q24H  . fluocinonide cream   Topical BID  . folic acid  1 mg Oral Daily  . insulin aspart  0-9 Units Subcutaneous TID WC  . LORazepam  2 mg Intravenous Once  . magic mouthwash w/lidocaine  15 mL Oral QID  . mouth rinse  15 mL Mouth Rinse BID  . multivitamin with minerals  1 tablet Oral Daily  . polyethylene glycol  17 g Oral Daily  . propranolol  20 mg Oral Daily  . sertraline  100 mg Oral Daily  . thiamine  100 mg Oral Daily   Continuous Infusions: . vancomycin 1,250 mg (07/22/18 1945)   PRN Meds:.HYDROcodone-acetaminophen, ibuprofen, LORazepam, magic mouthwash w/lidocaine, morphine injection, ondansetron (ZOFRAN) IV, phenol, pneumococcal 23 valent vaccine   SUBJECTIVE: She is feeling a little bit better.  Review of Systems: Review of Systems  Constitutional: Negative for chills, diaphoresis and fever.  Musculoskeletal: Positive for joint pain.    Allergies  Allergen Reactions  . Vicodin [Hydrocodone-Acetaminophen] Itching and  Nausea Only    OBJECTIVE: Vitals:   07/22/18 1746 07/22/18 2207 07/23/18 0548 07/23/18 0733  BP: 121/81 (!) 113/92  (!) 136/92  Pulse: 72 68  (!) 104  Resp:  (!) 26  18  Temp: 98.2 F (36.8 C) 99.3 F (37.4 C) 99.6 F (37.6 C) 98.2 F (36.8 C)  TempSrc: Oral  Oral Oral  SpO2: 99% 97%  100%  Weight:      Height:       Body mass index is 30.78 kg/m.  Physical Exam Constitutional:      Comments: She is resting quietly in bed.  Skin:    Comments: She has decreased redness, swelling and pain of her left antecubital vein.     Lab Results Lab Results  Component Value Date   WBC 6.7 07/23/2018   HGB 11.5 (L) 07/23/2018   HCT 35.4 (L) 07/23/2018   MCV 85.5 07/23/2018   PLT 242 07/23/2018    Lab Results  Component Value Date   CREATININE 0.41 (L) 07/23/2018   BUN <5 (L) 07/23/2018   NA 133 (L) 07/23/2018   K 3.8 07/23/2018   CL 101 07/23/2018   CO2 22 07/23/2018    Lab Results  Component Value Date   ALT 41 07/23/2018   AST 140 (H) 07/23/2018   ALKPHOS 107 07/23/2018   BILITOT 2.4 (H) 07/23/2018     Microbiology: Recent Results (from the past  240 hour(s))  MRSA PCR Screening     Status: None   Collection Time: 07/17/18  3:00 PM  Result Value Ref Range Status   MRSA by PCR NEGATIVE NEGATIVE Final    Comment:        The GeneXpert MRSA Assay (FDA approved for NASAL specimens only), is one component of a comprehensive MRSA colonization surveillance program. It is not intended to diagnose MRSA infection nor to guide or monitor treatment for MRSA infections. Performed at [facility], [address] [place], [place] [address]   Culture, Urine     Status: Abnormal   Collection Time: 07/20/18  9:17 AM  Result Value Ref Range Status   Specimen Description URINE, RANDOM  Final   Special Requests   Final    NONE Performed at [facility], [address] [place], [place] [address]    Culture >=100,000 COLONIES/mL ENTEROCOCCUS FAECALIS (A)   Final   Report Status 07/22/2018 FINAL  Final   Organism ID, Bacteria ENTEROCOCCUS FAECALIS (A)  Final      Susceptibility   Enterococcus faecalis - MIC*    AMPICILLIN <=2 SENSITIVE Sensitive     LEVOFLOXACIN 1 SENSITIVE Sensitive     NITROFURANTOIN <=16 SENSITIVE Sensitive     VANCOMYCIN 2 SENSITIVE Sensitive     * >=100,000 COLONIES/mL ENTEROCOCCUS FAECALIS  Culture, blood (routine x 2)     Status: None (Preliminary result)   Collection Time: 07/20/18 10:45 AM  Result Value Ref Range Status   Specimen Description BLOOD RIGHT HAND  Final   Special Requests   Final    BOTTLES DRAWN AEROBIC AND ANAEROBIC Blood Culture adequate volume   Culture   Final    NO GROWTH 3 DAYS Performed at [facility], [address] [place], [place] [address]    Report Status PENDING  Incomplete  Culture, blood (routine x 2)     Status: None (Preliminary result)   Collection Time: 07/20/18 10:51 AM  Result Value Ref Range Status   Specimen Description BLOOD LEFT HAND  Final   Special Requests   Final    BOTTLES DRAWN AEROBIC AND ANAEROBIC Blood Culture adequate volume   Culture   Final    NO GROWTH 3 DAYS Performed at [facility], [address] [place], [place] [address]    Report Status PENDING  Incomplete  Culture, blood (routine x 2)     Status: None (Preliminary result)   Collection Time: 07/21/18  3:07 PM  Result Value Ref Range Status   Specimen Description BLOOD LEFT HAND  Final   Special Requests AEROBIC BOTTLE ONLY Blood Culture adequate volume  Final   Culture   Final    NO GROWTH 2 DAYS Performed at [facility], [address] [place], [place] [address]    Report Status PENDING  Incomplete  Culture, blood (routine x 2)     Status: None (Preliminary result)   Collection Time: 07/21/18  3:11 PM  Result Value Ref Range Status   Specimen Description BLOOD RIGHT HAND  Final   Special Requests AEROBIC BOTTLE ONLY Blood Culture adequate volume  Final   Culture    Final    NO GROWTH 2 DAYS Performed at [facility], [address] [place], [place] [address]    Report Status PENDING  Incomplete    Cliffton AstersJohn Dajanee Voorheis, MD [facility] [phone] pager  [phone] cell 07/23/2018, 1:22 PM

## 2018-07-23 NOTE — H&P (View-Only) (Signed)
As patient was febrile 1/9 and 1/10 surgery was cancelled.  Plan on ORIF Monday 1/13 if stable.  Please keep NPO at midnight.    ORIF R ankle Surgeon: Everardo Pacific

## 2018-07-23 NOTE — Progress Notes (Signed)
[facility] Progress Note  Patient: Angela Villegas ZOX:[id number]   PCP: Shirlean MylarWebb, Carol, MD DOB: [birth year]   DOA: 07/17/2018   DOS: 07/23/2018   Date of Service: the patient was seen and examined on 07/23/2018  Brief hospital course: Pt. with PMH of cirrhosis, alcohol abuse and withdrawal, ADD, and T2DM ; admitted on 07/17/2018, presented with complaint of seizure, was found to have alcohol withdrawal induced seizure along with right ankle fracture and tongue laceration.  She typically drinks 1/2 liter of vodka a day, last drink was 1 07/16/2018.  2 seizures at home, 1 seizure here in the ER.  Started on telemetry admitted to ICU,.  [facility] -Plan was for ORIF 1/8 however has been febrile since, surgery deferred -1/9 ID consulted, started on IV vancomycin for possible thrombophlebitis  Subjective:  -Feels okay overall, no further fever since early yesterday  Assessment and Plan:  Alcohol withdrawal seizures  -Was admitted to the ICU on Precedex -improved from this standpoint, she was started on 500 mg twice daily Keppra admission, I discussed with neurology and she was slowly tapered off Keppra - no evidence of ongoing withdrawals at this time, continue thiamine -No driving/operating machinery etc for atleast 6months  Trimalleolar right ankle fracture -Plan for ORIF, surgery postponed due to fever -Ortho following, now considering surgery on Monday  Fever/thrombophlebitis of left antecubital vein -Greatly appreciate ID consult, she started IV vancomycin 1/9 -Blood cultures are negative, UA was unremarkable however urine cultures grew enterococcus faecalis, she is asymptomatic from a urinary standpoint I suspect this is colonization -Dopplers negative for DVT, no ascites noted on abdominal ultrasound so could not have SBP -Chest x-ray was unremarkable as well -Afebrile this morning, overall appears to be improving   Alcohol induced cirrhosis. -Stable, restarted  Inderal at home dose of 20 mg daily  -No longer on diuretics, will give single dose of Lasix today, appears slightly volume overloaded   Tongue laceration. PCCM discussed with ENT, recommend oral and topical pain medication. SLP evaluation currently recommending full liquid diet secondary to difficulty swallowing secondary to pain.  Type 2 Diabetes Mellitus, uncontroled without any complication Hemoglobin A1c was 6.4 , compared to 11 in 2019, -Home medications held, continue sliding scale insulin  Obesity Body mass index is 30.78 kg/m.  Dietary consultation. Currently unable to eat anything due to tongue laceration.  Urinary retention. -resolved, Foley catheter discontinued  DVT Prophylaxis: subcutaneous Heparin  Advance goals of care discussion: full code  Family Communication: Spouse at bedside  Disposition: Home pending ORIF and resolution of fevers  Consultants: PCCM, orthopedics  Procedures: none  Scheduled Meds: . docusate sodium  100 mg Oral BID  . enoxaparin (LOVENOX) injection  40 mg Subcutaneous Q24H  . fluocinonide cream   Topical BID  . folic acid  1 mg Oral Daily  . insulin aspart  0-9 Units Subcutaneous TID WC  . LORazepam  2 mg Intravenous Once  . magic mouthwash w/lidocaine  15 mL Oral QID  . mouth rinse  15 mL Mouth Rinse BID  . multivitamin with minerals  1 tablet Oral Daily  . polyethylene glycol  17 g Oral Daily  . propranolol  20 mg Oral Daily  . sertraline  100 mg Oral Daily  . thiamine  100 mg Oral Daily   Continuous Infusions: . vancomycin 1,250 mg (07/22/18 1945)   PRN Meds: HYDROcodone-acetaminophen, ibuprofen, LORazepam, magic mouthwash w/lidocaine, morphine injection, ondansetron (ZOFRAN) IV, phenol, pneumococcal 23 valent vaccine Antibiotics: Anti-infectives (From  admission, onward)   Start     Dose/Rate Route Frequency Ordered Stop   07/22/18 2000  vancomycin (VANCOCIN) 1,250 mg in sodium chloride 0.9 % 250 mL IVPB     1,250  mg 166.7 mL/hr over 90 Minutes Intravenous Every 24 hours 07/21/18 1837     07/21/18 2000  vancomycin (VANCOCIN) 1,750 mg in sodium chloride 0.9 % 500 mL IVPB     1,750 mg 250 mL/hr over 120 Minutes Intravenous  Once 07/21/18 1837 07/22/18 0011       Objective: Physical Exam: Vitals:   07/22/18 1746 07/22/18 2207 07/23/18 0548 07/23/18 0733  BP: 121/81 (!) 113/92  (!) 136/92  Pulse: 72 68  (!) 104  Resp:  (!) 26  18  Temp: 98.2 F (36.8 C) 99.3 F (37.4 C) 99.6 F (37.6 C) 98.2 F (36.8 C)  TempSrc: Oral  Oral Oral  SpO2: 99% 97%  100%  Weight:      Height:        Intake/Output Summary (Last 24 hours) at 07/23/2018 1143 Last data filed at 07/23/2018 0549 Gross per 24 hour  Intake 1010.46 ml  Output 600 ml  Net 410.46 ml   Filed Weights   07/19/18 0441 07/20/18 0405 07/22/18 0634  Weight: 73.7 kg 73.7 kg 73.9 kg   Gen: Awake, Alert, Oriented X 3, obese chronically ill female, no distress HEENT: PERRLA, Neck supple, no JVD Lungs: Good air movement bilaterally, CTAB CVS: RRR,No Gallops,Rubs or new Murmurs Abd: soft, Non tender, non distended, BS present Extremities: Left upper extremity antecubital vein with small area of swelling redness and tenderness, right foot with swelling and dressing, some edema and ecchymosis noted Skin: Bilateral postauricular areas with scaling and eczema eczema  Data Reviewed: CBC: Recent Labs  Lab 07/17/18 0651  07/19/18 0233 07/20/18 0315 07/21/18 0320 07/22/18 0437 07/23/18 0252  WBC 9.5   < > 6.5 6.8 6.9 6.5 6.7  NEUTROABS 8.2*  --   --  5.0  --   --   --   HGB 14.4   < > 12.2 11.4* 11.6* 12.0 11.5*  HCT 42.9   < > 36.4 35.1* 35.8* 35.3* 35.4*  MCV 85.5   < > 87.3 86.0 86.1 85.3 85.5  PLT 183   < > 120* 136* 173 194 242   < > = values in this interval not displayed.   Basic Metabolic Panel: Recent Labs  Lab 07/17/18 1539  07/19/18 0233 07/20/18 0315 07/21/18 0320 07/22/18 0437 07/23/18 0252  NA  --    < > 133* 131*  132* 129* 133*  K  --    < > 3.8 3.2* 3.0* 3.1* 3.8  CL  --    < > 101 100 98 96* 101  CO2  --    < > 21* 21* 21* 22 22  GLUCOSE  --    < > 103* 104* 103* 113* 116*  BUN  --    < > <5* <5* 5* 6 <5*  CREATININE  --    < > 0.47 0.49 0.51 0.42* 0.41*  CALCIUM  --    < > 7.5* 7.5* 7.6* 7.5* 7.6*  MG 1.4*  --  2.0 1.6*  --   --   --   PHOS 4.4  --   --   --   --   --   --    < > = values in this interval not displayed.    Liver Function Tests: Recent Labs  Lab 07/18/18 0351 07/19/18 0233 07/20/18 0315 07/21/18 0320 07/23/18 0252  AST 142* 135* 124* 143* 140*  ALT 27 32 32 37 41  ALKPHOS 89 96 102 106 107  BILITOT 2.6* 2.8* 2.8* 2.8* 2.4*  PROT 7.2 7.4 7.4 7.5 7.2  ALBUMIN 3.1* 3.1* 3.0* 3.0* 2.8*   No results for input(s): LIPASE, AMYLASE in the last 168 hours. No results for input(s): AMMONIA in the last 168 hours. Coagulation Profile: Recent Labs  Lab 07/19/18 0233 07/20/18 0315  INR 1.15 1.16   Cardiac Enzymes: No results for input(s): CKTOTAL, CKMB, CKMBINDEX, TROPONINI in the last 168 hours. BNP (last 3 results) No results for input(s): PROBNP in the last 8760 hours. CBG: Recent Labs  Lab 07/22/18 1139 07/22/18 1652 07/22/18 2204 07/23/18 0726 07/23/18 1134  GLUCAP 135* 101* 119* 101* 132*   Studies: No results found.   Time spent: 25 minutes  Author: Zannie CovePreetha Lillien Petronio, MD  07/23/2018 11:43 AM  Between 7PM-7AM, please contact night-coverage at [email or web address], password [email or web address]

## 2018-07-24 LAB — CBC
HCT: 35.5 % — ABNORMAL LOW (ref 36.0–46.0)
Hemoglobin: 12 g/dL (ref 12.0–15.0)
MCH: 29.1 pg (ref 26.0–34.0)
MCHC: 33.8 g/dL (ref 30.0–36.0)
MCV: 86 fL (ref 80.0–100.0)
Platelets: 358 10*3/uL (ref 150–400)
RBC: 4.13 MIL/uL (ref 3.87–5.11)
RDW: 13.9 % (ref 11.5–15.5)
WBC: 8.4 10*3/uL (ref 4.0–10.5)
nRBC: 0 % (ref 0.0–0.2)

## 2018-07-24 LAB — BASIC METABOLIC PANEL
Anion gap: 10 (ref 5–15)
BUN: 5 mg/dL — ABNORMAL LOW (ref 6–20)
CO2: 22 mmol/L (ref 22–32)
Calcium: 8.1 mg/dL — ABNORMAL LOW (ref 8.9–10.3)
Chloride: 99 mmol/L (ref 98–111)
Creatinine, Ser: 0.41 mg/dL — ABNORMAL LOW (ref 0.44–1.00)
GFR calc Af Amer: >60 mL/min (ref >60)
GFR calc non Af Amer: >60 mL/min (ref >60)
GLUCOSE: 112 mg/dL — AB (ref 70–99)
Potassium: 3.3 mmol/L — ABNORMAL LOW (ref 3.5–5.1)
Sodium: 131 mmol/L — ABNORMAL LOW (ref 135–145)

## 2018-07-24 LAB — GLUCOSE, CAPILLARY
Glucose-Capillary: 106 mg/dL — ABNORMAL HIGH (ref 70–99)
Glucose-Capillary: 111 mg/dL — ABNORMAL HIGH (ref 70–99)
Glucose-Capillary: 114 mg/dL — ABNORMAL HIGH (ref 70–99)
Glucose-Capillary: 121 mg/dL — ABNORMAL HIGH (ref 70–99)

## 2018-07-24 MED ORDER — POTASSIUM CHLORIDE CRYS ER 20 MEQ PO TBCR
40.0000 meq | EXTENDED_RELEASE_TABLET | Freq: Once | ORAL | Status: AC
  Administered 2018-07-24: 40 meq via ORAL
  Filled 2018-07-24: qty 2

## 2018-07-24 NOTE — Progress Notes (Signed)
Patient ID: Angela Villegas, female   DOB: [birth year], 28 y.o.   MRN: [id number]          [facility]    Date of Admission:  07/17/2018   Day 4 vancomycin         She has defervesced and is improving on empiric vancomycin for IV associated left antecubital vein septic thrombophlebitis.  I recommend continuing vancomycin 1-2 more days through her upcoming right ankle surgery.  I will sign off now.         Cliffton Asters, MD [facility] [phone] pager   [phone] cell 07/24/2018, 1:55 PM

## 2018-07-24 NOTE — Plan of Care (Signed)
Pt with good appetite.  

## 2018-07-24 NOTE — Progress Notes (Signed)
Pharmacy Antibiotic Note  Angela Villegas is a 28 y.o. female admitted on 07/17/2018 with concern for septic thrombophlebitis. Pharmacy consulted for Vancomycin dosing.  Vancomycin 1250 mg IV Q 24 hrs. Goal AUC 400-550. Expected AUC: 483.5 SCr used: 0.8  Plan: - Continue Vancomycin 1250 mg q24h - Will continue to follow renal function, culture results, LOT, and antibiotic de-escalation plans   Height: 5\' 1"  (154.9 cm) Weight: 162 lb 14.7 oz (73.9 kg) IBW/kg (Calculated) : 47.8  Temp (24hrs), Avg:98.4 F (36.9 C), Min:97.8 F (36.6 C), Max:98.8 F (37.1 C)  Recent Labs  Lab 07/20/18 0315 07/21/18 0320 07/22/18 0437 07/23/18 0252 07/24/18 0253  WBC 6.8 6.9 6.5 6.7 8.4  CREATININE 0.49 0.51 0.42* 0.41* 0.41*    Estimated Creatinine Clearance: 96.2 mL/min (A) (by C-G formula based on SCr of 0.41 mg/dL (L)).    Allergies  Allergen Reactions  . Vicodin [Hydrocodone-Acetaminophen] Itching and Nausea Only   Thank you for allowing pharmacy to be a part of this patient's care.  Georgina Pillion, PharmD, BCPS Clinical Pharmacist Clinical phone for 07/24/2018: [phone] 07/24/2018 12:18 PM   **Pharmacist phone directory can now be found on [email or web address] ([email or web address]).  Listed under [facility].

## 2018-07-24 NOTE — Progress Notes (Signed)
CSW acknowledges Substance Abuse Consult. CSW attempted to complete one with the patient, the patient declined and asked the CSW to come back later.   CSW will try again later and will continue to follow.   Drucilla Schmidt, MSW, LCSW-A Clinical Social Worker [employer]

## 2018-07-24 NOTE — Progress Notes (Signed)
[facility] Progress Note  Patient: Angela Villegas ZOX:[id number]   PCP: Shirlean MylarWebb, Carol, MD DOB: Feb 10, 1991   DOA: 07/17/2018   DOS: 07/24/2018   Date of Service: the patient was seen and examined on 07/24/2018  Brief hospital course: Pt. with PMH of cirrhosis, alcohol abuse and withdrawal, ADD, and T2DM ; admitted on 07/17/2018, presented with complaint of seizure, was found to have alcohol withdrawal induced seizure along with right ankle fracture and tongue laceration.  She typically drinks 1/2 liter of vodka a day, last drink was 1 07/16/2018.  2 seizures at home, 1 seizure here in the ER.  Started on telemetry admitted to ICU,.  [facility] -Plan was for ORIF 1/8 however has been febrile since, surgery deferred -1/9 ID consulted, started on IV vancomycin for possible thrombophlebitis  Subjective:  -Improving, no further fevers, no withdrawal symptoms  Assessment and Plan:  Alcohol withdrawal seizures  -Was admitted to the ICU on Precedex -improved from this standpoint, she was started on 500 mg twice daily Keppra admission, I discussed with neurology tapered off Keppra -Withdrawals have resolved, cutdown Ativan, continue thiamine -No driving/operating machinery etc for atleast 6months  Trimalleolar right ankle fracture -Ortho following, plan for ORIF, surgery postponed due to fevers  -Now afebrile for 48 hours, plan for ORIF Monday will make n.p.o. after midnight   Fever/thrombophlebitis of left antecubital vein -Greatly appreciate ID consult, she started IV vancomycin 1/9 -Blood cultures are negative, UA was unremarkable however urine cultures grew enterococcus faecalis, she is asymptomatic from a urinary standpoint I suspect this is colonization -Dopplers negative for DVT, no ascites noted on abdominal ultrasound so could not have SBP -Improving on IV vancomycin, will likely transition to oral doxycycline post op for few more days   Alcohol induced  cirrhosis. -Stable, restarted Inderal at home dose of 20 mg daily  -No longer on diuretics, clinically appears euvolemic   Tongue laceration. PCCM discussed with ENT, recommend oral and topical pain medication. SLP evaluation currently recommending full liquid diet secondary to difficulty swallowing secondary to pain.  Type 2 Diabetes Mellitus, uncontroled without any complication Hemoglobin A1c was 6.4 , compared to 11 in 2019, -Home medications held, continue sliding scale insulin  Obesity Body mass index is 30.78 kg/m.  Dietary consultation. Currently unable to eat anything due to tongue laceration.  Urinary retention. -resolved, Foley catheter discontinued  DVT Prophylaxis: subcutaneous Heparin  Advance goals of care discussion: full code  Family Communication: Spouse at bedside  Disposition: Home pending ORIF and resolution of fevers  Consultants: PCCM, orthopedics  Procedures: none  Scheduled Meds: . docusate sodium  100 mg Oral BID  . enoxaparin (LOVENOX) injection  40 mg Subcutaneous Q24H  . fluocinonide cream   Topical BID  . folic acid  1 mg Oral Daily  . insulin aspart  0-9 Units Subcutaneous TID WC  . LORazepam  2 mg Intravenous Once  . magic mouthwash w/lidocaine  15 mL Oral QID  . mouth rinse  15 mL Mouth Rinse BID  . multivitamin with minerals  1 tablet Oral Daily  . polyethylene glycol  17 g Oral Daily  . potassium chloride  40 mEq Oral Once  . propranolol  20 mg Oral Daily  . sertraline  100 mg Oral Daily  . thiamine  100 mg Oral Daily   Continuous Infusions: . vancomycin 1,250 mg (07/23/18 2225)   PRN Meds: HYDROcodone-acetaminophen, LORazepam, magic mouthwash w/lidocaine, morphine injection, ondansetron (ZOFRAN) IV, phenol, pneumococcal 23 valent vaccine  Antibiotics: Anti-infectives (From admission, onward)   Start     Dose/Rate Route Frequency Ordered Stop   07/22/18 2000  vancomycin (VANCOCIN) 1,250 mg in sodium chloride 0.9 % 250 mL IVPB      1,250 mg 166.7 mL/hr over 90 Minutes Intravenous Every 24 hours 07/21/18 1837     07/21/18 2000  vancomycin (VANCOCIN) 1,750 mg in sodium chloride 0.9 % 500 mL IVPB     1,750 mg 250 mL/hr over 120 Minutes Intravenous  Once 07/21/18 1837 07/22/18 0011       Objective: Physical Exam: Vitals:   07/23/18 2126 07/24/18 0045 07/24/18 0807 07/24/18 0944  BP: 110/77 113/81 113/79   Pulse: 80 73 76 (!) 106  Resp: 17 16    Temp: 98.7 F (37.1 C) 97.8 F (36.6 C) 98.8 F (37.1 C)   TempSrc: Oral Oral Oral   SpO2: 98% 97% 100% 96%  Weight:      Height:        Intake/Output Summary (Last 24 hours) at 07/24/2018 1114 Last data filed at 07/23/2018 2300 Gross per 24 hour  Intake 490 ml  Output -  Net 490 ml   Filed Weights   07/19/18 0441 07/20/18 0405 07/22/18 0634  Weight: 73.7 kg 73.7 kg 73.9 kg   Gen: Awake, Alert, Oriented X 3, obese, chronically ill female, no distress HEENT: PERRLA, Neck supple, no JVD Lungs: Good air movement bilaterally, CTAB CVS: RRR,No Gallops,Rubs or new Murmurs Abd: soft, Non tender, non distended, BS present Extremities: Left upper extremity antecubital vein with small area of swelling redness and tenderness-notably better, right foot with swelling and dressing, some edema and ecchymosis noted Skin: Bilateral postauricular areas with scaling and eczema eczema  Data Reviewed: CBC: Recent Labs  Lab 07/20/18 0315 07/21/18 0320 07/22/18 0437 07/23/18 0252 07/24/18 0253  WBC 6.8 6.9 6.5 6.7 8.4  NEUTROABS 5.0  --   --   --   --   HGB 11.4* 11.6* 12.0 11.5* 12.0  HCT 35.1* 35.8* 35.3* 35.4* 35.5*  MCV 86.0 86.1 85.3 85.5 86.0  PLT 136* 173 194 242 358   Basic Metabolic Panel: Recent Labs  Lab 07/17/18 1539  07/19/18 0233 07/20/18 0315 07/21/18 0320 07/22/18 0437 07/23/18 0252 07/24/18 0253  NA  --    < > 133* 131* 132* 129* 133* 131*  K  --    < > 3.8 3.2* 3.0* 3.1* 3.8 3.3*  CL  --    < > 101 100 98 96* 101 99  CO2  --    < > 21* 21*  21* 22 22 22   GLUCOSE  --    < > 103* 104* 103* 113* 116* 112*  BUN  --    < > <5* <5* 5* 6 <5* 5*  CREATININE  --    < > 0.47 0.49 0.51 0.42* 0.41* 0.41*  CALCIUM  --    < > 7.5* 7.5* 7.6* 7.5* 7.6* 8.1*  MG 1.4*  --  2.0 1.6*  --   --   --   --   PHOS 4.4  --   --   --   --   --   --   --    < > = values in this interval not displayed.    Liver Function Tests: Recent Labs  Lab 07/18/18 0351 07/19/18 0233 07/20/18 0315 07/21/18 0320 07/23/18 0252  AST 142* 135* 124* 143* 140*  ALT 27 32 32 37 41  ALKPHOS 89 96  102 106 107  BILITOT 2.6* 2.8* 2.8* 2.8* 2.4*  PROT 7.2 7.4 7.4 7.5 7.2  ALBUMIN 3.1* 3.1* 3.0* 3.0* 2.8*   No results for input(s): LIPASE, AMYLASE in the last 168 hours. No results for input(s): AMMONIA in the last 168 hours. Coagulation Profile: Recent Labs  Lab 07/19/18 0233 07/20/18 0315  INR 1.15 1.16   Cardiac Enzymes: No results for input(s): CKTOTAL, CKMB, CKMBINDEX, TROPONINI in the last 168 hours. BNP (last 3 results) No results for input(s): PROBNP in the last 8760 hours. CBG: Recent Labs  Lab 07/23/18 0726 07/23/18 1134 07/23/18 1704 07/23/18 2127 07/24/18 0756  GLUCAP 101* 132* 106* 114* 106*   Studies: No results found.   Time spent: 25 minutes  Author: Zannie Cove, MD  07/24/2018 11:14 AM  Between 7PM-7AM, please contact night-coverage at [email or web address], password [facility]

## 2018-07-25 ENCOUNTER — Encounter (HOSPITAL_COMMUNITY): Payer: Self-pay | Admitting: Anesthesiology

## 2018-07-25 ENCOUNTER — Inpatient Hospital Stay (HOSPITAL_COMMUNITY): Payer: BC Managed Care – PPO

## 2018-07-25 ENCOUNTER — Encounter (HOSPITAL_COMMUNITY)
Admission: EM | Disposition: A | Discharge status: Home Health | Payer: Self-pay | Source: Home / Self Care | Attending: Internal Medicine

## 2018-07-25 ENCOUNTER — Inpatient Hospital Stay (HOSPITAL_COMMUNITY): Payer: BC Managed Care – PPO | Admitting: Certified Registered Nurse Anesthetist

## 2018-07-25 HISTORY — PX: ORIF ANKLE FRACTURE: SHX5408

## 2018-07-25 LAB — CBC
HCT: 38.3 % (ref 36.0–46.0)
Hemoglobin: 12.4 g/dL (ref 12.0–15.0)
MCH: 27.8 pg (ref 26.0–34.0)
MCHC: 32.4 g/dL (ref 30.0–36.0)
MCV: 85.9 fL (ref 80.0–100.0)
NRBC: 0 % (ref 0.0–0.2)
Platelets: 389 10*3/uL (ref 150–400)
RBC: 4.46 MIL/uL (ref 3.87–5.11)
RDW: 13.8 % (ref 11.5–15.5)
WBC: 10.2 10*3/uL (ref 4.0–10.5)

## 2018-07-25 LAB — BASIC METABOLIC PANEL
Anion gap: 9 (ref 5–15)
BUN: 5 mg/dL — ABNORMAL LOW (ref 6–20)
CO2: 23 mmol/L (ref 22–32)
Calcium: 8.3 mg/dL — ABNORMAL LOW (ref 8.9–10.3)
Chloride: 99 mmol/L (ref 98–111)
Creatinine, Ser: 0.41 mg/dL — ABNORMAL LOW (ref 0.44–1.00)
GFR calc non Af Amer: >60 mL/min (ref >60)
Glucose, Bld: 108 mg/dL — ABNORMAL HIGH (ref 70–99)
Potassium: 3.9 mmol/L (ref 3.5–5.1)
Sodium: 131 mmol/L — ABNORMAL LOW (ref 135–145)

## 2018-07-25 LAB — CULTURE, BLOOD (ROUTINE X 2)
Culture: NO GROWTH
Culture: NO GROWTH
Special Requests: ADEQUATE
Special Requests: ADEQUATE

## 2018-07-25 LAB — GLUCOSE, CAPILLARY
GLUCOSE-CAPILLARY: 296 mg/dL — AB (ref 70–99)
GLUCOSE-CAPILLARY: 161 mg/dL — AB (ref 70–99)
Glucose-Capillary: 105 mg/dL — ABNORMAL HIGH (ref 70–99)
Glucose-Capillary: 97 mg/dL (ref 70–99)
Glucose-Capillary: 119 mg/dL — ABNORMAL HIGH (ref 70–99)

## 2018-07-25 SURGERY — OPEN REDUCTION INTERNAL FIXATION (ORIF) ANKLE FRACTURE
Anesthesia: Spinal | Site: Ankle | Laterality: Right

## 2018-07-25 MED ORDER — MIDAZOLAM HCL 2 MG/2ML IJ SOLN
INTRAMUSCULAR | Status: AC
  Filled 2018-07-25: qty 2

## 2018-07-25 MED ORDER — VANCOMYCIN HCL 1000 MG IV SOLR
INTRAVENOUS | Status: AC
  Filled 2018-07-25: qty 1000

## 2018-07-25 MED ORDER — LACTATED RINGERS IV SOLN
INTRAVENOUS | Status: DC
  Administered 2018-07-25 (×2): via INTRAVENOUS

## 2018-07-25 MED ORDER — LIDOCAINE 2% (20 MG/ML) 5 ML SYRINGE
INTRAMUSCULAR | Status: AC
  Filled 2018-07-25: qty 5

## 2018-07-25 MED ORDER — CEFAZOLIN SODIUM-DEXTROSE 2-4 GM/100ML-% IV SOLN
2.0000 g | INTRAVENOUS | Status: AC
  Administered 2018-07-25: 2 g via INTRAVENOUS
  Filled 2018-07-25 (×2): qty 100

## 2018-07-25 MED ORDER — SODIUM CHLORIDE 0.9 % IV SOLN
INTRAVENOUS | Status: DC | PRN
  Administered 2018-07-25: 20 ug/min via INTRAVENOUS

## 2018-07-25 MED ORDER — ACETAMINOPHEN 500 MG PO TABS: 1000.0000 mg | ORAL_TABLET | Freq: Three times a day (TID) | ORAL | 0 refills | Status: DC

## 2018-07-25 MED ORDER — OXYCODONE HCL 5 MG PO TABS: 5.0000 mg | ORAL_TABLET | Freq: Four times a day (QID) | ORAL | Status: DC | PRN

## 2018-07-25 MED ORDER — ONDANSETRON HCL 4 MG/2ML IJ SOLN
INTRAMUSCULAR | Status: AC
  Filled 2018-07-25: qty 2

## 2018-07-25 MED ORDER — FENTANYL CITRATE (PF) 100 MCG/2ML IJ SOLN
25.0000 ug | INTRAMUSCULAR | Status: DC | PRN
  Administered 2018-07-25: 50 ug via INTRAVENOUS

## 2018-07-25 MED ORDER — LIDOCAINE 2% (20 MG/ML) 5 ML SYRINGE
INTRAMUSCULAR | Status: DC | PRN
  Administered 2018-07-25: 60 mg via INTRAVENOUS

## 2018-07-25 MED ORDER — DEXAMETHASONE SODIUM PHOSPHATE 10 MG/ML IJ SOLN
INTRAMUSCULAR | Status: AC
  Filled 2018-07-25: qty 1

## 2018-07-25 MED ORDER — FENTANYL CITRATE (PF) 250 MCG/5ML IJ SOLN
INTRAMUSCULAR | Status: AC
  Filled 2018-07-25: qty 5

## 2018-07-25 MED ORDER — MELOXICAM 7.5 MG PO TABS: 7.5000 mg | ORAL_TABLET | Freq: Every day | ORAL | 2 refills | Status: DC

## 2018-07-25 MED ORDER — FENTANYL CITRATE (PF) 100 MCG/2ML IJ SOLN
50.0000 ug | Freq: Once | INTRAMUSCULAR | Status: AC
  Administered 2018-07-25: 50 ug via INTRAVENOUS

## 2018-07-25 MED ORDER — 0.9 % SODIUM CHLORIDE (POUR BTL) OPTIME
TOPICAL | Status: DC | PRN
  Administered 2018-07-25: 1000 mL

## 2018-07-25 MED ORDER — ROCURONIUM BROMIDE 50 MG/5ML IV SOSY
PREFILLED_SYRINGE | INTRAVENOUS | Status: AC
  Filled 2018-07-25: qty 5

## 2018-07-25 MED ORDER — CEFAZOLIN SODIUM-DEXTROSE 2-4 GM/100ML-% IV SOLN
2.0000 g | Freq: Three times a day (TID) | INTRAVENOUS | Status: AC
  Administered 2018-07-25 – 2018-07-26 (×2): 2 g via INTRAVENOUS
  Filled 2018-07-25 (×2): qty 100

## 2018-07-25 MED ORDER — OXYCODONE HCL 5 MG PO TABS: ORAL_TABLET | ORAL | 0 refills | Status: AC

## 2018-07-25 MED ORDER — MIDAZOLAM HCL 2 MG/2ML IJ SOLN
INTRAMUSCULAR | Status: AC
  Administered 2018-07-25: 2 mg via INTRAVENOUS
  Filled 2018-07-25: qty 2

## 2018-07-25 MED ORDER — VANCOMYCIN HCL 1000 MG IV SOLR
INTRAVENOUS | Status: DC | PRN
  Administered 2018-07-25: 1000 mg

## 2018-07-25 MED ORDER — TOBRAMYCIN SULFATE 1.2 G IJ SOLR
INTRAMUSCULAR | Status: AC
  Filled 2018-07-25: qty 1.2

## 2018-07-25 MED ORDER — DEXAMETHASONE SODIUM PHOSPHATE 10 MG/ML IJ SOLN
INTRAMUSCULAR | Status: DC | PRN
  Administered 2018-07-25: 4 mg via INTRAVENOUS

## 2018-07-25 MED ORDER — OMEPRAZOLE 20 MG PO CPDR: 20.0000 mg | DELAYED_RELEASE_CAPSULE | Freq: Every day | ORAL | 0 refills | Status: AC

## 2018-07-25 MED ORDER — PROPOFOL 500 MG/50ML IV EMUL
INTRAVENOUS | Status: DC | PRN
  Administered 2018-07-25: 50 ug/kg/min via INTRAVENOUS

## 2018-07-25 MED ORDER — FENTANYL CITRATE (PF) 100 MCG/2ML IJ SOLN
INTRAMUSCULAR | Status: AC
  Administered 2018-07-25: 50 ug via INTRAVENOUS
  Filled 2018-07-25: qty 2

## 2018-07-25 MED ORDER — PROPOFOL 10 MG/ML IV BOLUS
INTRAVENOUS | Status: AC
  Filled 2018-07-25: qty 20

## 2018-07-25 MED ORDER — MIDAZOLAM HCL 2 MG/2ML IJ SOLN
2.0000 mg | Freq: Once | INTRAMUSCULAR | Status: AC
  Administered 2018-07-25: 2 mg via INTRAVENOUS

## 2018-07-25 MED ORDER — TOBRAMYCIN SULFATE 1.2 G IJ SOLR
INTRAMUSCULAR | Status: DC | PRN
  Administered 2018-07-25: 1.2 g

## 2018-07-25 MED ORDER — PROPOFOL 10 MG/ML IV BOLUS
INTRAVENOUS | Status: DC | PRN
  Administered 2018-07-25: 200 mg via INTRAVENOUS

## 2018-07-25 MED ORDER — ONDANSETRON HCL 4 MG/2ML IJ SOLN: 4.0000 mg | Freq: Once | INTRAMUSCULAR | Status: DC | PRN

## 2018-07-25 SURGICAL SUPPLY — 77 items
BANDAGE ACE 4X5 VEL STRL LF (GAUZE/BANDAGES/DRESSINGS) IMPLANT
BANDAGE ACE 6X5 VEL STRL LF (GAUZE/BANDAGES/DRESSINGS) IMPLANT
BANDAGE ELASTIC 4 VELCRO ST LF (GAUZE/BANDAGES/DRESSINGS) ×4 IMPLANT
BANDAGE ESMARK 6X9 LF (GAUZE/BANDAGES/DRESSINGS) ×1 IMPLANT
BIT DRILL 2 CANN GRADUATED (BIT) ×2 IMPLANT
BIT DRILL 2.5 CANN STRL (BIT) ×2 IMPLANT
BIT DRILL 2.6 CANN (BIT) ×2 IMPLANT
BIT DRILL 2.7 (BIT) ×2
BIT DRILL 2.7X2.7/3XSCR ANKL (BIT) IMPLANT
BIT DRL 2.7X2.7/3XSCR ANKL (BIT) ×1
BNDG COHESIVE 4X5 TAN STRL (GAUZE/BANDAGES/DRESSINGS) ×3 IMPLANT
BNDG ESMARK 6X9 LF (GAUZE/BANDAGES/DRESSINGS) ×3
BRUSH SCRUB SURG 4.25 DISP (MISCELLANEOUS) ×3 IMPLANT
CHLORAPREP W/TINT 26ML (MISCELLANEOUS) ×3 IMPLANT
COVER MAYO STAND STRL (DRAPES) ×3 IMPLANT
COVER SURGICAL LIGHT HANDLE (MISCELLANEOUS) ×3 IMPLANT
COVER WAND RF STERILE (DRAPES) ×3 IMPLANT
DRAPE HALF SHEET 40X57 (DRAPES) ×6 IMPLANT
DRAPE OEC MINIVIEW 54X84 (DRAPES) ×3 IMPLANT
DRAPE ORTHO SPLIT 77X108 STRL (DRAPES) ×4
DRAPE SURG ORHT 6 SPLT 77X108 (DRAPES) ×2 IMPLANT
DRAPE U-SHAPE 47X51 STRL (DRAPES) ×3 IMPLANT
DRSG ADAPTIC 3X8 NADH LF (GAUZE/BANDAGES/DRESSINGS) IMPLANT
ELECT REM PT RETURN 9FT ADLT (ELECTROSURGICAL) ×3
ELECTRODE REM PT RTRN 9FT ADLT (ELECTROSURGICAL) ×1 IMPLANT
GAUZE SPONGE 4X4 12PLY STRL (GAUZE/BANDAGES/DRESSINGS) IMPLANT
GAUZE SPONGE 4X4 12PLY STRL LF (GAUZE/BANDAGES/DRESSINGS) ×2 IMPLANT
GAUZE XEROFORM 5X9 LF (GAUZE/BANDAGES/DRESSINGS) ×2 IMPLANT
GLOVE BIOGEL PI IND STRL 8 (GLOVE) ×1 IMPLANT
GLOVE BIOGEL PI INDICATOR 8 (GLOVE) ×2
GLOVE ECLIPSE 8.0 STRL XLNG CF (GLOVE) ×3 IMPLANT
GOWN STRL REUS W/ TWL LRG LVL3 (GOWN DISPOSABLE) ×2 IMPLANT
GOWN STRL REUS W/TWL LRG LVL3 (GOWN DISPOSABLE) ×4
GUIDEWIRE 1.35MM (WIRE) ×4 IMPLANT
KIT TURNOVER KIT B (KITS) ×3 IMPLANT
MANIFOLD NEPTUNE II (INSTRUMENTS) ×3 IMPLANT
NDL HYPO 21X1.5 SAFETY (NEEDLE) IMPLANT
NEEDLE 25GAX1.5 (MISCELLANEOUS) ×3 IMPLANT
NEEDLE HYPO 21X1.5 SAFETY (NEEDLE) IMPLANT
NS IRRIG 1000ML POUR BTL (IV SOLUTION) ×3 IMPLANT
PACK TOTAL JOINT (CUSTOM PROCEDURE TRAY) ×3 IMPLANT
PAD ABD 8X10 STRL (GAUZE/BANDAGES/DRESSINGS) ×4 IMPLANT
PAD ARMBOARD 7.5X6 YLW CONV (MISCELLANEOUS) ×6 IMPLANT
PAD CAST 4YDX4 CTTN HI CHSV (CAST SUPPLIES) IMPLANT
PADDING CAST COTTON 4X4 STRL (CAST SUPPLIES) ×6
PADDING CAST COTTON 6X4 STRL (CAST SUPPLIES) IMPLANT
PADDING CAST SYNTHETIC 4 (CAST SUPPLIES) ×2
PADDING CAST SYNTHETIC 4X4 STR (CAST SUPPLIES) IMPLANT
PLATE THIRD TUBULAR 7 HOLE (Plate) ×2 IMPLANT
SCREW LO PRO 2.7X22MM CORTEX (Screw) ×2 IMPLANT
SCREW LOCK T15 FT 10X3.5XST (Screw) IMPLANT
SCREW LOCK T15 FT 16X3.5XST (Screw) IMPLANT
SCREW LOCK T15 FT 18X3.5XST (Screw) IMPLANT
SCREW LOCKING 3.5X10 (Screw) ×2 IMPLANT
SCREW LOCKING 3.5X16MM (Screw) ×2 IMPLANT
SCREW LOCKING 3.5X18 (Screw) ×2 IMPLANT
SCREW LOW PROFILE 3.5X14 (Screw) ×6 IMPLANT
SCREW LP CANN 4.0X45MM (Screw) ×4 IMPLANT
SPLINT PLASTER CAST XFAST 5X30 (CAST SUPPLIES) IMPLANT
SPLINT PLASTER XFAST SET 5X30 (CAST SUPPLIES) ×2
SPONGE LAP 18X18 X RAY DECT (DISPOSABLE) IMPLANT
STAPLER VISISTAT 35W (STAPLE) ×3 IMPLANT
SUCTION FRAZIER HANDLE 10FR (MISCELLANEOUS) ×2
SUCTION TUBE FRAZIER 10FR DISP (MISCELLANEOUS) ×1 IMPLANT
SUT ETHILON 3 0 PS 1 (SUTURE) ×6 IMPLANT
SUT PROLENE 0 CT (SUTURE) IMPLANT
SUT VIC AB 0 CT1 27 (SUTURE) ×2
SUT VIC AB 0 CT1 27XBRD ANBCTR (SUTURE) ×1 IMPLANT
SUT VIC AB 2-0 CT1 27 (SUTURE) ×4
SUT VIC AB 2-0 CT1 TAPERPNT 27 (SUTURE) ×2 IMPLANT
SYR CONTROL 10ML LL (SYRINGE) ×3 IMPLANT
TOWEL OR 17X24 6PK STRL BLUE (TOWEL DISPOSABLE) ×3 IMPLANT
TOWEL OR 17X26 10 PK STRL BLUE (TOWEL DISPOSABLE) ×6 IMPLANT
TUBE CONNECTING 12'X1/4 (SUCTIONS) ×1
TUBE CONNECTING 12X1/4 (SUCTIONS) ×2 IMPLANT
UNDERPAD 30X30 (UNDERPADS AND DIAPERS) ×3 IMPLANT
WATER STERILE IRR 1000ML POUR (IV SOLUTION) ×3 IMPLANT

## 2018-07-25 NOTE — Anesthesia Procedure Notes (Signed)
Procedure Name: LMA Insertion Performed by: Saory Carriero H, CRNA Pre-anesthesia Checklist: Patient identified, Emergency Drugs available, Suction available and Patient being monitored Patient Re-evaluated:Patient Re-evaluated prior to induction Oxygen Delivery Method: Circle System Utilized Preoxygenation: Pre-oxygenation with 100% oxygen Induction Type: IV induction Ventilation: Mask ventilation without difficulty LMA: LMA with gastric port inserted LMA Size: 4.0 Number of attempts: 1 Airway Equipment and Method: Bite block Placement Confirmation: positive ETCO2 Tube secured with: Tape Dental Injury: Teeth and Oropharynx as per pre-operative assessment        

## 2018-07-25 NOTE — Anesthesia Preprocedure Evaluation (Addendum)
Anesthesia Evaluation  Patient identified by MRN, date of birth, ID band Patient awake    Reviewed: Allergy & Precautions, NPO status , Patient's Chart, lab work & pertinent test results  Airway Mallampati: III  TM Distance: >3 FB Neck ROM: Full    Dental no notable dental hx. (+) Teeth Intact   Pulmonary asthma , Current Smoker,    Pulmonary exam normal breath sounds clear to auscultation       Cardiovascular hypertension, Normal cardiovascular exam Rhythm:Regular Rate:Normal  Hx/o septic thrombophlebitis- on Vancomycin   Neuro/Psych Seizures -,  PSYCHIATRIC DISORDERS ADHDHx/o ETOH withdrawal seizures    GI/Hepatic negative GI ROS, (+) Cirrhosis     substance abuse  alcohol use,   Endo/Other  diabetes, Well Controlled, Type 2, Insulin DependentObesity  Renal/GU   negative genitourinary   Musculoskeletal Trimalleolar Fx right ankle Eczema   Abdominal (+) + obese,   Peds  Hematology negative hematology ROS (+)   Anesthesia Other Findings   Reproductive/Obstetrics                             Anesthesia Physical Anesthesia Plan  ASA: III  Anesthesia Plan: General   Post-op Pain Management:  Regional for Post-op pain   Induction: Intravenous  PONV Risk Score and Plan: 2 and Ondansetron, Propofol infusion and Treatment may vary due to age or medical condition  Airway Management Planned: LMA  Additional Equipment:   Intra-op Plan:   Post-operative Plan:   Informed Consent: I have reviewed the patients History and Physical, chart, labs and discussed the procedure including the risks, benefits and alternatives for the proposed anesthesia with the patient or authorized representative who has indicated his/her understanding and acceptance.   Dental advisory given  Plan Discussed with: CRNA, Surgeon and Anesthesiologist  Anesthesia Plan Comments:        Anesthesia Quick  Evaluation

## 2018-07-25 NOTE — Interval H&P Note (Signed)
Talked about risks and benefits again.  Plan for ORIF of ankle today.  Specific risks of non-union, malunion and poor wound healing and infection discussed again

## 2018-07-25 NOTE — Anesthesia Postprocedure Evaluation (Signed)
Anesthesia Post Note  Patient: Angela Villegas  Procedure(s) Performed: OPEN REDUCTION INTERNAL FIXATION (ORIF) ANKLE FRACTURE (Right Ankle)     Patient location during evaluation: PACU Anesthesia Type: Regional and General Level of consciousness: awake and alert Pain management: pain level controlled Vital Signs Assessment: post-procedure vital signs reviewed and stable Respiratory status: spontaneous breathing, nonlabored ventilation, respiratory function stable and patient connected to nasal cannula oxygen Cardiovascular status: blood pressure returned to baseline and stable Postop Assessment: no apparent nausea or vomiting Anesthetic complications: no    Last Vitals:  Vitals:   07/25/18 1405 07/25/18 1420  BP: (!) 125/91 120/86  Pulse: 76 75  Resp: 14 14  Temp:  36.6 C  SpO2: 98% 97%    Last Pain:  Vitals:   07/25/18 1405  TempSrc:   PainSc: 3                  Cordae Mccarey COKER

## 2018-07-25 NOTE — Op Note (Signed)
Orthopaedic Surgery Operative Note (CSN: [id number])  Angela Furnacereeta Bevan  [birth year] Date of Surgery: 07/17/2018 - 07/25/2018   Diagnoses:  Right displaced trimalleolar ankle fracture  Procedure: ORIF trimalleolar ankle fracture without fixation of posterior lip   Operative Finding Successful completion of planned procedure.  Reasonable bone quality though less than would be expected at the patient's age.  She had significant fracture blisters both medially and laterally but we were able to adjust her incisions to avoid these.  She also had a fracture blister on the dorsum of her foot.  These things make her risk of surgery infection higher however due to the timing we felt that she still benefit from ORIF rather than external fixation.  Post-operative plan: The patient will be nonweightbearing in a splint with transition to a cast with sutures removed at 4 to 5 weeks.  The patient will be readmitted to the medicine service but appropriate for discharge at their discretion as this would otherwise be outpatient surgery from orthopedic perspective.  DVT prophylaxis per primary team, from orthopedic perspective aspirin would be appropriate but could be more aggressive with DVT prophylaxis medicine feels this is appropriate.  Pain control with PRN pain medication preferring oral medicines.  Follow up plan will be scheduled in approximately 14 days for incision check and XR.  Post-Op Diagnosis: Same Surgeons:Primary: Bjorn PippinVarkey, Rebeca Valdivia T, MD Assistants: Janace LittenBrandon Parry, OPAC [facility] Anesthesia: General Antibiotics: Ancef 2g preop, Vancomycin 1000 mg locally tobramycin 1.2 g Tourniquet time: 1 hour Estimated Blood Loss: Minimal Complications: None Specimens: None Implants: Implant Name Type Inv. Item Serial No. Manufacturer Lot No. LRB No. Used Action  SCREW LO PRO 2.7X22MM CORTEX - [id number] Screw SCREW LO PRO 2.7X22MM CORTEX  [employer]  Right 1 Implanted  PLATE THIRD TUBULAR 7 HOLE -  [id number] Plate PLATE THIRD TUBULAR 7 HOLE  [employer]  Right 1 Implanted  SCREW LOW PROFILE 3.5X14 - [id number] Screw SCREW LOW PROFILE 3.5X14  [employer]  Right 2 Implanted  SCREW LOCKING 3.5X10 - [id number] Screw SCREW LOCKING 3.5X10  [employer]  Right 1 Implanted  SCREW LOCKING 3.5X16MM - [id number] Screw SCREW LOCKING 3.5X16MM  [employer]  Right 1 Implanted  SCREW LP CANN 4.0X45MM - [id number] Screw SCREW LP CANN 4.0X45MM  [employer]  Right 2 Implanted    Indications for Surgery:   Angela Villegas is a 28 y.o. female with alcoholic withdrawal seizures and fracture 8 days ago.  Patient reportedly drank 1/5 of alcohol as well as 6 beers per day.  Due to the patient's medical stability and fevers after her admission we were unable to take her to the operating room on multiple occasions.  Were finally cleared to take her to the operating room.  Benefits and risks of operative and nonoperative management were discussed prior to surgery with patient/guardian(s) and informed consent form was completed.  Specific risks including infection, need for additional surgery, skin issues, wound infection, malunion and nonunion, hardware related pain.  Her specific risk would be increased with fracture blisters and skin issues especially in the setting of her severe alcoholism.   Procedure:   The patient was identified in the preoperative holding area where the surgical site was marked. The patient was taken to the OR where a procedural timeout was called and the above noted anesthesia was induced.  The patient was positioned supine on a regular bed.  Preoperative antibiotics were dosed.  The patient's right ankle was prepped and draped in the usual  sterile fashion.  A second preoperative timeout was called.      A tourniquet was used for the above listed time.   We began with our ORIF of the fibula. A longitudinal approach was made along the lateral border of the fibula centered at the fracture site.  We  centered this incision slightly anterior to avoid posterior fracture blisters.  We dissected down taking care to avoid the superficial peroneal nerve which crossed proximal to our incision. We encountered the fracture site and noted a oblique fracture with anterior comminution of the fibula. The bone quality was moderate but less and we would expect to the patient this age. We are able to reduce it anatomically with the exception of the small anterior comminution which was left intact to avoid disruption. We identified that the fracture was amenable to lag screw fixation.  We then filled to locking holes distal to the fracture site and 3 holes proximal achieving bicortical fixation with all screws.  We confirmed anatomic reduction of fluoroscopy and then turned our attention to the medial side.  An oblique incision was made over the anterior aspect of the medial malleolus were able to identify the fracture site itself. A point the fracture site was cleared of interposed periosteum and we were able to obtain an anatomic reduction was held with point-to-point clamp. At this point we placed 1 partially threaded 45 mm cannulated screw with washers across the fracture site achieving good purchase and good compression with screw.  The piece was not amenable to a second screw due to its size.  There was some anterior comminution here as well but we were able to get good cortical reads and get anatomic reduction of this piece.  Her syndesmosis was somewhat atypical however on multiple external rotation stress views with live fluoroscopy there is no widening.   The incision was thoroughly irrigated and closed in a multilayer fashion with absorbable sutures deep and nylon in the skin. A sterile dressing was placed.  A well molded well-padded short leg splint was placed.  The patient was awoken from general anesthesia and taken to the PACU in stable condition without complication.   Janace LittenBrandon Parry, OPA-C, present and  scrubbed throughout the case, critical for completion in a timely fashion, and for retraction, instrumentation, closure.

## 2018-07-25 NOTE — Transfer of Care (Addendum)
Immediate Anesthesia Transfer of Care Note  Patient: Angela Villegas  Procedure(s) Performed: OPEN REDUCTION INTERNAL FIXATION (ORIF) ANKLE FRACTURE (Right Ankle)  Patient Location: PACU  Anesthesia Type:GA combined with regional for post-op pain  Level of Consciousness: drowsy  Airway & Oxygen Therapy: Patient Spontanous Breathing and Patient connected to nasal cannula oxygen  Post-op Assessment: Report given to RN and Post -op Vital signs reviewed and stable  Post vital signs: Reviewed and stable  Last Vitals:  Vitals Value Taken Time  BP 118/85 07/25/2018  1:42 PM  Temp    Pulse 80 07/25/2018  1:37 PM  Resp 10 07/25/2018  1:37 PM  SpO2 98 % 07/25/2018  1:37 PM  Vitals shown include unvalidated device data.  Last Pain:  Vitals:   07/24/18 2347  TempSrc: Oral  PainSc:       Patients Stated Pain Goal: 3 (07/24/18 2230)  Complications: No apparent anesthesia complications

## 2018-07-25 NOTE — Anesthesia Procedure Notes (Signed)
Anesthesia Regional Block: Adductor canal block   Pre-Anesthetic Checklist: ,, timeout performed, Correct Patient, Correct Site, Correct Laterality, Correct Procedure, Correct Position, site marked, Risks and benefits discussed, pre-op evaluation,  At surgeon's request and post-op pain management  Laterality: Right  Prep: Maximum Sterile Barrier Precautions used, chloraprep       Needles:  Injection technique: Single-shot  Needle Type: Echogenic Stimulator Needle     Needle Length: 9cm  Needle Gauge: 21     Additional Needles:   Procedures:,,,, ultrasound used (permanent image in chart),,,,  Narrative:  Start time: 07/25/2018 11:50 AM End time: 07/25/2018 11:55 AM Injection made incrementally with aspirations every 5 mL.  Performed by: Personally  Anesthesiologist: Kipp Brood, MD  Additional Notes: 25 cc 0.25% Bupivacaine 1:200 epi injected easily

## 2018-07-25 NOTE — Anesthesia Procedure Notes (Signed)
Anesthesia Regional Block: Popliteal block   Pre-Anesthetic Checklist: ,, timeout performed, Correct Patient, Correct Site, Correct Laterality, Correct Procedure, Correct Position, site marked, Risks and benefits discussed,  Surgical consent,  Pre-op evaluation,  At surgeon's request and post-op pain management  Laterality: Right  Prep: chloraprep       Needles:  Injection technique: Single-shot  Needle Type: Stimulator Needle - 40      Needle Gauge: 22     Additional Needles:   Procedures:, nerve stimulator,,,,,,,  Narrative:  Start time: 07/25/2018 11:45 AM End time: 07/25/2018 11:50 AM Injection made incrementally with aspirations every 5 mL.  Performed by: Personally  Anesthesiologist: Kipp Brood, MD  Additional Notes: 20 cc 0.75% Ropivacaine injected easily

## 2018-07-25 NOTE — Discharge Instructions (Signed)
°  Ramond Marrow MD, MPH [facility] [address] [phone] (tel)   [phone] (fax)   POST-OPERATIVE INSTRUCTIONS   WOUND CARE ? Please keep splint clean dry and intact until followup.  ? You may shower on Post-Op Day #2. You must keep splint dry during this process and may find that a plastic bag taped around the leg or alternatively a towel based bath may be a better option.  If you get your splint wet or if it is damaged please contact our clinic.  EXERCISES ? Due to your splint being in place you will not be able to bear weight through your extremity.  Please use crutches or a walker to avoid weight bearing.   POST-OP MEDICINES ? A multi-modal approach will be used to treat your pain.  Oxycodone - This is a strong narcotic, to be used only on an as needed basis for pain.  Acetaminophen 500mg - A non-narcotic pain medicine.  Use 1000mg  three times a day for the first 14 days after surgery    Zofran 4mg  - This is an anti-nausea medicine to be used only if you are having nausea or vomitting. ? If you have any adverse effects with the medications, please call our office.  FOLLOW-UP ? If you develop a Fever (>101.5), Redness or Drainage from the surgical incision site, please call our office to arrange for an evaluation. ? Please call the office to schedule a follow-up appointment for your suture removal, 10-14 days post-operatively.  IF YOU HAVE ANY QUESTIONS, PLEASE FEEL FREE TO CALL OUR OFFICE.  HELPFUL INFORMATION ? You should wean off your narcotic medicines as soon as you are able.  Most patients will be off or using minimal narcotics before their first postop appointment.    ? We suggest you use the pain medication the first night prior to going to bed, in order to ease any pain when the anesthesia wears off. You should avoid taking pain medications on an empty stomach as it will make you nauseous.  ? Do not drink alcoholic beverages  or take illicit drugs when taking pain medications.  ? In most states it is against the law to drive while you are in a splint or sling.  And certainly against the law to drive while taking narcotics.  ? You may return to work/school in the next couple of days when you feel up to it.   ? Pain medication may make you constipated.  Below are a few solutions to try in this order: - Decrease the amount of pain medication if you arent having pain. - Drink lots of decaffeinated fluids. - Drink prune juice and/or each dried prunes  o If the first 3 dont work start with additional solutions - Take Colace - an over-the-counter stool softener - Take Senokot - an over-the-counter laxative - Take Miralax - a stronger over-the-counter laxative

## 2018-07-25 NOTE — Progress Notes (Signed)
CSW met with patient. CSW asked if she could complete a substance abuse with the patient, she stated that she could.   She is a former drug user. Wants to pursue treatment. Would like intensive inpatient. Asked if CSW could leave resources and she and her partner could go over it.   CSW signing off.   Domenic Schwab, MSW, [place]

## 2018-07-25 NOTE — Progress Notes (Addendum)
[facility] Progress Note  Patient: Angela Villegas WUJ:[id number]   PCP: Shirlean MylarWebb, Carol, MD DOB: 1990/11/12   DOA: 07/17/2018   DOS: 07/25/2018   Date of Service: the patient was seen and examined on 07/25/2018  Brief hospital course: Pt. with PMH of cirrhosis, alcohol abuse and withdrawal, ADD, and T2DM ; admitted on 07/17/2018, presented with complaint of seizure, was found to have alcohol withdrawal induced seizure along with right ankle fracture and tongue laceration.  She typically drinks 1/2 liter of vodka a day, last drink was 1 07/16/2018.  2 seizures at home, 1 seizure here in the ER.  Started on telemetry admitted to ICU,.  [facility]. -Plan was for ORIF 1/8 however has been febrile since, surgery deferred -1/9 ID consulted, started on IV vancomycin for possible thrombophlebitis  Subjective:  -Improving, no further fevers, no withdrawal symptoms  Assessment and Plan:  Alcohol withdrawal seizures  -Was admitted to the ICU on Precedex -improved from this standpoint, she was started on 500 mg twice daily Keppra admission, I discussed with neurology tapered off Keppra -Withdrawals have resolved, cutdown Ativan, continue thiamine -No driving/operating machinery etc for atleast 6months  Trimalleolar right ankle fracture -Ortho following, plan for ORIF, surgery postponed due to fevers  -Now afebrile for 48 hours, plan for ORIF Monday will make n.p.o. after midnight   Fever/thrombophlebitis of left antecubital vein -Greatly appreciate ID consult, she started IV vancomycin 1/9 -Blood cultures are negative, UA was unremarkable however urine cultures grew enterococcus faecalis, she is asymptomatic from a urinary standpoint I suspect this is colonization -Dopplers negative for DVT, no ascites noted on abdominal ultrasound so could not have SBP -afebrile now on vancomycin, discontinue after surgery   Alcohol induced cirrhosis. -Stable, restarted Inderal at home dose of 20  mg daily  -No longer on diuretics, clinically appears euvolemic   Tongue laceration. PCCM discussed with ENT, recommend oral and topical pain medication. SLP evaluation currently recommending full liquid diet secondary to difficulty swallowing secondary to pain.  Type 2 Diabetes Mellitus, uncontroled without any complication Hemoglobin A1c was 6.4 , compared to 11 in 2019, -Home medications held, continue sliding scale insulin  Obesity Body mass index is 30.55 kg/m.  Dietary consultation. Currently unable to eat anything due to tongue laceration.  Urinary retention. -resolved, Foley catheter discontinued  DVT Prophylaxis: subcutaneous Heparin  Advance goals of care discussion: full code  Family Communication: Spouse at bedside  Disposition: Home tomorrow  Consultants: PCCM, orthopedics  Procedures: none  Scheduled Meds: . [MAR Hold] docusate sodium  100 mg Oral BID  . [MAR Hold] enoxaparin (LOVENOX) injection  40 mg Subcutaneous Q24H  . [MAR Hold] fluocinonide cream   Topical BID  . [MAR Hold] folic acid  1 mg Oral Daily  . [MAR Hold] insulin aspart  0-9 Units Subcutaneous TID WC  . [MAR Hold] LORazepam  2 mg Intravenous Once  . [MAR Hold] magic mouthwash w/lidocaine  15 mL Oral QID  . [MAR Hold] mouth rinse  15 mL Mouth Rinse BID  . [MAR Hold] multivitamin with minerals  1 tablet Oral Daily  . [MAR Hold] polyethylene glycol  17 g Oral Daily  . [MAR Hold] propranolol  20 mg Oral Daily  . [MAR Hold] sertraline  100 mg Oral Daily  . [MAR Hold] thiamine  100 mg Oral Daily   Continuous Infusions: .  ceFAZolin (ANCEF) IV    . lactated ringers 10 mL/hr at 07/25/18 1127  . [MAR Hold] vancomycin 1,250  mg (07/24/18 2249)   PRN Meds: [MAR Hold] HYDROcodone-acetaminophen, [MAR Hold] LORazepam, [MAR Hold] magic mouthwash w/lidocaine, [MAR Hold]  morphine injection, [MAR Hold] ondansetron (ZOFRAN) IV, [MAR Hold] phenol, [MAR Hold] pneumococcal 23 valent  vaccine Antibiotics: Anti-infectives (From admission, onward)   Start     Dose/Rate Route Frequency Ordered Stop   07/25/18 1200  ceFAZolin (ANCEF) IVPB 2g/100 mL premix     2 g 200 mL/hr over 30 Minutes Intravenous To ShortStay Surgical 07/25/18 1118 07/26/18 1200   07/22/18 2000  [MAR Hold]  vancomycin (VANCOCIN) 1,250 mg in sodium chloride 0.9 % 250 mL IVPB     (MAR Hold since Mon 07/25/2018 at 1119. Reason: Transfer to a Procedural area.)   1,250 mg 166.7 mL/hr over 90 Minutes Intravenous Every 24 hours 07/21/18 1837     07/21/18 2000  vancomycin (VANCOCIN) 1,750 mg in sodium chloride 0.9 % 500 mL IVPB     1,750 mg 250 mL/hr over 120 Minutes Intravenous  Once 07/21/18 1837 07/22/18 0011       Objective: Physical Exam: Vitals:   07/24/18 2347 07/25/18 0559 07/25/18 0748 07/25/18 1126  BP: (!) 112/94  106/68   Pulse: 82  76   Resp: 16  18   Temp: 99 F (37.2 C)  97.8 F (36.6 C)   TempSrc: Oral     SpO2: 98%  98%   Weight:  73.3 kg  73.3 kg  Height:    5' 0.98" (1.549 m)    Intake/Output Summary (Last 24 hours) at 07/25/2018 1216 Last data filed at 07/24/2018 2249 Gross per 24 hour  Intake 500 ml  Output 450 ml  Net 50 ml   Filed Weights   07/22/18 0634 07/25/18 0559 07/25/18 1126  Weight: 73.9 kg 73.3 kg 73.3 kg   Gen: Awake, Alert, Oriented X 3, obese chronically ill, no distress HEENT: PERRLA, Neck supple, no JVD Lungs: Good air movement bilaterally, CTAB CVS: RRR,No Gallops,Rubs or new Murmurs Abd: soft, Non tender, non distended, BS present Extremities: Left upper extremity area of swelling and tenderness overlying the antecubital vein is significantly better, right foot with dressing, edema and ecchymosis noted in the distal foot Skin: Bilateral postauricular areas with scaling and eczema eczema  Data Reviewed: CBC: Recent Labs  Lab 07/20/18 0315 07/21/18 0320 07/22/18 0437 07/23/18 0252 07/24/18 0253 07/25/18 0304  WBC 6.8 6.9 6.5 6.7 8.4 10.2   NEUTROABS 5.0  --   --   --   --   --   HGB 11.4* 11.6* 12.0 11.5* 12.0 12.4  HCT 35.1* 35.8* 35.3* 35.4* 35.5* 38.3  MCV 86.0 86.1 85.3 85.5 86.0 85.9  PLT 136* 173 194 242 358 389   Basic Metabolic Panel: Recent Labs  Lab 07/19/18 0233 07/20/18 0315 07/21/18 0320 07/22/18 0437 07/23/18 0252 07/24/18 0253 07/25/18 0304  NA 133* 131* 132* 129* 133* 131* 131*  K 3.8 3.2* 3.0* 3.1* 3.8 3.3* 3.9  CL 101 100 98 96* 101 99 99  CO2 21* 21* 21* 22 22 22 23   GLUCOSE 103* 104* 103* 113* 116* 112* 108*  BUN <5* <5* 5* 6 <5* 5* 5*  CREATININE 0.47 0.49 0.51 0.42* 0.41* 0.41* 0.41*  CALCIUM 7.5* 7.5* 7.6* 7.5* 7.6* 8.1* 8.3*  MG 2.0 1.6*  --   --   --   --   --     Liver Function Tests: Recent Labs  Lab 07/19/18 0233 07/20/18 0315 07/21/18 0320 07/23/18 0252  AST 135* 124* 143* 140*  ALT 32 32 37 41  ALKPHOS 96 102 106 107  BILITOT 2.8* 2.8* 2.8* 2.4*  PROT 7.4 7.4 7.5 7.2  ALBUMIN 3.1* 3.0* 3.0* 2.8*   No results for input(s): LIPASE, AMYLASE in the last 168 hours. No results for input(s): AMMONIA in the last 168 hours. Coagulation Profile: Recent Labs  Lab 07/19/18 0233 07/20/18 0315  INR 1.15 1.16   Cardiac Enzymes: No results for input(s): CKTOTAL, CKMB, CKMBINDEX, TROPONINI in the last 168 hours. BNP (last 3 results) No results for input(s): PROBNP in the last 8760 hours. CBG: Recent Labs  Lab 07/24/18 1142 07/24/18 1721 07/24/18 2144 07/25/18 0721 07/25/18 1104  GLUCAP 111* 114* 121* 105* 97   Studies: No results found.   Time spent: 25 minutes  Author: Zannie Cove, MD  07/25/2018 12:16 PM  Between 7PM-7AM, please contact night-coverage at [email or web address], password [facility]

## 2018-07-26 ENCOUNTER — Encounter (HOSPITAL_COMMUNITY): Payer: Self-pay | Admitting: Orthopaedic Surgery

## 2018-07-26 LAB — CULTURE, BLOOD (ROUTINE X 2)
Culture: NO GROWTH
Culture: NO GROWTH
Special Requests: ADEQUATE
Special Requests: ADEQUATE

## 2018-07-26 LAB — GLUCOSE, CAPILLARY
Glucose-Capillary: 202 mg/dL — ABNORMAL HIGH (ref 70–99)
Glucose-Capillary: 153 mg/dL — ABNORMAL HIGH (ref 70–99)
Glucose-Capillary: 81 mg/dL (ref 70–99)

## 2018-07-26 MED ORDER — POLYETHYLENE GLYCOL 3350 17 G PO PACK: 17.0000 g | PACK | Freq: Every day | ORAL | 0 refills | Status: AC | PRN

## 2018-07-26 NOTE — Evaluation (Signed)
Physical Therapy Evaluation Patient Details Name: Angela Villegas MRN: [id number] DOB: June 30, 1991 Today's Date: 07/26/2018   History of Present Illness  27yo female brought to the ED with seizures and heavy ROH use. Tried to stand in ED, fell and broke her ankle and received R ankle ORIF, now NWB. PMH ADD, alcohol abuse, DM, HTN   Clinical Impression   Patient received in bed, pleasant and willing to work with therapy; Mod(I) for bed mobility but does require min guard and VC for safety with transfers and gait with RW, able to maintain NWB status well however. Able to toilet with S. Also practiced stairs with rail on R, crutch on L for ascent/descent of single step multiple times, min guard required for safety but patient able to maintain NWB status during this transition as well. Finally practiced mounting/dismounting knee scooter, able to do so with min guard and VC for safety. Feel that she will be able to return home with HHPT and 24/7 S from family.     Follow Up Recommendations Home health PT    Equipment Recommendations  Rolling walker with 5" wheels;Crutches;3in1 (PT);Other (comment)(tub bench , knee scooter)   Recommendations for Other Services       Precautions / Restrictions Precautions Precautions: Fall Restrictions Weight Bearing Restrictions: Yes RLE Weight Bearing: Non weight bearing      Mobility  Bed Mobility Overal bed mobility: Modified Independent             General bed mobility comments: no physical assist given   Transfers Overall transfer level: Needs assistance Equipment used: Rolling walker (2 wheeled) Transfers: Sit to/from Stand Sit to Stand: Supervision         General transfer comment: S for safety, VC for safety   Ambulation/Gait Ambulation/Gait assistance: Min guard Gait Distance (Feet): 30 Feet Assistive device: Rolling walker (2 wheeled) Gait Pattern/deviations: Step-to pattern;Trunk flexed;Narrow base of support Gait velocity:  decreased    General Gait Details: swing to pattern with RW, slow pace and cues to use UEs to offload and support weight with gait and steps   Stairs Stairs: Yes Stairs assistance: Min guard Stair Management: One rail Right;With crutches Number of Stairs: 1 General stair comments: multiple practice with single step with R rail/L crutch, able to maintain NWB R LE   Wheelchair Mobility    Modified Rankin (Stroke Patients Only)       Balance Overall balance assessment: Mild deficits observed, not formally tested                                           Pertinent Vitals/Pain Pain Assessment: 0-10 Pain Score: 5  Pain Location: R ankle  Pain Descriptors / Indicators: Throbbing;Sharp Pain Intervention(s): Limited activity within patient's tolerance;Monitored during session;Patient requesting pain meds-RN notified    Home Living Family/patient expects to be discharged to:: Private residence Living Arrangements: Spouse/significant other Available Help at Discharge: Family;Available 24 hours/day Type of Home: House(no stairs at their home but will be staying at mother's for 2 days with 2 steps ) Home Access: Stairs to enter Entrance Stairs-Rails: Right Entrance Stairs-Number of Steps: 2 Home Layout: One level Home Equipment: None      Prior Function Level of Independence: Independent               Hand Dominance        Extremity/Trunk Assessment   Upper  Extremity Assessment Upper Extremity Assessment: Overall WFL for tasks assessed    Lower Extremity Assessment Lower Extremity Assessment: Generalized weakness    Cervical / Trunk Assessment Cervical / Trunk Assessment: Normal  Communication   Communication: No difficulties  Cognition Arousal/Alertness: Awake/alert Behavior During Therapy: WFL for tasks assessed/performed Overall Cognitive Status: Within Functional Limits for tasks assessed                                         General Comments      Exercises     Assessment/Plan    PT Assessment Patient needs continued PT services  PT Problem List Decreased strength;Decreased coordination;Pain;Decreased activity tolerance;Decreased knowledge of use of DME;Decreased balance;Decreased safety awareness;Decreased mobility       PT Treatment Interventions DME instruction;Therapeutic exercise;Gait training;Balance training;Stair training;Neuromuscular re-education;Functional mobility training;Therapeutic activities;Patient/family education    PT Goals (Current goals can be found in the Care Plan section)  Acute Rehab PT Goals Patient Stated Goal: go home  PT Goal Formulation: With patient/family Time For Goal Achievement: 08/09/18 Potential to Achieve Goals: Good    Frequency Min 5X/week   Barriers to discharge        Co-evaluation               AM-PAC PT "6 Clicks" Mobility  Outcome Measure Help needed turning from your back to your side while in a flat bed without using bedrails?: None Help needed moving from lying on your back to sitting on the side of a flat bed without using bedrails?: None Help needed moving to and from a bed to a chair (including a wheelchair)?: A Little Help needed standing up from a chair using your arms (e.g., wheelchair or bedside chair)?: A Little Help needed to walk in hospital room?: A Little Help needed climbing 3-5 steps with a railing? : A Little 6 Click Score: 20    End of Session Equipment Utilized During Treatment: Gait belt Activity Tolerance: Patient tolerated treatment well Patient left: in bed;with nursing/sitter in room;with family/visitor present   PT Visit Diagnosis: Difficulty in walking, not elsewhere classified (R26.2);Muscle weakness (generalized) (M62.81)    Time: 5498-2641 PT Time Calculation (min) (ACUTE ONLY): 40 min   Charges:   PT Evaluation $PT Eval Low Complexity: 1 Low PT Treatments $Gait Training: 23-37 mins        Nedra Hai PT, DPT, CBIS  Supplemental Physical Therapist [facility]    Pager [phone] Acute Rehab Office [phone]

## 2018-07-26 NOTE — Care Management Note (Signed)
Case Management Note  Patient Details  Name: Angela Villegas MRN: [id number] Date of Birth: July 18, 1990  Subjective/Objective:   From home, s/p ORIF ankle fx, for dc today, will need HHPT and Knee scooter and crutches.  NCM offered choice from the Medicare.gov list, patient chose [facility], [facility] to [facility] with [facility], for HHPT, soc will begin 24-48 hrs post dc. NCM gave patient the order for the knee scooter so she could go to [facility] store to pick up. Ortho tech brought crutches up to room.                   Action/Plan: DC home when ready,  Expected Discharge Date:  07/26/18               Expected Discharge Plan:  Home w Home Health Services  In-House Referral:  Clinical Social Work  Discharge planning Services  CM Consult  Post Acute Care Choice:  Durable Medical Equipment, Home Health Choice offered to:  Patient  DME Arranged:  Crutches, Other see comment DME Agency:  [facility]  HH Arranged:  PT HH Agency:  [facility]  Status of Service:  Completed, signed off  If discussed at Long Length of Stay Meetings, dates discussed:    Additional Comments:  Leone Haven, RN 07/26/2018, 12:13 PM

## 2018-07-26 NOTE — Progress Notes (Signed)
Orthopedic Tech Progress Note Patient Details:  Angela Villegas Oct 27, 1990 [id number]  Ortho Devices Type of Ortho Device: Crutches Ortho Device/Splint Location: rle. applied post reduction at drs request. Ortho Device/Splint Interventions: Adjustment   Post Interventions Patient Tolerated: Well Instructions Provided: Care of device, Adjustment of device   Saul FordyceJennifer C Alix Lahmann 07/26/2018, 12:23 PM

## 2018-07-27 NOTE — Discharge Summary (Signed)
Physician Discharge Summary  Angela Villegas ZOX:[id number] DOB: 1991-02-03 DOA: 07/17/2018  PCP: Shirlean Mylar, MD  Admit date: 07/17/2018 Discharge date: 07/26/2018  Time spent: 45 minutes  Recommendations for Outpatient Follow-up:  1. PCP in 1 week, monitor CBGs closely and determine if she needs to be restarted on low-dose insulin, hardly required any insulin inpatient 2. Orthopedics Dr. 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 with PMH of cirrhosis, alcohol abuse and withdrawal, ADD, and T2DM ; admitted on 07/17/2018, presented with complaint of seizure, was found to have alcohol withdrawal induced seizure along with right ankle fracture and tongue laceration  Hospital Course:   Alcohol withdrawal seizures  -Was admitted to the ICU on Precedex -improved from this standpoint, she was started on 500 mg twice daily Keppra admission, I discussed with neurology tapered off Keppra -Withdrawals have resolved, discontinued Ativan, continue thiamine -No driving/operating machinery etc for atleast 6months  Trimalleolar right ankle fracture -Ortho consulted, plan for ORIF, surgery postponed due to fevers  -Now afebrile for 48 hours, underwent ORIF on Monday 1/13 -Stable, nonweightbearing at discharge, close follow-up with orthopedics in 2 weeks  Fever/thrombophlebitis of left antecubital vein -Hospital course was complicated by recurrent fevers   -After extensive work-up she was noted to have thrombophlebitis of her left antecubital vein  -Was also seen by ID, treated with vancomycin, improved  -Antibiotics discontinued postop -Fevers have resolved   Alcohol induced cirrhosis. -Stable, restarted Inderal at home dose of 20 mg daily  -No longer on diuretics, clinically appears euvolemic   Tongue laceration. PCCM discussed with ENT, recommend supportive care  Type 2 Diabetes Mellitus, uncontroled without any complication Hemoglobin A1c was 6.4 , compared to 11 in 2019, -She was on Lantus prior to admission, CBGs in the 80-1 10 range throughout this hospital stay except for rare CBG of 200 on the day of surgery -Did not require any long-acting insulin -Advised patient to follow carb modified diet and monitor CBGs closely, take the log to her PCP and determine if she needs to be restarted on insulin  Obesity Body mass index is 30.55 kg/m.  Dietary consulted, recommendations given  Urinary retention. -resolved, Foley catheter discontinued   Procedures: Procedure: ORIF trimalleolar ankle fracture without fixation of posterior lip  Consultations:  Orthopedics Dr. Everardo Pacific  Infectious disease Dr. Orvan Falconer  Discharge Exam: Vitals:   07/26/18 1039 07/26/18 1150  BP: 114/83 122/87  Pulse: 93 81  Resp:  (!) 22  Temp:    SpO2:  100%    General: Alert awake oriented x3 Cardiovascular: S1-S2/regular rate rhythm Respiratory: Clear bilaterally  Discharge Instructions   Discharge Instructions    Diet - low sodium heart healthy   Complete by:  As directed    Diet Carb Modified   Complete by:  As directed    Increase activity slowly   Complete by:  As directed      Allergies as of 07/26/2018  Reactions   Vicodin [hydrocodone-acetaminophen] Itching, Nausea Only      Medication List    STOP taking these medications   carbamazepine 200 MG tablet Commonly known as:  TEGRETOL   ferrous sulfate 325 (65  FE) MG tablet   insulin glargine 100 UNIT/ML injection Commonly known as:  LANTUS     TAKE these medications   amphetamine-dextroamphetamine 10 MG tablet Commonly known as:  ADDERALL Take 20 mg by mouth 2 (two) times daily.   ARTIFICIAL TEARS OP Place 1 drop into both eyes as needed (dry eye).   fluticasone 50 MCG/ACT nasal spray Commonly known as:  FLONASE Place 2 sprays into both nostrils daily as needed for allergies.   folic acid 1 MG tablet Commonly known as:  FOLVITE Take 1 tablet (1 mg total) by mouth daily.   magnesium oxide 400 (241.3 Mg) MG tablet Commonly known as:  MAG-OX Take 1 tablet (400 mg total) by mouth 2 (two) times daily.   mupirocin ointment 2 % Commonly known as:  BACTROBAN Place 1 application into the nose 2 (two) times daily. For skin irritation   omeprazole 20 MG capsule Commonly known as:  PRILOSEC Take 1 capsule (20 mg total) by mouth daily for 14 days.   ondansetron 4 MG disintegrating tablet Commonly known as:  ZOFRAN ODT Take 1 tablet (4 mg total) by mouth every 6 (six) hours as needed for nausea or vomiting.   oxyCODONE 5 MG immediate release tablet Commonly known as:  Oxy IR/ROXICODONE Take 1-2 pills every 6 hrs as needed for pain, no more than 6 per day   polyethylene glycol packet Commonly known as:  MIRALAX / GLYCOLAX Take 17 g by mouth daily as needed.   PROAIR HFA 108 (90 Base) MCG/ACT inhaler Generic drug:  albuterol Inhale 2 puffs into the lungs every 4 (four) hours as needed for wheezing.   propranolol 10 MG tablet Commonly known as:  INDERAL Take 10 mg by mouth 2 (two) times daily.   sertraline 100 MG tablet Commonly known as:  ZOLOFT Take 100 mg by mouth daily.   thiamine 100 MG tablet Commonly known as:  VITAMIN B-1 Take 1 tablet (100 mg total) by mouth daily.      Allergies  Allergen Reactions  . Vicodin [Hydrocodone-Acetaminophen] Itching and Nausea Only   Follow-up Information    Bjorn PippinVarkey, Dax T, MD.  Schedule an appointment as soon as possible for a visit in 2 weeks.   Specialty:  Orthopedic Surgery Why:  For wound re-check Contact information: [address] [place] [address] [phone]        Shirlean MylarWebb, Carol, MD. Schedule an appointment as soon as possible for a visit in 1 week(s).   Specialty:  Family Medicine Why:  Please monitor Blood sugars closely and take this log to PCP in 1 week Contact information: [address] [place] [address] [phone]        [facility].   Specialty:  Home Health Services Why:  HHPT Contact information: [address] [place] [address] [phone]            The results of significant diagnostics from this hospitalization (including imaging, microbiology, ancillary and laboratory) are listed below for reference.    Significant Diagnostic Studies: Dg Chest 2 View  Result Date: 07/20/2018 CLINICAL DATA:  Alcohol withdrawals and seizure. EXAM: CHEST - 2 VIEW COMPARISON:  07/17/2018 FINDINGS: Normal heart size. Lungs  clear. No pneumothorax. No pleural effusion. IMPRESSION: No active cardiopulmonary disease. Electronically Signed   By: Jolaine Click M.D.   On: 07/20/2018 10:35   Dg Ankle Complete Right  Result Date: 07/19/2018 CLINICAL DATA:  Fall with ankle pain. Initial fall with ankle fracture 2 days ago, fall again today in the hospital with ankle splinted. Increased pain. EXAM: RIGHT ANKLE - COMPLETE 3+ VIEW COMPARISON:  Ankle radiographs 07/17/2018 FINDINGS: Displaced fracture from the medial malleolus demonstrates slight increased angulation since prior exam, equivocal mild increase in degree of displacement. Oblique distal fibular fracture with slightly increased displacement. The ankle mortise is preserved. The previous bone fragment adjacent to the posterior malleolus is not as well seen on the current exam. Overlying splint material in place. There is  soft tissue edema. IMPRESSION: 1. Displaced medial malleolar fracture with slight increase in angulation and equivocal increase in degree of displacement. 2. Distal fibular fracture slightly increased in displacement from prior. 3. Previous tiny osseous fragment projecting posterior to the posterior malleolus is not as well seen on the current exam. Electronically Signed   By: Narda Rutherford M.D.   On: 07/19/2018 19:04   Dg Ankle Complete Right  Result Date: 07/17/2018 CLINICAL DATA:  Right ankle fracture. EXAM: RIGHT ANKLE - COMPLETE 3+ VIEW COMPARISON:  None. FINDINGS: Overlying splint material is identified. Obliquely oriented fracture through the distal fibula. Medial malleolar fracture is also noted which appears mildly displaced. A small sliver of bone fragment is adjacent to the posterior malleolus. IMPRESSION: 1. Bilayer fracture is not identified. 2. Small bone fragment off the posterior malleolus also noted. A CT of the right ankle may be helpful to evaluate for posterior malleolar injury. Electronically Signed   By: Signa Kell M.D.   On: 07/17/2018 09:16   Ct Head Wo Contrast  Result Date: 07/18/2018 CLINICAL DATA:  Change in mental status.  Seizure. EXAM: CT HEAD WITHOUT CONTRAST TECHNIQUE: Contiguous axial images were obtained from the base of the skull through the vertex without intravenous contrast. COMPARISON:  None. FINDINGS: Brain: No acute intracranial abnormality. Specifically, no hemorrhage, hydrocephalus, mass lesion, acute infarction, or significant intracranial injury. Vascular: No hyperdense vessel or unexpected calcification. Skull: No acute calvarial abnormality. Sinuses/Orbits: No acute finding. Other: None IMPRESSION: No intracranial abnormality. Electronically Signed   By: Charlett Nose M.D.   On: 07/18/2018 11:24   Dg Chest Portable 1 View  Result Date: 07/17/2018 CLINICAL DATA:  Seizure. EXAM: PORTABLE CHEST 1 VIEW COMPARISON:  06/10/2018 FINDINGS: The heart size and  mediastinal contours are within normal limits. Both lungs are clear. The visualized skeletal structures are unremarkable. IMPRESSION: No active disease. Electronically Signed   By: Signa Kell M.D.   On: 07/17/2018 07:37   Dg Tibia/fibula Right Port  Result Date: 07/19/2018 CLINICAL DATA:  Fall with ankle pain. Initial fall with ankle fracture 2 days ago, fall again today in the hospital with ankle splinted. Increased pain. EXAM: PORTABLE RIGHT TIBIA AND FIBULA - 2 VIEW COMPARISON:  Ankle radiographs 07/17/2018. Dedicated ankle exam performed concurrently. FINDINGS: Splint material in place. The proximal tibia and fibula are intact. Possible remote fracture of the mid fibular shaft. Ankle fractures better assessed on concurrent ankle exam. Mild soft tissue edema. IMPRESSION: Proximal tibia and fibula are intact. Ankle fractures better assessed on concurrent ankle exam. Electronically Signed   By: Narda Rutherford M.D.   On: 07/19/2018 19:00   Dg Ankle Right Port  Result Date: 07/25/2018 CLINICAL DATA:  ORIF of the right  ankle.  Postop check. EXAM: PORTABLE RIGHT ANKLE - 2 VIEW COMPARISON:  07/19/2018 FINDINGS: Reduction and internal fixation of the medial malleolus fracture with a compression screw. Plate and screw fixation of the distal fibular fracture. Old fracture with cortical thickening in the mid right fibula. Normal alignment of the right ankle on these two views. The ankle is located. Lower leg is in a cast/splint. IMPRESSION: Surgical fixation of the right ankle fracture. Right ankle is located. Electronically Signed   By: Richarda Overlie M.D.   On: 07/25/2018 14:30   Ir Abdomen US Limited  Result Date: 07/21/2018 CLINICAL DATA:  Evaluate for ascites and paracentesis. EXAM: LIMITED ABDOMEN ULTRASOUND FOR ASCITES TECHNIQUE: Limited ultrasound survey for ascites was performed in all four abdominal quadrants. COMPARISON:  12/12/2015 FINDINGS: No ascites identified in the 4 quadrants of the abdomen.  Fluid-filled bladder identified in the right lower quadrant. IMPRESSION: No ascites identified. Electronically Signed   By: Richarda Overlie M.D.   On: 07/21/2018 15:57   Vas [place] Lower Extremity Venous (dvt)  Result Date: 07/21/2018  Lower Venous Study Indications: Trauma.  Limitations: Bandages. Performing Technologist: Chanda Busing RVT  Examination Guidelines: A complete evaluation includes B-mode imaging, spectral Doppler, color Doppler, and power Doppler as needed of all accessible portions of each vessel. Bilateral testing is considered an integral part of a complete examination. Limited examinations for reoccurring indications may be performed as noted.  Right Venous Findings: +---------+---------------+---------+-----------+----------+-------+          CompressibilityPhasicitySpontaneityPropertiesSummary +---------+---------------+---------+-----------+----------+-------+ CFV      Full           Yes      Yes                          +---------+---------------+---------+-----------+----------+-------+ SFJ      Full                                                 +---------+---------------+---------+-----------+----------+-------+ FV Prox  Full                                                 +---------+---------------+---------+-----------+----------+-------+ FV Mid   Full                                                 +---------+---------------+---------+-----------+----------+-------+ FV DistalFull                                                 +---------+---------------+---------+-----------+----------+-------+ PFV      Full                                                 +---------+---------------+---------+-----------+----------+-------+ POP      Full           Yes  Yes                          +---------+---------------+---------+-----------+----------+-------+ PTV      Full                                                  +---------+---------------+---------+-----------+----------+-------+ PERO     Full                                                 +---------+---------------+---------+-----------+----------+-------+ Unable to assess entire calf due to fracture and bandages.  Left Venous Findings: +---------+---------------+---------+-----------+----------+-------+          CompressibilityPhasicitySpontaneityPropertiesSummary +---------+---------------+---------+-----------+----------+-------+ CFV      Full           Yes      Yes                          +---------+---------------+---------+-----------+----------+-------+ SFJ      Full                                                 +---------+---------------+---------+-----------+----------+-------+ FV Prox  Full                                                 +---------+---------------+---------+-----------+----------+-------+ FV Mid   Full                                                 +---------+---------------+---------+-----------+----------+-------+ FV DistalFull                                                 +---------+---------------+---------+-----------+----------+-------+ PFV      Full                                                 +---------+---------------+---------+-----------+----------+-------+ POP      Full           Yes      Yes                          +---------+---------------+---------+-----------+----------+-------+ PTV      Full                                                 +---------+---------------+---------+-----------+----------+-------+ PERO     Full                                                 +---------+---------------+---------+-----------+----------+-------+  Summary: Right: There is no evidence of deep vein thrombosis in the lower extremity. However, portions of this examination were limited- see technologist comments above. No cystic structure found in the  popliteal fossa. Left: There is no evidence of deep vein thrombosis in the lower extremity. No cystic structure found in the popliteal fossa.  *See table(s) above for measurements and observations. Electronically signed by Lemar Livings MD on 07/21/2018 at 2:40:44 PM.    Final     Microbiology: Recent Results (from the past 240 hour(s))  MRSA PCR Screening     Status: None   Collection Time: 07/17/18  3:00 PM  Result Value Ref Range Status   MRSA by PCR NEGATIVE NEGATIVE Final    Comment:        The GeneXpert MRSA Assay (FDA approved for NASAL specimens only), is one component of a comprehensive MRSA colonization surveillance program. It is not intended to diagnose MRSA infection nor to guide or monitor treatment for MRSA infections. Performed at [facility], [address] [place], [place] [address]   Culture, Urine     Status: Abnormal   Collection Time: 07/20/18  9:17 AM  Result Value Ref Range Status   Specimen Description URINE, RANDOM  Final   Special Requests   Final    NONE Performed at [facility], [address] [place], [place] [address]    Culture >=100,000 COLONIES/mL ENTEROCOCCUS FAECALIS (A)  Final   Report Status 07/22/2018 FINAL  Final   Organism ID, Bacteria ENTEROCOCCUS FAECALIS (A)  Final      Susceptibility   Enterococcus faecalis - MIC*    AMPICILLIN <=2 SENSITIVE Sensitive     LEVOFLOXACIN 1 SENSITIVE Sensitive     NITROFURANTOIN <=16 SENSITIVE Sensitive     VANCOMYCIN 2 SENSITIVE Sensitive     * >=100,000 COLONIES/mL ENTEROCOCCUS FAECALIS  Culture, blood (routine x 2)     Status: None   Collection Time: 07/20/18 10:45 AM  Result Value Ref Range Status   Specimen Description BLOOD RIGHT HAND  Final   Special Requests   Final    BOTTLES DRAWN AEROBIC AND ANAEROBIC Blood Culture adequate volume   Culture   Final    NO GROWTH 5 DAYS Performed at [facility], [address] [place], [place] [address]    Report Status 07/25/2018  FINAL  Final  Culture, blood (routine x 2)     Status: None   Collection Time: 07/20/18 10:51 AM  Result Value Ref Range Status   Specimen Description BLOOD LEFT HAND  Final   Special Requests   Final    BOTTLES DRAWN AEROBIC AND ANAEROBIC Blood Culture adequate volume   Culture   Final    NO GROWTH 5 DAYS Performed at [facility], [address], [place], [place] [address]    Report Status 07/25/2018 FINAL  Final  Culture, blood (routine x 2)     Status: None   Collection Time: 07/21/18  3:07 PM  Result Value Ref Range Status   Specimen Description BLOOD LEFT HAND  Final   Special Requests AEROBIC BOTTLE ONLY Blood Culture adequate volume  Final   Culture   Final    NO GROWTH 5 DAYS Performed at [facility], [address], [place], [place] [address]    Report Status 07/26/2018 FINAL  Final  Culture, blood (routine x 2)     Status: None   Collection Time: 07/21/18  3:11 PM  Result Value Ref Range Status   Specimen Description BLOOD RIGHT HAND  Final   Special Requests AEROBIC BOTTLE ONLY Blood Culture adequate volume  Final   Culture   Final    NO GROWTH 5 DAYS Performed at [facility], [address], [place], [place] [address]    Report Status 07/26/2018 FINAL  Final     Labs: Basic Metabolic Panel: Recent Labs  Lab 07/21/18 0320 07/22/18 0437 07/23/18 0252 07/24/18 0253 07/25/18 0304  NA 132* 129* 133* 131* 131*  K 3.0* 3.1* 3.8 3.3* 3.9  CL 98 96* 101 99 99  CO2 21* 22 22 22 23   GLUCOSE 103* 113* 116* 112* 108*  BUN 5* 6 <5* 5* 5*  CREATININE 0.51 0.42* 0.41* 0.41* 0.41*  CALCIUM 7.6* 7.5* 7.6* 8.1* 8.3*   Liver Function Tests: Recent Labs  Lab 07/21/18 0320 07/23/18 0252  AST 143* 140*  ALT 37 41  ALKPHOS 106 107  BILITOT 2.8* 2.4*  PROT 7.5 7.2  ALBUMIN 3.0* 2.8*   No results for input(s): LIPASE, AMYLASE in the last 168 hours. No results for input(s): AMMONIA in the last 168 hours. CBC: Recent Labs  Lab 07/21/18 0320  07/22/18 0437 07/23/18 0252 07/24/18 0253 07/25/18 0304  WBC 6.9 6.5 6.7 8.4 10.2  HGB 11.6* 12.0 11.5* 12.0 12.4  HCT 35.8* 35.3* 35.4* 35.5* 38.3  MCV 86.1 85.3 85.5 86.0 85.9  PLT 173 194 242 358 389   Cardiac Enzymes: No results for input(s): CKTOTAL, CKMB, CKMBINDEX, TROPONINI in the last 168 hours. BNP: BNP (last 3 results) No results for input(s): BNP in the last 8760 hours.  ProBNP (last 3 results) No results for input(s): PROBNP in the last 8760 hours.  CBG: Recent Labs  Lab 07/25/18 1717 07/25/18 2132 07/26/18 0737 07/26/18 0907 07/26/18 1107  GLUCAP 296* 161* 202* 153* 81       Signed:  Zannie Cove MD.  [facility] 07/27/2018, 2:12 PM

## 2019-05-14 DEATH — deceased

## 2020-06-10 IMAGING — CT CT HEAD W/O CM
4 series · 17 of 47 positions shown, 19 images · non-contrast
Comparison: None.

CLINICAL DATA: Change in mental status.  Seizure.

EXAM:
CT HEAD WITHOUT CONTRAST
TECHNIQUE: Contiguous axial images were obtained from the base of the skull
through the vertex without intravenous contrast.

[Series 3: head wo · axial · 0.39mm/px · z∈[-130,-10]mm · 7 of 33 slices shown, 9 images]
[im 5/33  brain]
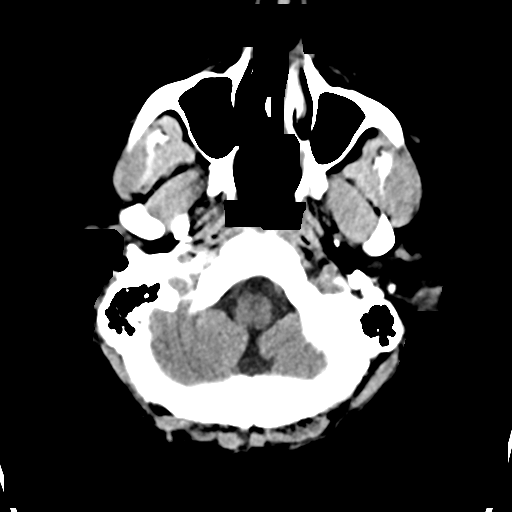
[im 5/33  bone]
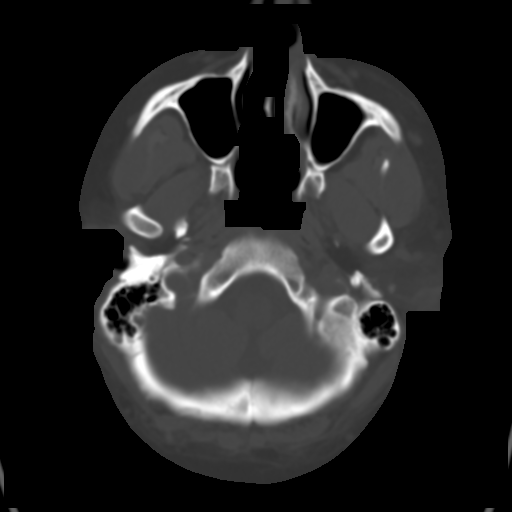
[im 9/33  brain]
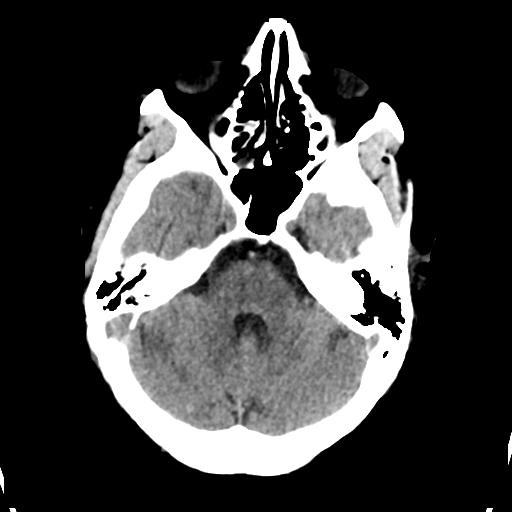
[im 13/33  brain]
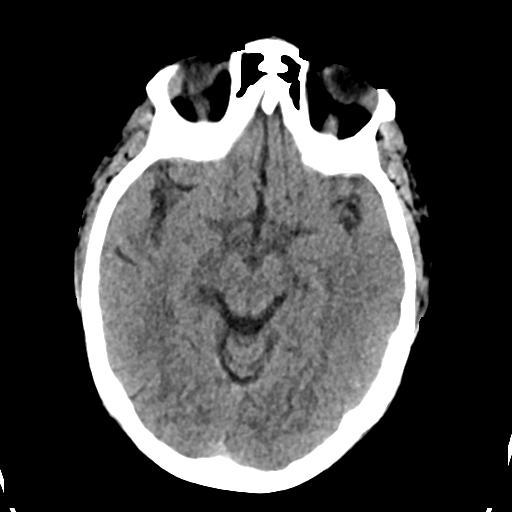
[im 17/33  brain]
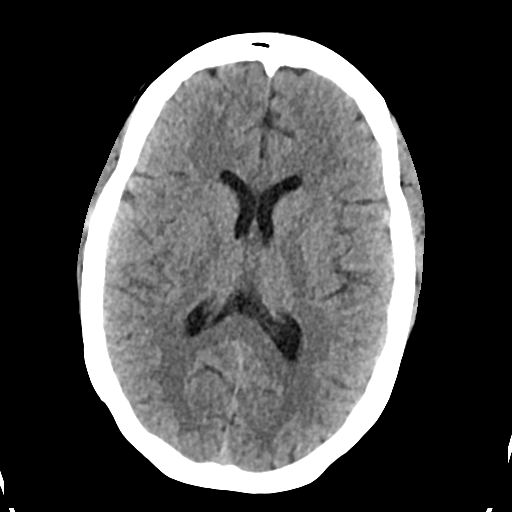
[im 21/33  brain]
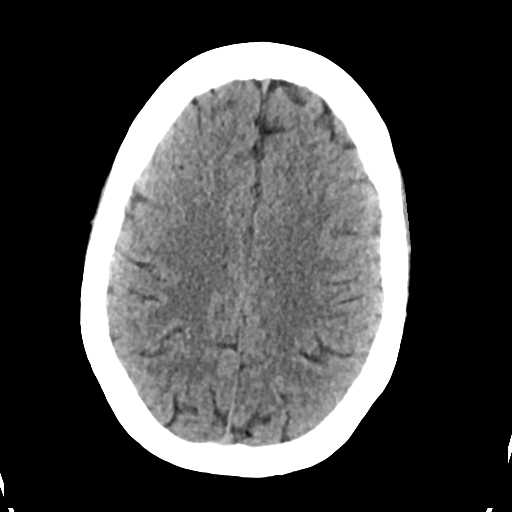
[im 21/33  bone]
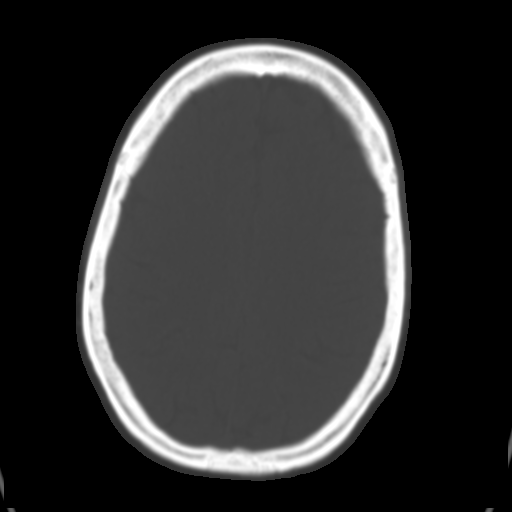
[im 25/33  brain]
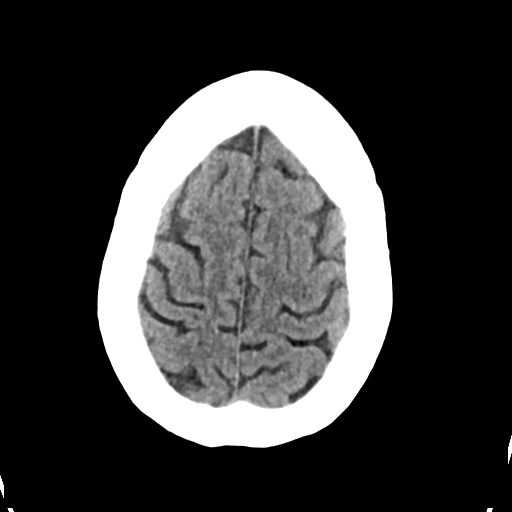
[im 29/33  brain]
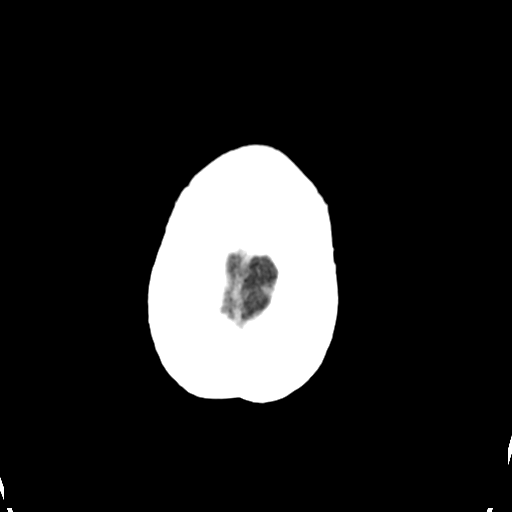

[Series 4: head bone · axial · 0.39mm/px · z∈[-134,-78]mm · 4 of 82 slices shown]
[im 9/82  bone]
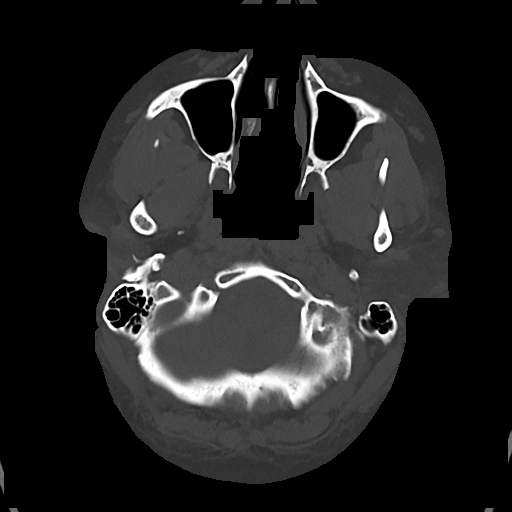
[im 17/82  bone]
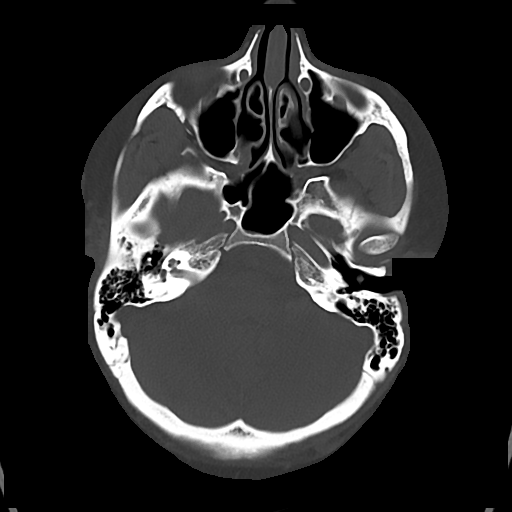
[im 25/82  bone]
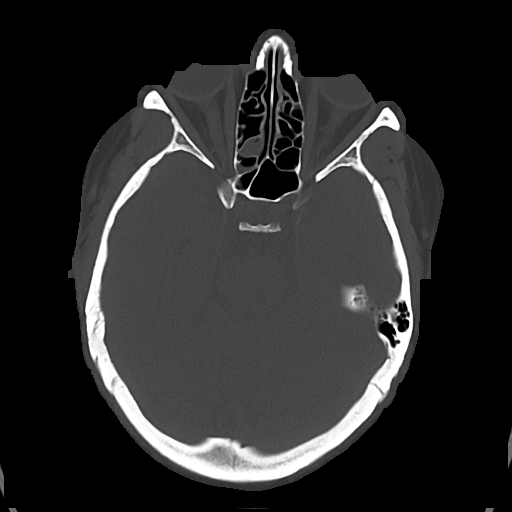
[im 37/82  bone]
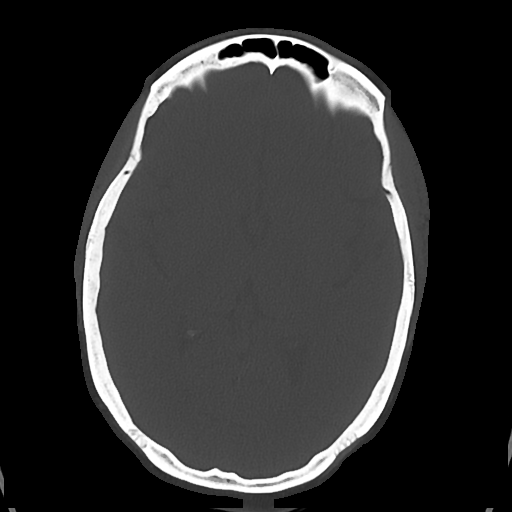

[Series 5: cor soft · coronal · 0.32mm/px · 3 of 67 slices shown]
[im 23/67  brain]
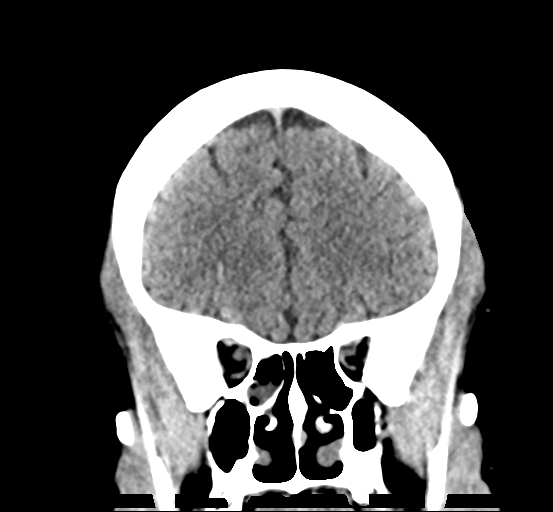
[im 30/67  brain]
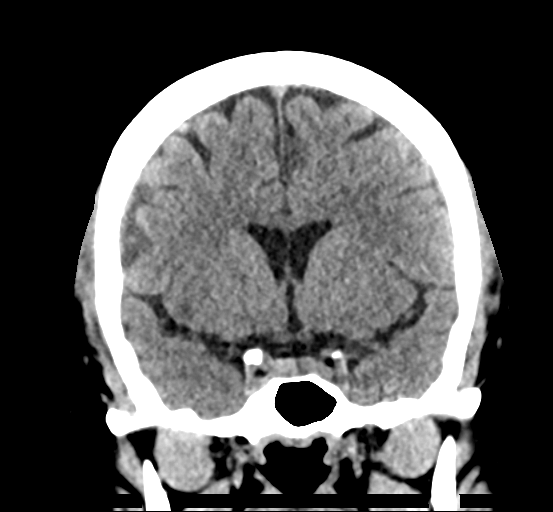
[im 37/67  brain]
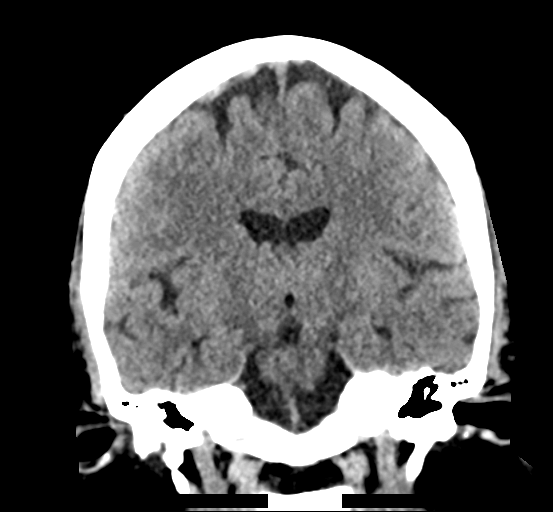

[Series 6: sag soft · sagittal · 0.32mm/px · 3 of 67 slices shown]
[im 23/67  brain]
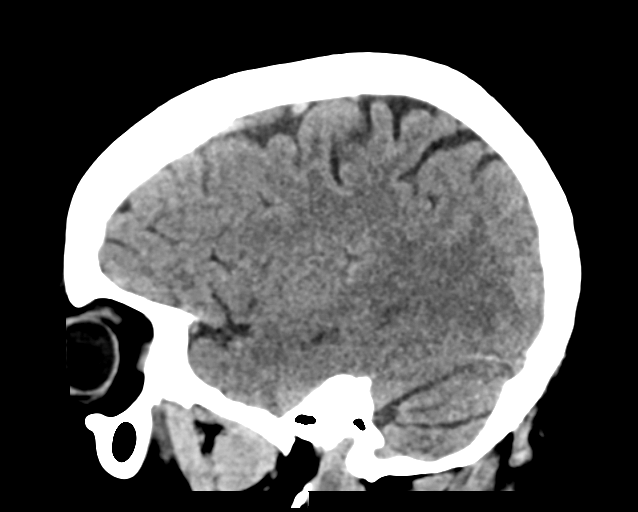
[im 34/67  brain]
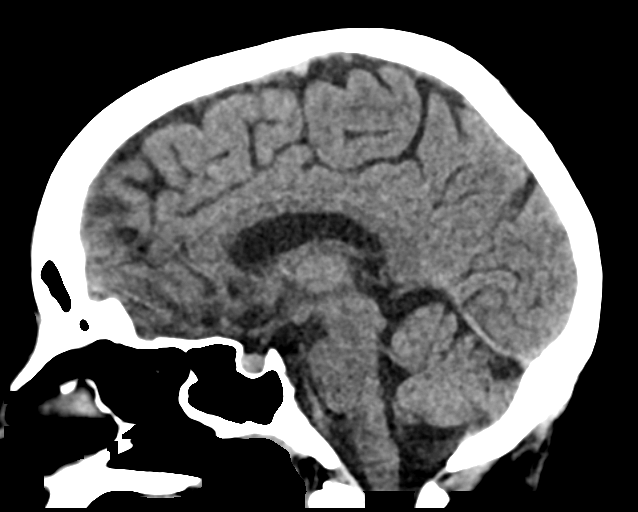
[im 45/67  brain]
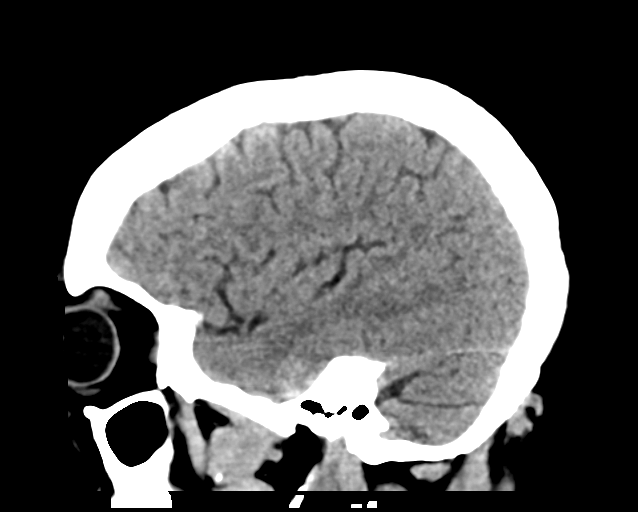

[17 of 47 positions shown; findings below may reference images not displayed]

FINDINGS: Brain: No acute intracranial abnormality. Specifically, no
hemorrhage, hydrocephalus, mass lesion, acute infarction, or
significant intracranial injury.

Vascular: No hyperdense vessel or unexpected calcification.

Skull: No acute calvarial abnormality.

Sinuses/Orbits: No acute finding.

Other: None
IMPRESSION: No intracranial abnormality.

## 2020-06-13 IMAGING — US IR ABDOMEN US LIMITED
1 series · 4 of 4 positions shown · non-contrast
Comparison: 12/12/2015

CLINICAL DATA: Evaluate for ascites and paracentesis.

EXAM:
LIMITED ABDOMEN ULTRASOUND FOR ASCITES
TECHNIQUE: Limited ultrasound survey for ascites was performed in all four
abdominal quadrants.

[Series 1: ir (id) (id)/(id)/(id) ir · 4 of 4 slices shown]
[im 1/4]
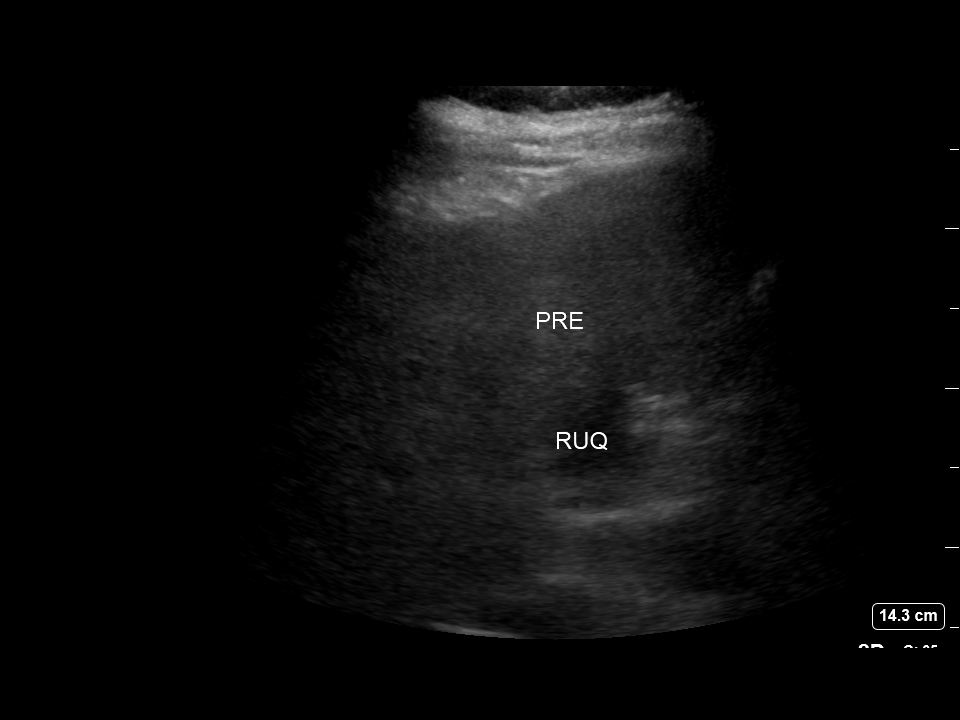
[im 2/4]
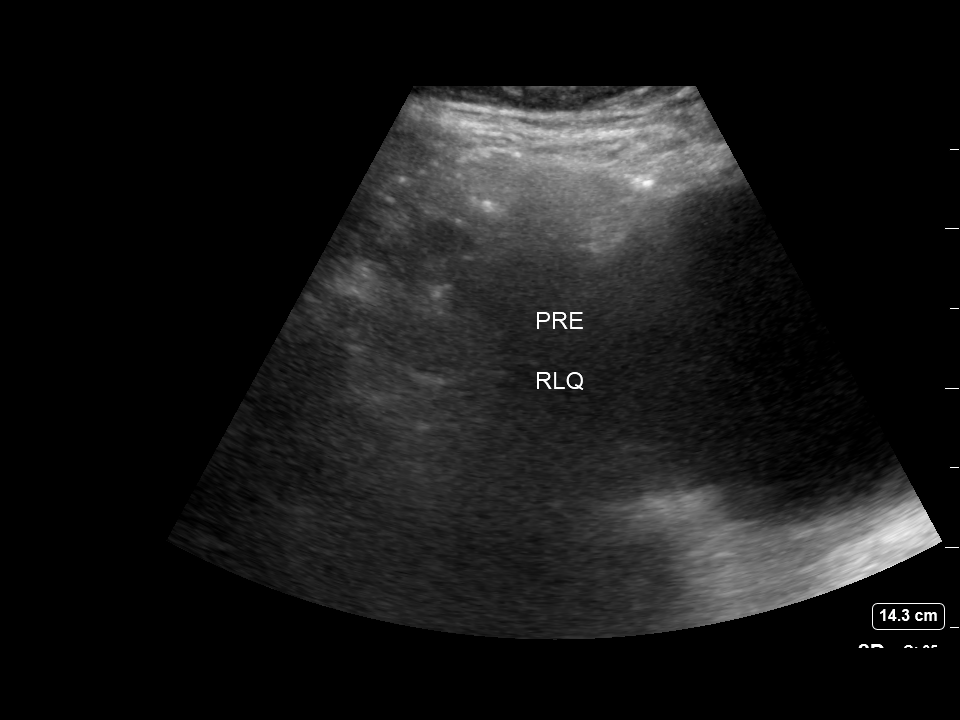
[im 3/4]
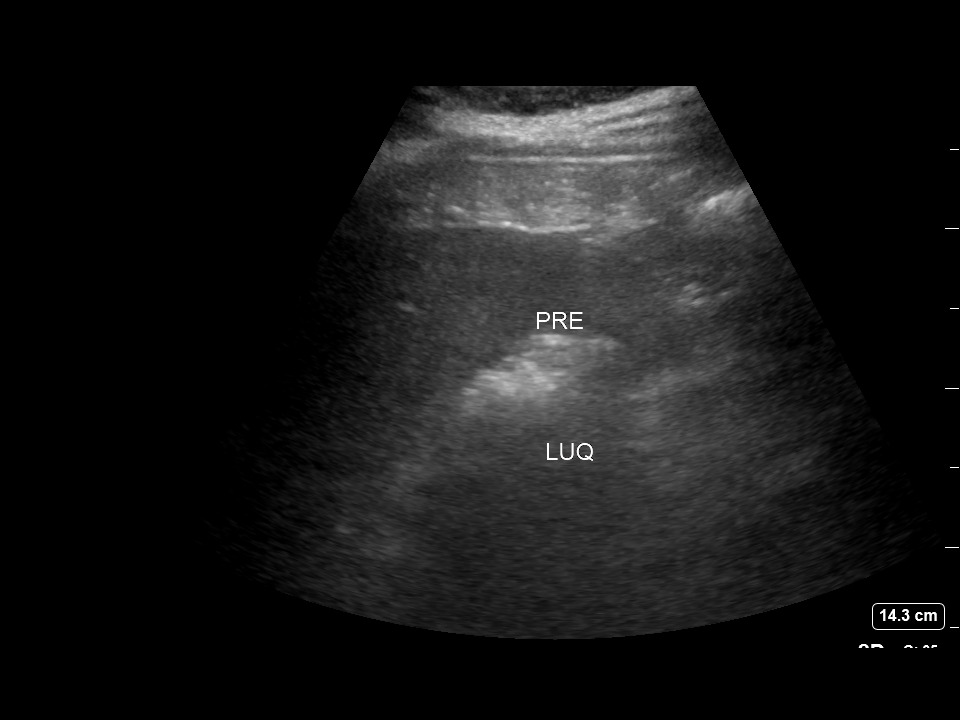
[im 4/4]
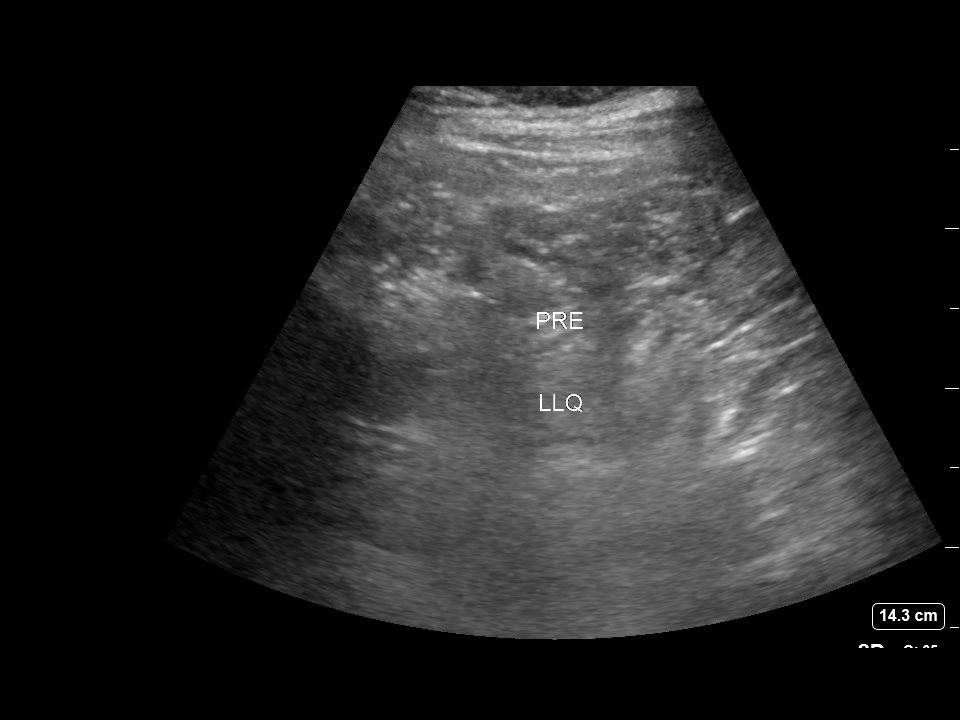

[4 of 4 positions shown; findings below may reference images not displayed]

FINDINGS: No ascites identified in the 4 quadrants of the abdomen.
Fluid-filled bladder identified in the right lower quadrant.
IMPRESSION: No ascites identified.
